# Patient Record
Sex: Female | Born: 1970 | Race: White | Hispanic: No | Marital: Married | Smoking: Former smoker
Health system: Southern US, Community
[De-identification: ages and names within clinical notes are randomized; demographics above are authoritative.]

## PROBLEM LIST (undated history)

## (undated) DIAGNOSIS — G510 Bell's palsy: Secondary | ICD-10-CM

## (undated) DIAGNOSIS — F419 Anxiety disorder, unspecified: Secondary | ICD-10-CM

## (undated) HISTORY — PX: FOOT SURGERY: SHX648

## (undated) HISTORY — PX: TONSILLECTOMY: SUR1361

---

## 2009-04-12 ENCOUNTER — Ambulatory Visit: Payer: Self-pay | Admitting: Internal Medicine

## 2010-05-27 ENCOUNTER — Ambulatory Visit: Payer: Self-pay | Admitting: Internal Medicine

## 2011-06-07 ENCOUNTER — Ambulatory Visit: Payer: Self-pay | Admitting: Internal Medicine

## 2011-06-13 ENCOUNTER — Ambulatory Visit: Payer: Self-pay

## 2012-10-17 DIAGNOSIS — F988 Other specified behavioral and emotional disorders with onset usually occurring in childhood and adolescence: Secondary | ICD-10-CM | POA: Insufficient documentation

## 2013-12-03 DIAGNOSIS — G4709 Other insomnia: Secondary | ICD-10-CM | POA: Insufficient documentation

## 2014-05-01 ENCOUNTER — Ambulatory Visit: Payer: Self-pay | Admitting: Emergency Medicine

## 2015-06-29 ENCOUNTER — Ambulatory Visit
Admission: EM | Admit: 2015-06-29 | Discharge: 2015-06-29 | Disposition: A | Discharge status: Hospice | Payer: BC Managed Care – PPO | Attending: Family Medicine | Admitting: Family Medicine

## 2015-06-29 ENCOUNTER — Encounter: Payer: Self-pay | Admitting: Emergency Medicine

## 2015-06-29 DIAGNOSIS — R05 Cough: Secondary | ICD-10-CM | POA: Diagnosis not present

## 2015-06-29 DIAGNOSIS — R059 Cough, unspecified: Secondary | ICD-10-CM

## 2015-06-29 HISTORY — DX: Bell's palsy: G51.0

## 2015-06-29 MED ORDER — AZITHROMYCIN 250 MG PO TABS: ORAL_TABLET | ORAL | Status: DC

## 2015-06-29 NOTE — ED Provider Notes (Signed)
CSN: [id number]     Arrival date & time 06/29/15  1042 History   First MD Initiated Contact with Patient 06/29/15 1216     Chief Complaint  Patient presents with  . Cough   (Consider location/radiation/quality/duration/timing/severity/associated sxs/prior Treatment) Patient is a 45 y.o. female presenting with cough and URI. The history is provided by the patient.  Cough Cough characteristics:  Productive Associated symptoms: no headaches and no wheezing   URI Presenting symptoms: cough and fatigue   Severity:  Moderate Onset quality:  Sudden Duration:  5 days Timing:  Constant Progression:  Worsening Chronicity:  New Relieved by:  Nothing Ineffective treatments:  OTC medications Associated symptoms: no arthralgias, no headaches, no sinus pain and no wheezing   Risk factors: sick contacts   Risk factors: not elderly, no chronic cardiac disease, no chronic kidney disease, no chronic respiratory disease, no diabetes mellitus, no immunosuppression, no recent illness and no recent travel     Past Medical History  Diagnosis Date  . Bell's palsy    Past Surgical History  Procedure Laterality Date  . Foot surgery Bilateral   . Cesarean section     History reviewed. No pertinent family history. Social History  Substance Use Topics  . Smoking status: Former Games developer  . Smokeless tobacco: None  . Alcohol Use: Yes   OB History    No data available     Review of Systems  Constitutional: Positive for fatigue.  Respiratory: Positive for cough. Negative for wheezing.   Musculoskeletal: Negative for arthralgias.  Neurological: Negative for headaches.    Allergies  Augmentin  Home Medications   Prior to Admission medications   Medication Sig Start Date End Date Taking? Authorizing Provider  amphetamine-dextroamphetamine (ADDERALL XR) 5 MG 24 hr capsule Take 5 mg by mouth daily.   Yes Historical Provider, MD  sertraline (ZOLOFT) 100 MG tablet Take 200 mg by mouth daily.   Yes  Historical Provider, MD  azithromycin (ZITHROMAX Z-PAK) 250 MG tablet 2 tabs po once day 1, then 1 tab po qd for next 4 days 06/29/15   Patricia Mccallum, MD   Meds Ordered and Administered this Visit  Medications - No data to display  BP 109/76 mmHg  Pulse 77  Temp(Src) 97 F (36.1 C) (Tympanic)  Resp 17  Ht  (1.575 m)  Wt 145 lb (65.772 kg)  BMI 26.51 kg/m2  SpO2 99%  LMP 06/26/2015 (Exact Date) No data found.   Physical Exam  Constitutional: She appears well-developed and well-nourished. No distress.  HENT:  Head: Normocephalic and atraumatic.  Right Ear: Tympanic membrane, external ear and ear canal normal.  Left Ear: Tympanic membrane, external ear and ear canal normal.  Nose: No mucosal edema, rhinorrhea, nose lacerations, sinus tenderness, nasal deformity, septal deviation or nasal septal hematoma. No epistaxis.  No foreign bodies.  Mouth/Throat: Uvula is midline, oropharynx is clear and moist and mucous membranes are normal. No oropharyngeal exudate.  Eyes: Conjunctivae and EOM are normal. Pupils are equal, round, and reactive to light. Right eye exhibits no discharge. Left eye exhibits no discharge. No scleral icterus.  Neck: Normal range of motion. Neck supple. No thyromegaly present.  Cardiovascular: Normal rate, regular rhythm and normal heart sounds.   Pulmonary/Chest: Effort normal and breath sounds normal. No respiratory distress. She has no wheezes. She has no rales.  Diffuse rhonchi  Lymphadenopathy:    She has no cervical adenopathy.  Skin: She is not diaphoretic.  Nursing note and vitals reviewed.  ED Course  Procedures (including critical care time)  Labs Review Labs Reviewed - No data to display  Imaging Review No results found.   Visual Acuity Review  Right Eye Distance:   Left Eye Distance:   Bilateral Distance:    Right Eye Near:   Left Eye Near:    Bilateral Near:         MDM   1. Cough    Discharge Medication List as of  06/29/2015 12:35 PM    START taking these medications   Details  azithromycin (ZITHROMAX Z-PAK) 250 MG tablet 2 tabs po once day 1, then 1 tab po qd for next 4 days, Normal       1. Labs/x-ray results and diagnosis reviewed with patient 2. rx as per orders above; reviewed possible side effects, interactions, risks and benefits  3. Recommend supportive treatment with increased fluids, otc analgesics, rest 4. Follow-up prn if symptoms worsen or don't improve    Patricia Mccallumrlando Batya Citron, MD 06/29/15 1303

## 2015-06-29 NOTE — ED Notes (Signed)
Patient c/o cough and chest congestion, SOB that started Thursday.  Patient reports fevers.

## 2016-03-01 ENCOUNTER — Ambulatory Visit
Admission: EM | Admit: 2016-03-01 | Discharge: 2016-03-01 | Disposition: A | Discharge status: Home / Self Care | Payer: BC Managed Care – PPO | Attending: Emergency Medicine | Admitting: Emergency Medicine

## 2016-03-01 ENCOUNTER — Encounter: Payer: Self-pay | Admitting: *Deleted

## 2016-03-01 DIAGNOSIS — J0100 Acute maxillary sinusitis, unspecified: Secondary | ICD-10-CM

## 2016-03-01 DIAGNOSIS — J029 Acute pharyngitis, unspecified: Secondary | ICD-10-CM | POA: Diagnosis not present

## 2016-03-01 LAB — RAPID STREP SCREEN (MED CTR MEBANE ONLY): Streptococcus, Group A Screen (Direct): NEGATIVE

## 2016-03-01 MED ORDER — IPRATROPIUM BROMIDE 0.06 % NA SOLN: 2.0000 | Freq: Four times a day (QID) | NASAL | 0 refills | Status: DC

## 2016-03-01 MED ORDER — IBUPROFEN 800 MG PO TABS: 800.0000 mg | ORAL_TABLET | Freq: Three times a day (TID) | ORAL | 0 refills | Status: DC

## 2016-03-01 MED ORDER — AZITHROMYCIN 500 MG PO TABS: 500.0000 mg | ORAL_TABLET | Freq: Every day | ORAL | 0 refills | Status: AC

## 2016-03-01 NOTE — ED Triage Notes (Signed)
Sore throat, fever, productive cough- yellow/bloody, body aches, chest soreness with coughing, since last week. Lost her voice yesterday.

## 2016-03-01 NOTE — Discharge Instructions (Signed)
your rapid strep was negative today, so we have sent off a throat culture.  1 gram of  Tylenol and ibuprofen together as needed for pain.  Make sure you drink plenty of extra fluids.  Some people find salt water gargles and  Traditional Medicinal's "Throat Coat" tea helpful. Take 5 mL of liquid Benadryl and 5 mL of Maalox. Mix it together, and then hold it in your mouth for as long as you can and then swallow. You may do this 4 times a day.  '  Start some saline nasal irrigation help with the nasal congestion and postnasal drip. Atrovent nasal spray will help with this as well.

## 2016-03-01 NOTE — ED Provider Notes (Signed)
HPI  SUBJECTIVE:  Patricia Patton is a 45 y.o. female who presents with 6 days of sore throat, laryngitis, nasal congestion, thick yellow rhinorrhea, postnasal drip, fevers to MAXIMUM TEMPERATURE 101, mild headaches. She states that she was overall getting better, but states her throat got acutely worse today. She reports sinus pain and pressure but states that this has since resolved. She reports cough productive of yellowish material same as her nasal congestion. She denies wheezing, chest tightness, shortness of breath. She reports chest soreness to the coughing. Bodyaches starting today. No difficulty breathing, sensation of throat swelling shut, drooling, trismus. No exposure to contacts with flu, mono, strep. States that she is sleeping okay at night. She tried Advil sinus Cepacol salt water gargles, with improvement in her symptoms, . There are no aggravating factors. No history of asthma, emphysema, COPD, diabetes, hypertension. She has a history of recurrent strep throat with negative rapid streps. LMP: "Months". She denies the chance of being pregnant. States that she is perimenopausal. PMD: [facility].    Past Medical History:  Diagnosis Date  . Bell's palsy     Past Surgical History:  Procedure Laterality Date  . CESAREAN SECTION    . FOOT SURGERY Bilateral     History reviewed. No pertinent family history.  Social History  Substance Use Topics  . Smoking status: Former Games developermoker  . Smokeless tobacco: Never Used  . Alcohol use Yes    No current facility-administered medications for this encounter.   Current Outpatient Prescriptions:  .  sertraline (ZOLOFT) 100 MG tablet, Take 200 mg by mouth daily., Disp: , Rfl:  .  azithromycin (ZITHROMAX) 500 MG tablet, Take 1 tablet (500 mg total) by mouth daily., Disp: 5 tablet, Rfl: 0 .  ibuprofen (ADVIL,MOTRIN) 800 MG tablet, Take 1 tablet (800 mg total) by mouth 3 (three) times daily., Disp: 30 tablet, Rfl: 0 .   ipratropium (ATROVENT) 0.06 % nasal spray, Place 2 sprays into both nostrils 4 (four) times daily. 3-4 times/ day, Disp: 15 mL, Rfl: 0  Allergies  Allergen Reactions  . Augmentin [Amoxicillin-Pot Clavulanate] Hives     ROS  As noted in HPI.   Physical Exam  BP 117/80 (BP Location: Left Arm)   Pulse 79   Temp 97.9 F (36.6 C) (Oral)   Resp 16   Ht 5\' 2"  (1.575 m)   Wt 145 lb (65.8 kg)   SpO2 98%   BMI 26.52 kg/m   Constitutional: Well developed, well nourished, no acute distress Eyes:  EOMI, conjunctiva normal bilaterally HENT: Normocephalic, atraumatic,mucus membranes moist. TM normal bilaterally. + Mucopurulent nasal d/c, + maxillary ST, red swollen erythematous tonsil R side with no soft palate tenderness or swelling. No exudate. Uvula midline. Positives shotty cervical lymphadenopathy Respiratory: Normal inspiratory effort, lungs clear bilaterally good air movement  Cardiovascular: Normal rate Regular rhythm, no murmurs rubs gallops  GI: nondistended skin: No rash, skin intact Musculoskeletal: no deformities Neurologic: Alert & oriented x 3, no focal neuro deficits Psychiatric: Speech and behavior appropriate   ED Course   Medications - No data to display  Orders Placed This Encounter  Procedures  . Rapid strep screen    Standing Status:   Standing    Number of Occurrences:   1    Order Specific Question:   Patient immune status    Answer:   Normal  . Culture, group A strep    Standing Status:   Standing    Number of Occurrences:  1    Results for orders placed or performed during the hospital encounter of 03/01/16 (from the past 24 hour(s))  Rapid strep screen     Status: None   Collection Time: 03/01/16  8:34 AM  Result Value Ref Range   Streptococcus, Group A Screen (Direct) NEGATIVE NEGATIVE   No results found.  ED Clinical Impression  Pharyngitis, unspecified etiology  Acute maxillary sinusitis, recurrence not specified   ED  Assessment/Plan  Rapid strep negative. Throat culture sent. However, because patient states that she gets recurrent strep throat with negative rapid streps, we'll treat empirically for strep throat azithromycin 500 mg daily for 5 days especially with fever and double sickening. Will also Cover for sinusitis with her sinus tenderness. Does not appear to be any evidence of retropharyngeal abscess or peritonsillar abscess at this time. Also supportive treatment with daily nasal irrigation, ibuprofen 800 mg with 1 g Tylenol 3 times a day, Benadryl/Maalox mixture. Follow-up with PMD as needed. To the ER if gets worse. Discussed labs MDM, plan and followup with patient. Discussed sn/sx that should prompt return to the ED. Patient agrees with plan.   Meds ordered this encounter  Medications  . ibuprofen (ADVIL,MOTRIN) 800 MG tablet    Sig: Take 1 tablet (800 mg total) by mouth 3 (three) times daily.    Dispense:  30 tablet    Refill:  0  . azithromycin (ZITHROMAX) 500 MG tablet    Sig: Take 1 tablet (500 mg total) by mouth daily.    Dispense:  5 tablet    Refill:  0  . ipratropium (ATROVENT) 0.06 % nasal spray    Sig: Place 2 sprays into both nostrils 4 (four) times daily. 3-4 times/ day    Dispense:  15 mL    Refill:  0    *This clinic note was created using Scientist, clinical (histocompatibility and immunogenetics)Dragon dictation software. Therefore, there may be occasional mistakes despite careful proofreading.  ?   Domenick GongAshley Billie Intriago, MD 03/01/16 [phone]

## 2016-03-03 LAB — CULTURE, GROUP A STREP (THRC)

## 2016-03-14 DIAGNOSIS — J020 Streptococcal pharyngitis: Secondary | ICD-10-CM

## 2016-03-14 HISTORY — DX: Streptococcal pharyngitis: J02.0

## 2016-04-15 ENCOUNTER — Ambulatory Visit
Admission: EM | Admit: 2016-04-15 | Discharge: 2016-04-15 | Disposition: A | Discharge status: Home / Self Care | Payer: BC Managed Care – PPO | Attending: Internal Medicine | Admitting: Internal Medicine

## 2016-04-15 ENCOUNTER — Encounter: Payer: Self-pay | Admitting: Emergency Medicine

## 2016-04-15 DIAGNOSIS — R6889 Other general symptoms and signs: Secondary | ICD-10-CM | POA: Diagnosis not present

## 2016-04-15 MED ORDER — OSELTAMIVIR PHOSPHATE 75 MG PO CAPS: 75.0000 mg | ORAL_CAPSULE | Freq: Two times a day (BID) | ORAL | 0 refills | Status: DC

## 2016-04-15 MED ORDER — CETIRIZINE-PSEUDOEPHEDRINE ER 5-120 MG PO TB12: 1.0000 | ORAL_TABLET | Freq: Two times a day (BID) | ORAL | 0 refills | Status: DC

## 2016-04-15 MED ORDER — BENZONATATE 200 MG PO CAPS: 200.0000 mg | ORAL_CAPSULE | Freq: Three times a day (TID) | ORAL | 0 refills | Status: DC | PRN

## 2016-04-15 NOTE — ED Triage Notes (Signed)
Pt reports flu like symptoms including cough, congestion, body aches, and fever that started last night

## 2016-04-15 NOTE — ED Provider Notes (Signed)
CSN: [id number]     Arrival date & time 04/15/16  0919 History   First MD Initiated Contact with Patient 04/15/16 1023     Chief Complaint  Patient presents with  . Influenza   (Consider location/radiation/quality/duration/timing/severity/associated sxs/prior Treatment) HPI  Is a 46 year old female who presents with a sudden onset of coughing gesture and body aches and fever to 101 this started last night. She did receive a flu shot in October. Also complaining of bladder fullness but not having to pass urine. She states it is not a UTI she is very familiar with those symptoms.       Past Medical History:  Diagnosis Date  . Bell's palsy    Past Surgical History:  Procedure Laterality Date  . CESAREAN SECTION    . FOOT SURGERY Bilateral    No family history on file. Social History  Substance Use Topics  . Smoking status: Former Games developermoker  . Smokeless tobacco: Never Used  . Alcohol use Yes   OB History    No data available     Review of Systems  Constitutional: Positive for activity change, chills, fatigue and fever.  HENT: Positive for congestion, postnasal drip, sinus pain and sinus pressure.   Respiratory: Positive for cough. Negative for shortness of breath, wheezing and stridor.   Genitourinary: Positive for decreased urine volume and urgency.  All other systems reviewed and are negative.   Allergies  Augmentin [amoxicillin-pot clavulanate]  Home Medications   Prior to Admission medications   Medication Sig Start Date End Date Taking? Authorizing Provider  benzonatate (TESSALON) 200 MG capsule Take 1 capsule (200 mg total) by mouth 3 (three) times daily as needed for cough. 04/15/16   Lutricia FeilWilliam P Roemer, PA-C  cetirizine-pseudoephedrine (ZYRTEC-D) 5-120 MG tablet Take 1 tablet by mouth 2 (two) times daily. 04/15/16   Lutricia FeilWilliam P Roemer, PA-C  ibuprofen (ADVIL,MOTRIN) 800 MG tablet Take 1 tablet (800 mg total) by mouth 3 (three) times daily. 03/01/16   Domenick GongAshley Mortenson, MD   ipratropium (ATROVENT) 0.06 % nasal spray Place 2 sprays into both nostrils 4 (four) times daily. 3-4 times/ day 03/01/16   Domenick GongAshley Mortenson, MD  oseltamivir (TAMIFLU) 75 MG capsule Take 1 capsule (75 mg total) by mouth every 12 (twelve) hours. 04/15/16   Lutricia FeilWilliam P Roemer, PA-C  sertraline (ZOLOFT) 100 MG tablet Take 200 mg by mouth daily.    Historical Provider, MD   Meds Ordered and Administered this Visit  Medications - No data to display  BP 123/76 (BP Location: Left Arm)   Pulse 84   Temp 99.9 F (37.7 C) (Oral)   Resp 16   Ht 5\' 2"  (1.575 m)   Wt 145 lb (65.8 kg)   SpO2 100%   BMI 26.52 kg/m  No data found.   Physical Exam  Constitutional: She is oriented to person, place, and time. She appears well-developed and well-nourished. No distress.  HENT:  Head: Normocephalic and atraumatic.  Right Ear: External ear normal.  Left Ear: External ear normal.  Nose: Nose normal.  Mouth/Throat: Oropharynx is clear and moist. No oropharyngeal exudate.  Eyes: EOM are normal. Pupils are equal, round, and reactive to light. Right eye exhibits no discharge. Left eye exhibits no discharge.  Neck: Normal range of motion. Neck supple.  Pulmonary/Chest: Effort normal and breath sounds normal. No respiratory distress. She has no wheezes. She has no rales.  Musculoskeletal: Normal range of motion.  Lymphadenopathy:    She has no cervical adenopathy.  Neurological:  She is alert and oriented to person, place, and time.  Skin: Skin is warm and dry. She is not diaphoretic.  Psychiatric: She has a normal mood and affect. Her behavior is normal. Judgment and thought content normal.  Nursing note and vitals reviewed.   Urgent Care Course     Procedures (including critical care time)  Labs Review Labs Reviewed - No data to display  Imaging Review No results found.   Visual Acuity Review  Right Eye Distance:   Left Eye Distance:   Bilateral Distance:    Right Eye Near:   Left Eye  Near:    Bilateral Near:         MDM   1. Flu-like symptoms    Discharge Medication List as of 04/15/2016 10:38 AM    START taking these medications   Details  benzonatate (TESSALON) 200 MG capsule Take 1 capsule (200 mg total) by mouth 3 (three) times daily as needed for cough., Starting Fri 04/15/2016, Normal    cetirizine-pseudoephedrine (ZYRTEC-D) 5-120 MG tablet Take 1 tablet by mouth 2 (two) times daily., Starting Fri 04/15/2016, Normal    oseltamivir (TAMIFLU) 75 MG capsule Take 1 capsule (75 mg total) by mouth every 12 (twelve) hours., Starting Fri 04/15/2016, Normal      Plan: 1. Test/x-ray results and diagnosis reviewed with patient 2. rx as per orders; risks, benefits, potential side effects reviewed with patient 3. Recommend supportive treatment with Rest and fluids. Tylenol or Motrin for aches pains and fever. Recommended the Zyrtec-D for congestion. Do not return to work until fever has subsided. 4. F/u prn if symptoms worsen or don't improve     Lutricia Feil, PA-C 04/15/16 1051

## 2016-08-14 ENCOUNTER — Ambulatory Visit (INDEPENDENT_AMBULATORY_CARE_PROVIDER_SITE_OTHER): Payer: BC Managed Care – PPO

## 2016-08-14 ENCOUNTER — Encounter: Payer: Self-pay | Admitting: *Deleted

## 2016-08-14 ENCOUNTER — Ambulatory Visit
Admission: EM | Admit: 2016-08-14 | Discharge: 2016-08-14 | Disposition: A | Discharge status: Home / Self Care | Payer: BC Managed Care – PPO | Attending: Family Medicine | Admitting: Family Medicine

## 2016-08-14 DIAGNOSIS — S93401A Sprain of unspecified ligament of right ankle, initial encounter: Secondary | ICD-10-CM

## 2016-08-14 MED ORDER — NAPROXEN 500 MG PO TABS: 500.0000 mg | ORAL_TABLET | Freq: Two times a day (BID) | ORAL | 0 refills | Status: DC

## 2016-08-14 NOTE — ED Provider Notes (Signed)
CSN: [id number]     Arrival date & time 08/14/16  1108 History   First MD Initiated Contact with Patient 08/14/16 1149     Chief Complaint  Patient presents with  . Ankle Pain   (Consider location/radiation/quality/duration/timing/severity/associated sxs/prior Treatment) Patient is a 46 year old female who presents with complaint of right ankle pain. Patient states she twisted her ankle about a week ago and that her pain swelling had actually improved. She had been taking aspirin for the pain. Patient reports, however that the pain returned today and that  it is hurting to walk. Patient does report throbbing pain at rest. Patient states she has been using elevation, ice, perhaps, and rest for her ankle pain.       Past Medical History:  Diagnosis Date  . Bell's palsy    Past Surgical History:  Procedure Laterality Date  . CESAREAN SECTION    . FOOT SURGERY Bilateral    History reviewed. No pertinent family history. Social History  Substance Use Topics  . Smoking status: Former Games developermoker  . Smokeless tobacco: Never Used  . Alcohol use Yes   OB History    No data available     Review of Systems  Musculoskeletal: Positive for arthralgias and joint swelling.       As noted above in history of present illness  All other systems reviewed and are negative.   Allergies  Augmentin [amoxicillin-pot clavulanate]  Home Medications   Prior to Admission medications   Medication Sig Start Date End Date Taking? Authorizing Provider  sertraline (ZOLOFT) 100 MG tablet Take 200 mg by mouth daily.   Yes [provider]  benzonatate (TESSALON) 200 MG capsule Take 1 capsule (200 mg total) by mouth 3 (three) times daily as needed for cough. 04/15/16   Lutricia Feiloemer, William P, PA-C  cetirizine-pseudoephedrine (ZYRTEC-D) 5-120 MG tablet Take 1 tablet by mouth 2 (two) times daily. 04/15/16   Lutricia Feiloemer, William P, PA-C  ibuprofen (ADVIL,MOTRIN) 800 MG tablet Take 1 tablet (800 mg total) by mouth 3  (three) times daily. 03/01/16   Domenick GongMortenson, Ashley, MD  ipratropium (ATROVENT) 0.06 % nasal spray Place 2 sprays into both nostrils 4 (four) times daily. 3-4 times/ day 03/01/16   Domenick GongMortenson, Ashley, MD  naproxen (NAPROSYN) 500 MG tablet Take 1 tablet (500 mg total) by mouth 2 (two) times daily with a meal. 08/14/16   Candis SchatzHarris, Kennth Vanbenschoten D, PA-C  oseltamivir (TAMIFLU) 75 MG capsule Take 1 capsule (75 mg total) by mouth every 12 (twelve) hours. 04/15/16   Lutricia Feiloemer, William P, PA-C   Meds Ordered and Administered this Visit  Medications - No data to display  BP 118/82 (BP Location: Left Arm)   Pulse 78   Temp 99.2 F (37.3 C) (Oral)   Resp 18   Ht 5\' 2"  (1.575 m)   Wt 150 lb (68 kg)   LMP 06/14/2016   SpO2 98%   BMI 27.44 kg/m  No data found.   Physical Exam  Constitutional: She appears well-developed and well-nourished.  HENT:  Head: Normocephalic and atraumatic.  Eyes: EOM are normal. Pupils are equal, round, and reactive to light.  Neck: Normal range of motion.  Cardiovascular: Normal rate.   Pulmonary/Chest: Effort normal.  Musculoskeletal:       Right ankle: She exhibits swelling. Tenderness. Lateral malleolus tenderness found. Achilles tendon normal. Achilles tendon exhibits no pain and no defect.       Feet:    Urgent Care Course     Procedures (including  critical care time)  Labs Review Labs Reviewed - No data to display  Imaging Review Dg Ankle Complete Right  Result Date: 08/14/2016 CLINICAL DATA:  Right ankle pain following fall 1 week ago. Initial encounter. EXAM: RIGHT ANKLE - COMPLETE 3+ VIEW COMPARISON:  None. FINDINGS: There is no evidence of fracture, dislocation, or joint effusion. There is no evidence of arthropathy or other focal bone abnormality. Soft tissues are unremarkable. A surgical screw within the first metatarsal is noted. IMPRESSION: No acute abnormality. Electronically Signed   By: Harmon Pier M.D.   On: 08/14/2016 12:26        MDM   1. Sprain  of right ankle, unspecified ligament, initial encounter    New Prescriptions   NAPROXEN (NAPROSYN) 500 MG TABLET    Take 1 tablet (500 mg total) by mouth 2 (two) times daily with a meal.   Patient with ankle sprain based on exam with tenderness to lateral malleolus associated ligaments. Xray negative for fracture. RICE. Stirrup brace placed. Prescription given for naproxen sodium for pain.  Candis Schatz, PA-C      Candis Schatz, PA-C 08/14/16 1254

## 2016-08-14 NOTE — ED Triage Notes (Signed)
Patient stepped into a hole twisting her right ankle 1 week ago. Swelling has resolved, with additional symptom of sharp pain starting today.

## 2016-08-14 NOTE — ED Notes (Signed)
ASO brace applied to right ankle

## 2016-08-14 NOTE — Discharge Instructions (Signed)
-  continue with ice, elevation and rest -naproxen twice daily as needed for pain -see attached for brace

## 2017-07-02 ENCOUNTER — Other Ambulatory Visit: Payer: Self-pay

## 2017-07-02 ENCOUNTER — Ambulatory Visit
Admission: EM | Admit: 2017-07-02 | Discharge: 2017-07-02 | Disposition: A | Discharge status: Home / Self Care | Payer: BC Managed Care – PPO | Attending: Emergency Medicine | Admitting: Emergency Medicine

## 2017-07-02 ENCOUNTER — Encounter: Payer: Self-pay | Admitting: Gynecology

## 2017-07-02 DIAGNOSIS — J029 Acute pharyngitis, unspecified: Secondary | ICD-10-CM

## 2017-07-02 DIAGNOSIS — R110 Nausea: Secondary | ICD-10-CM

## 2017-07-02 DIAGNOSIS — R109 Unspecified abdominal pain: Secondary | ICD-10-CM | POA: Diagnosis not present

## 2017-07-02 DIAGNOSIS — J020 Streptococcal pharyngitis: Secondary | ICD-10-CM | POA: Diagnosis not present

## 2017-07-02 HISTORY — DX: Anxiety disorder, unspecified: F41.9

## 2017-07-02 LAB — RAPID STREP SCREEN (MED CTR MEBANE ONLY): Streptococcus, Group A Screen (Direct): POSITIVE — AB

## 2017-07-02 MED ORDER — ONDANSETRON 8 MG PO TBDP
8.0000 mg | ORAL_TABLET | Freq: Once | ORAL | Status: AC
  Administered 2017-07-02: 8 mg via ORAL

## 2017-07-02 MED ORDER — ACETAMINOPHEN 325 MG PO TABS
650.0000 mg | ORAL_TABLET | Freq: Once | ORAL | Status: AC
  Administered 2017-07-02: 650 mg via ORAL

## 2017-07-02 MED ORDER — AZITHROMYCIN 250 MG PO TABS: 250.0000 mg | ORAL_TABLET | Freq: Every day | ORAL | 0 refills | Status: DC

## 2017-07-02 MED ORDER — ONDANSETRON 8 MG PO TBDP: 8.0000 mg | ORAL_TABLET | Freq: Two times a day (BID) | ORAL | 0 refills | Status: DC

## 2017-07-02 NOTE — ED Triage Notes (Signed)
Patient c/o painful to swallow and stomach pain x yesterday.

## 2017-07-02 NOTE — ED Provider Notes (Signed)
[facility]    CSN: [id number] Arrival date & time: 07/02/17  0848     History   Chief Complaint Chief Complaint  Patient presents with  . Sore Throat    HPI Patricia Patton is a 47 y.o. female.   HPI  47 year old female who presents with sore throat very painful to swallow and abdominal pain she states mostly lower quadrant both left and right.  This started yesterday.  Her 48-year-old daughter had been sick with a fever but no other symptoms 2 days prior.  Also been around a child that had been confirmed influenza B last week.  He has had nausea but no vomiting has not had any diarrhea.  Her last bowel movement was yesterday which was normal.  She is passing gas without problem.  Vital signs temperature 100.2 pulse rate 110 blood pressure 110/75 respirations 16 O2 sats at 96% on room air         Past Medical History:  Diagnosis Date  . Anxiety   . Bell's palsy     There are no active problems to display for this patient.   Past Surgical History:  Procedure Laterality Date  . CESAREAN SECTION    . FOOT SURGERY Bilateral     OB History   None      Home Medications    Prior to Admission medications   Medication Sig Start Date End Date Taking? Authorizing Provider  cetirizine-pseudoephedrine (ZYRTEC-D) 5-120 MG tablet Take 1 tablet by mouth 2 (two) times daily. 04/15/16  Yes Lutricia Feil, PA-C  ibuprofen (ADVIL,MOTRIN) 800 MG tablet Take 1 tablet (800 mg total) by mouth 3 (three) times daily. 03/01/16  Yes Domenick Gong, MD  ipratropium (ATROVENT) 0.06 % nasal spray Place 2 sprays into both nostrils 4 (four) times daily. 3-4 times/ day 03/01/16  Yes Domenick Gong, MD  naproxen (NAPROSYN) 500 MG tablet Take 1 tablet (500 mg total) by mouth 2 (two) times daily with a meal. 08/14/16  Yes Candis Schatz, PA-C  sertraline (ZOLOFT) 100 MG tablet Take 200 mg by mouth daily.   Yes [provider]  azithromycin (ZITHROMAX) 250 MG  tablet Take 1 tablet (250 mg total) by mouth daily. Take first 2 tablets together, then 1 every day until finished. 07/02/17   Lutricia Feil, PA-C  ondansetron (ZOFRAN ODT) 8 MG disintegrating tablet Take 1 tablet (8 mg total) by mouth 2 (two) times daily. 07/02/17   Lutricia Feil, PA-C    Family History Family History  Problem Relation Age of Onset  . Diabetes Mother   . Diabetes Father     Social History Social History   Tobacco Use  . Smoking status: Former Games developer  . Smokeless tobacco: Never Used  Substance Use Topics  . Alcohol use: Yes  . Drug use: No     Allergies   Augmentin [amoxicillin-pot clavulanate]   Review of Systems Review of Systems  Constitutional: Positive for activity change, appetite change, chills, fatigue and fever.  HENT: Positive for sore throat and trouble swallowing.   Gastrointestinal: Positive for abdominal distention, abdominal pain and nausea. Negative for constipation, diarrhea and vomiting.  All other systems reviewed and are negative.    Physical Exam Triage Vital Signs ED Triage Vitals  Enc Vitals Group     BP 07/02/17 0858 110/75     Pulse Rate 07/02/17 0858 (!) 110     Resp 07/02/17 0858 16     Temp 07/02/17 0858 100.2  F (37.9 C)     Temp Source 07/02/17 0858 Oral     SpO2 07/02/17 0858 96 %     Weight 07/02/17 0858 150 lb (68 kg)     Height 07/02/17 0858 5\' 2"  (1.575 m)     Head Circumference --      Peak Flow --      Pain Score 07/02/17 0900 8     Pain Loc --      Pain Edu? --      Excl. in GC? --    No data found.  Updated Vital Signs BP 110/75 (BP Location: Left Arm)   Pulse (!) 110   Temp 100.2 F (37.9 C) (Oral)   Resp 16   Ht 5\' 2"  (1.575 m)   Wt 150 lb (68 kg)   SpO2 96%   BMI 27.44 kg/m   Visual Acuity Right Eye Distance:   Left Eye Distance:   Bilateral Distance:    Right Eye Near:   Left Eye Near:    Bilateral Near:     Physical Exam  Constitutional: She is oriented to person, place,  and time. She appears well-developed and well-nourished.  Non-toxic appearance. She does not appear ill. No distress.  HENT:  Head: Normocephalic.  Right Ear: Hearing and tympanic membrane normal.  Left Ear: Hearing and tympanic membrane normal.  Mouth/Throat: Mucous membranes are normal. No oral lesions. Oropharyngeal exudate and posterior oropharyngeal erythema present. No tonsillar abscesses. Tonsils are 1+ on the right. Tonsils are 1+ on the left. Tonsillar exudate.  Uvula is midline.  There is no pharyngeal swelling no peritonsillar abscess is identified  Eyes: Pupils are equal, round, and reactive to light.  Neck: Normal range of motion. Neck supple.  Pulmonary/Chest: Effort normal and breath sounds normal.  Abdominal: Soft. Bowel sounds are normal. She exhibits distension. There is tenderness. There is no rebound and no guarding.  Neurological: She is alert and oriented to person, place, and time.  Skin: Skin is warm and dry.  Psychiatric: She has a normal mood and affect. Her behavior is normal.  Nursing note and vitals reviewed.    UC Treatments / Results  Labs (all labs ordered are listed, but only abnormal results are displayed) Labs Reviewed  RAPID STREP SCREEN ([facility]) - Abnormal; Notable for the following components:      Result Value   Streptococcus, Group A Screen (Direct) POSITIVE (*)    All other components within normal limits    EKG None Radiology No results found.  Procedures Procedures (including critical care time)  Medications Ordered in UC Medications  ondansetron (ZOFRAN-ODT) disintegrating tablet 8 mg (8 mg Oral Given 07/02/17 0921)  acetaminophen (TYLENOL) tablet 650 mg (650 mg Oral Given 07/02/17 0921)     Initial Impression / Assessment and Plan / UC Course  I have reviewed the triage vital signs and the nursing notes.  Pertinent labs & imaging results that were available during my care of the patient were reviewed by me and considered  in my medical decision making (see chart for details).     Plan: 1. Test/x-ray results and diagnosis reviewed with patient 2. rx as per orders; risks, benefits, potential side effects reviewed with patient 3. Recommend supportive treatment with increased fluid.  She refused prednisone for the throat pain.  Since she has the allergy to amoxicillin  she has stated that she has taken other forms of penicillin I will provide her with azithromycin Z-Pak.  She  has had this in the past without any complications.  Zofran for nausea so that she may hydrate.  She may also use salt water gargles ad lib. or lozenges.  If she is not improving in 2 days have recommended she follow-up with her primary care physician or return to our clinic. 4. F/u prn if symptoms worsen or don't improve   Final Clinical Impressions(s) / UC Diagnoses   Final diagnoses:  Strep throat    ED Discharge Orders        Ordered    ondansetron (ZOFRAN ODT) 8 MG disintegrating tablet  2 times daily     07/02/17 0938    azithromycin (ZITHROMAX) 250 MG tablet  Daily     07/02/17 0938       Controlled Substance Prescriptions [place] Controlled Substance Registry consulted? Not Applicable   Lutricia Feil, PA-C 07/02/17 1610

## 2017-07-02 NOTE — Discharge Instructions (Addendum)
Drink plenty of fluids.  Use Motrin for fever sore throat and body aches.  If you have not improved in 2 days.  Recommend you follow-up with your primary care physician.

## 2018-06-27 DIAGNOSIS — F340 Cyclothymic disorder: Secondary | ICD-10-CM | POA: Insufficient documentation

## 2018-09-26 DIAGNOSIS — G4733 Obstructive sleep apnea (adult) (pediatric): Secondary | ICD-10-CM | POA: Insufficient documentation

## 2018-09-30 ENCOUNTER — Emergency Department
Admission: EM | Admit: 2018-09-30 | Discharge: 2018-09-30 | Disposition: A | Discharge status: Home / Self Care | Payer: BC Managed Care – PPO | Attending: Emergency Medicine | Admitting: Emergency Medicine

## 2018-09-30 ENCOUNTER — Ambulatory Visit: Payer: Self-pay

## 2018-09-30 ENCOUNTER — Encounter: Payer: Self-pay | Admitting: Emergency Medicine

## 2018-09-30 ENCOUNTER — Other Ambulatory Visit: Payer: Self-pay

## 2018-09-30 ENCOUNTER — Emergency Department: Payer: BC Managed Care – PPO

## 2018-09-30 DIAGNOSIS — R05 Cough: Secondary | ICD-10-CM | POA: Diagnosis not present

## 2018-09-30 DIAGNOSIS — Z79899 Other long term (current) drug therapy: Secondary | ICD-10-CM | POA: Diagnosis not present

## 2018-09-30 DIAGNOSIS — Z20828 Contact with and (suspected) exposure to other viral communicable diseases: Secondary | ICD-10-CM | POA: Insufficient documentation

## 2018-09-30 DIAGNOSIS — Z87891 Personal history of nicotine dependence: Secondary | ICD-10-CM | POA: Insufficient documentation

## 2018-09-30 DIAGNOSIS — R0602 Shortness of breath: Secondary | ICD-10-CM | POA: Insufficient documentation

## 2018-09-30 DIAGNOSIS — R0789 Other chest pain: Secondary | ICD-10-CM | POA: Diagnosis present

## 2018-09-30 DIAGNOSIS — R059 Cough, unspecified: Secondary | ICD-10-CM

## 2018-09-30 LAB — BASIC METABOLIC PANEL
Anion gap: 7 (ref 5–15)
BUN: 16 mg/dL (ref 6–20)
CO2: 29 mmol/L (ref 22–32)
Calcium: 9.1 mg/dL (ref 8.9–10.3)
Chloride: 108 mmol/L (ref 98–111)
Creatinine, Ser: 0.76 mg/dL (ref 0.44–1.00)
GFR calc Af Amer: >60 mL/min (ref >60)
GFR calc non Af Amer: >60 mL/min (ref >60)
Glucose, Bld: 113 mg/dL — ABNORMAL HIGH (ref 70–99)
Potassium: 4.2 mmol/L (ref 3.5–5.1)
Sodium: 144 mmol/L (ref 135–145)

## 2018-09-30 LAB — CBC
HCT: 39.7 % (ref 36.0–46.0)
Hemoglobin: 13.4 g/dL (ref 12.0–15.0)
MCH: 29.6 pg (ref 26.0–34.0)
MCHC: 33.8 g/dL (ref 30.0–36.0)
MCV: 87.8 fL (ref 80.0–100.0)
Platelets: 253 10*3/uL (ref 150–400)
RBC: 4.52 MIL/uL (ref 3.87–5.11)
RDW: 12.2 % (ref 11.5–15.5)
WBC: 5 10*3/uL (ref 4.0–10.5)
nRBC: 0 % (ref 0.0–0.2)

## 2018-09-30 LAB — URINALYSIS, ROUTINE W REFLEX MICROSCOPIC
Bilirubin Urine: NEGATIVE
Glucose, UA: NEGATIVE mg/dL
Ketones, ur: 5 mg/dL — AB
Leukocytes,Ua: NEGATIVE
Nitrite: NEGATIVE
Protein, ur: NEGATIVE mg/dL
Specific Gravity, Urine: 1.023 (ref 1.005–1.030)
pH: 5 (ref 5.0–8.0)

## 2018-09-30 LAB — TROPONIN I (HIGH SENSITIVITY): Troponin I (High Sensitivity): <2 ng/L (ref <18)

## 2018-09-30 LAB — PREGNANCY, URINE: Preg Test, Ur: NEGATIVE

## 2018-09-30 MED ORDER — KETOROLAC TROMETHAMINE 30 MG/ML IJ SOLN
30.0000 mg | Freq: Once | INTRAMUSCULAR | Status: AC
  Administered 2018-09-30: 16:00:00 30 mg via INTRAMUSCULAR
  Filled 2018-09-30: qty 1

## 2018-09-30 NOTE — ED Triage Notes (Signed)
Pt arrived via POV with reports of severe chest pain that started 2 weeks ago, and then congestion, diarrhea, mild headache and nausea that started yesterday.  Pt has had no known COVID contacts. Pt wearing mask on arrival.

## 2018-09-30 NOTE — Telephone Encounter (Signed)
Pt c/o of few weeks of covid symptoms, constant chest pain located at the center of her chest, fatigue, Diarrhea, nasal congestion, sore throat. Denies fever. Dry cough. Pt c/o SOB at rest. Pt denies any radiation of chest pain to jaw, left ar or neck. Pt given care advice and pt verbalized understanding.  Called angela who is Camera operator at [facility] and asked for pt instruction. Was advised to have pt park and to come in and make sure she is wearing a mask. Pt advised to go to ED alone. Pt verbalized understanding.      Reason for Disposition . SEVERE or constant chest pain or pressure (Exception: mild central chest pain, present only when coughing)  Answer Assessment - Initial Assessment Questions 1. COVID-19 DIAGNOSIS: "Who made your Coronavirus (COVID-19) diagnosis?" "Was it confirmed by a positive lab test?" If not diagnosed by a HCP, ask "Are there lots of cases (community spread) where you live?" (See public health department website, if unsure)     N/a-no-yes 2. ONSET: "When did the COVID-19 symptoms start?"      A few weeks ago 3. WORST SYMPTOM: "What is your worst symptom?" (e.g., cough, fever, shortness of breath, muscle aches)     Chest pain-mid chest, hurts with deep breath,constant 8/10 4. COUGH: "Do you have a cough?" If so, ask: "How bad is the cough?"       Yes-dry- occasional 5. FEVER: "Do you have a fever?" If so, ask: "What is your temperature, how was it measured, and when did it start?"     no 6. RESPIRATORY STATUS: "Describe your breathing?" (e.g., shortness of breath, wheezing, unable to speak)      SOB at rest, wheezing 7. BETTER-SAME-WORSE: "Are you getting better, staying the same or getting worse compared to yesterday?"  If getting worse, ask, "In what way?"     Worse- chest pain 8. HIGH RISK DISEASE: "Do you have any chronic medical problems?" (e.g., asthma, heart or lung disease, weak immune system, etc.)     obesity 9. PREGNANCY: "Is there any chance you are  pregnant?" "When was your last menstrual period?"     N/a- 10. OTHER SYMPTOMS: "Do you have any other symptoms?"  (e.g., chills, fatigue, headache, loss of smell or taste, muscle pain, sore throat)       Diarrhea, fatigue, nasal congestion, sore throat  Protocols used: CORONAVIRUS (COVID-19) DIAGNOSED OR SUSPECTED-A-AH

## 2018-09-30 NOTE — ED Provider Notes (Signed)
[facility] Emergency Department Provider Note  ____________________________________________   First MD Initiated Contact with Patient 09/30/18 1459     (approximate)  I have reviewed the triage vital signs and the nursing notes.   HISTORY  Chief Complaint Chest Pain, Diarrhea, and COVID Symptoms    HPI Patricia Patton is a 48 y.o. female with anxiety who presents with multiple symptoms.  Pt says she has had some substernal chest pain, that is moderate, constant, moves around her chest, nothing makes it better or worse.  Denies risk factors for ACS.  She then developed a few days ago multiple symptoms including diarrhea, headache, weakness, congestion, nausea, mild sob.  She is concerned she may have coronavirus.     Past Medical History:  Diagnosis Date  . Anxiety   . Bell's palsy     There are no active problems to display for this patient.   Past Surgical History:  Procedure Laterality Date  . CESAREAN SECTION    . FOOT SURGERY Bilateral     Prior to Admission medications   Medication Sig Start Date End Date Taking? Authorizing Provider  azithromycin (ZITHROMAX) 250 MG tablet Take 1 tablet (250 mg total) by mouth daily. Take first 2 tablets together, then 1 every day until finished. 07/02/17   Lutricia Feiloemer, William P, PA-C  cetirizine-pseudoephedrine (ZYRTEC-D) 5-120 MG tablet Take 1 tablet by mouth 2 (two) times daily. 04/15/16   Lutricia Feiloemer, William P, PA-C  ibuprofen (ADVIL,MOTRIN) 800 MG tablet Take 1 tablet (800 mg total) by mouth 3 (three) times daily. 03/01/16   Domenick GongMortenson, Ashley, MD  ipratropium (ATROVENT) 0.06 % nasal spray Place 2 sprays into both nostrils 4 (four) times daily. 3-4 times/ day 03/01/16   Domenick GongMortenson, Ashley, MD  naproxen (NAPROSYN) 500 MG tablet Take 1 tablet (500 mg total) by mouth 2 (two) times daily with a meal. 08/14/16   Candis SchatzHarris, Michael D, PA-C  ondansetron (ZOFRAN ODT) 8 MG disintegrating tablet Take 1 tablet (8 mg total)  by mouth 2 (two) times daily. 07/02/17   Lutricia Feiloemer, William P, PA-C  sertraline (ZOLOFT) 100 MG tablet Take 200 mg by mouth daily.    [provider]    Allergies Amoxicillin-pot clavulanate  Family History  Problem Relation Age of Onset  . Diabetes Mother   . Diabetes Father     Social History Social History   Tobacco Use  . Smoking status: Former Games developermoker  . Smokeless tobacco: Never Used  Substance Use Topics  . Alcohol use: Yes  . Drug use: No      Review of Systems Constitutional: No fever/chills Eyes: No visual changes. ENT: No sore throat. Cardiovascular: Positive chest pain Respiratory: Positive shortness of breath Gastrointestinal: No abdominal pain.  Positive nausea no vomiting.  Positive diarrhea.  No constipation. Genitourinary: Negative for dysuria.  Positive for increased urinary frequency Musculoskeletal: Negative for back pain. Skin: Negative for rash. Neurological: Positive for mild headache, no focal weakness or numbness. All other ROS negative ____________________________________________   PHYSICAL EXAM:  VITAL SIGNS: ED Triage Vitals  Enc Vitals Group     BP 09/30/18 1307 119/74     Pulse Rate 09/30/18 1307 72     Resp 09/30/18 1307 18     Temp 09/30/18 1307 98.5 F (36.9 C)     Temp Source 09/30/18 1307 Oral     SpO2 09/30/18 1307 98 %     Weight 09/30/18 1258 150 lb (68 kg)     Height 09/30/18 1258  5\' 2"  (1.575 m)     Head Circumference --      Peak Flow --      Pain Score 09/30/18 1257 8     Pain Loc --      Pain Edu? --      Excl. in GC? --     Constitutional: Alert and oriented. Well appearing and in no acute distress. Eyes: Conjunctivae are normal. EOMI. Head: Atraumatic. Nose: No congestion/rhinnorhea. Mouth/Throat: Mucous membranes are moist.   Neck: No stridor. Trachea Midline. FROM Cardiovascular: Normal rate, regular rhythm. Grossly normal heart sounds.  Good peripheral circulation. Respiratory: Normal respiratory  effort.  No retractions. Lungs CTAB. Gastrointestinal: Soft and nontender. No distention. No abdominal bruits.  Musculoskeletal: No lower extremity tenderness nor edema.  No joint effusions. Neurologic:  Normal speech and language. No gross focal neurologic deficits are appreciated.  Skin:  Skin is warm, dry and intact. No rash noted. Psychiatric: Mood and affect are normal. Speech and behavior are normal. GU: Deferred   ____________________________________________   LABS (all labs ordered are listed, but only abnormal results are displayed)  Labs Reviewed  BASIC METABOLIC PANEL - Abnormal; Notable for the following components:      Result Value   Glucose, Bld 113 (*)    All other components within normal limits  URINALYSIS, ROUTINE W REFLEX MICROSCOPIC - Abnormal; Notable for the following components:   Color, Urine YELLOW (*)    APPearance HAZY (*)    Hgb urine dipstick SMALL (*)    Ketones, ur 5 (*)    Bacteria, UA RARE (*)    All other components within normal limits  NOVEL CORONAVIRUS, NAA (HOSPITAL ORDER, SEND-OUT TO REF LAB)  CBC  PREGNANCY, URINE  TROPONIN I (HIGH SENSITIVITY)   ____________________________________________   ED ECG REPORT I, Concha SeMary E Teniqua Marron, the attending physician, personally viewed and interpreted this ECG.  EKG is normal sinus rate of 66, no ST elevation, nonspecific T wave inversion in lead III, normal intervals ____________________________________________  RADIOLOGY Vela ProseI, Carly Sabo E Tyniya Kuyper, personally viewed and evaluated these images (plain radiographs) as part of my medical decision making, as well as reviewing the written report by the radiologist.  ED MD interpretation: Chest x-ray reviewed without evidence of pneumonia.  Official radiology report(s): Dg Chest 1 View  Result Date: 09/30/2018 CLINICAL DATA:  Cough and intermittent chest pain for over a week. Nausea with some diarrhea. EXAM: CHEST  1 VIEW COMPARISON:  06/13/2011. FINDINGS: Normal  sized heart. Clear lungs. Cervical spine degenerative changes. IMPRESSION: No acute abnormality. Electronically Signed   By: Beckie SaltsSteven  Reid M.D.   On: 09/30/2018 14:50    ____________________________________________   PROCEDURES  Procedure(s) performed (including Critical Care):  Procedures   ____________________________________________   INITIAL IMPRESSION / ASSESSMENT AND PLAN / ED COURSE  Patricia Patton was evaluated in Emergency Department on 09/30/2018 for the symptoms described in the history of present illness. She was evaluated in the context of the global COVID-19 pandemic, which necessitated consideration that the patient might be at risk for infection with the SARS-CoV-2 virus that causes COVID-19. Institutional protocols and algorithms that pertain to the evaluation of patients at risk for COVID-19 are in a state of rapid change based on information released by regulatory bodies including the CDC and federal and state organizations. These policies and algorithms were followed during the patient's care in the ED.    Patient is a very well-appearing 48 year old with normal vital signs.  Patient chest pain is been  going on for multiple days.  Patient has no risk factors for ACS however given her age will get cardiac markers and EKG.  Patient endorses a multitude of other viral-like symptoms.  Patient does have some minimal shortness of breath however lung sounds clear bilaterally and she satting 100% on room air.  Patient is PERC negative therefore low suspicion for PE.  Patient has no neck stiffness therefore low suspicion for meningitis.  Again very well-appearing low suspicious for bacteremia.  Will send outpatient coronavirus testing.  Will get labs to evaluate for electrolyte abnormalities.  Will get urine to evaluate for UTI.    Patient's labs are reassuring except for mildly elevated glucose at 113.  Cardiac marker ruled her out and given her chest pain is been going on  for so long she will needs one set.  Chest x-ray without any evidence of pneumonia.  Urine without any evidence of UTI.  Covid test pending.   ____________________________________________   FINAL CLINICAL IMPRESSION(S) / ED DIAGNOSES   Final diagnoses:  Cough      MEDICATIONS GIVEN DURING THIS VISIT:  Medications - No data to display   ED Discharge Orders    None       Note:  This document was prepared using Dragon voice recognition software and may include unintentional dictation errors.   Vanessa [place], MD 09/30/18 1640

## 2018-09-30 NOTE — Discharge Instructions (Signed)
You should follow-up with your primary care doctor.  Coronavirus testing is pending they will call you with results.  You can take ibuprofen and Tylenol to help with some of the discomfort.     Person Under Monitoring Name: [facility]  Location: [address] [place] [id number]   Infection Prevention Recommendations for Individuals Confirmed to have, or Being Evaluated for, 2019 Novel Coronavirus (COVID-19) Infection Who Receive Care at Home  Individuals who are confirmed to have, or are being evaluated for, COVID-19 should follow the prevention steps below until a healthcare provider or local or state health department says they can return to normal activities.  Stay home except to get medical care You should restrict activities outside your home, except for getting medical care. Do not go to work, school, or public areas, and do not use public transportation or taxis.  Call ahead before visiting your doctor Before your medical appointment, call the healthcare provider and tell them that you have, or are being evaluated for, COVID-19 infection. This will help the healthcare providers office take steps to keep other people from getting infected. Ask your healthcare provider to call the local or state health department.  Monitor your symptoms Seek prompt medical attention if your illness is worsening (e.g., difficulty breathing). Before going to your medical appointment, call the healthcare provider and tell them that you have, or are being evaluated for, COVID-19 infection. Ask your healthcare provider to call the local or state health department.  Wear a facemask You should wear a facemask that covers your nose and mouth when you are in the same room with other people and when you visit a healthcare provider. People who live with or visit you should also wear a facemask while they are in the same room with you.  Separate yourself from other people in your home As  much as possible, you should stay in a different room from other people in your home. Also, you should use a separate bathroom, if available.  Avoid sharing household items You should not share dishes, drinking glasses, cups, eating utensils, towels, bedding, or other items with other people in your home. After using these items, you should wash them thoroughly with soap and water.  Cover your coughs and sneezes Cover your mouth and nose with a tissue when you cough or sneeze, or you can cough or sneeze into your sleeve. Throw used tissues in a lined trash can, and immediately wash your hands with soap and water for at least 20 seconds or use an alcohol-based hand rub.  Wash your Union Pacific Corporationhands Wash your hands often and thoroughly with soap and water for at least 20 seconds. You can use an alcohol-based hand sanitizer if soap and water are not available and if your hands are not visibly dirty. Avoid touching your eyes, nose, and mouth with unwashed hands.   Prevention Steps for Caregivers and Household Members of Individuals Confirmed to have, or Being Evaluated for, COVID-19 Infection Being Cared for in the Home  If you live with, or provide care at home for, a person confirmed to have, or being evaluated for, COVID-19 infection please follow these guidelines to prevent infection:  Follow healthcare providers instructions Make sure that you understand and can help the patient follow any healthcare provider instructions for all care.  Provide for the patients basic needs You should help the patient with basic needs in the home and provide support for getting groceries, prescriptions, and other personal needs.  Monitor  the patients symptoms If they are getting sicker, call his or her medical provider and tell them that the patient has, or is being evaluated for, COVID-19 infection. This will help the healthcare providers office take steps to keep other people from getting infected. Ask  the healthcare provider to call the local or state health department.  Limit the number of people who have contact with the patient If possible, have only one caregiver for the patient. Other household members should stay in another home or place of residence. If this is not possible, they should stay in another room, or be separated from the patient as much as possible. Use a separate bathroom, if available. Restrict visitors who do not have an essential need to be in the home.  Keep older adults, very young children, and other sick people away from the patient Keep older adults, very young children, and those who have compromised immune systems or chronic health conditions away from the patient. This includes people with chronic heart, lung, or kidney conditions, diabetes, and cancer.  Ensure good ventilation Make sure that shared spaces in the home have good air flow, such as from an air conditioner or an opened window, weather permitting.  Wash your hands often Wash your hands often and thoroughly with soap and water for at least 20 seconds. You can use an alcohol based hand sanitizer if soap and water are not available and if your hands are not visibly dirty. Avoid touching your eyes, nose, and mouth with unwashed hands. Use disposable paper towels to dry your hands. If not available, use dedicated cloth towels and replace them when they become wet.  Wear a facemask and gloves Wear a disposable facemask at all times in the room and gloves when you touch or have contact with the patients blood, body fluids, and/or secretions or excretions, such as sweat, saliva, sputum, nasal mucus, vomit, urine, or feces.  Ensure the mask fits over your nose and mouth tightly, and do not touch it during use. Throw out disposable facemasks and gloves after using them. Do not reuse. Wash your hands immediately after removing your facemask and gloves. If your personal clothing becomes contaminated,  carefully remove clothing and launder. Wash your hands after handling contaminated clothing. Place all used disposable facemasks, gloves, and other waste in a lined container before disposing them with other household waste. Remove gloves and wash your hands immediately after handling these items.  Do not share dishes, glasses, or other household items with the patient Avoid sharing household items. You should not share dishes, drinking glasses, cups, eating utensils, towels, bedding, or other items with a patient who is confirmed to have, or being evaluated for, COVID-19 infection. After the person uses these items, you should wash them thoroughly with soap and water.  Wash laundry thoroughly Immediately remove and wash clothes or bedding that have blood, body fluids, and/or secretions or excretions, such as sweat, saliva, sputum, nasal mucus, vomit, urine, or feces, on them. Wear gloves when handling laundry from the patient. Read and follow directions on labels of laundry or clothing items and detergent. In general, wash and dry with the warmest temperatures recommended on the label.  Clean all areas the individual has used often Clean all touchable surfaces, such as counters, tabletops, doorknobs, bathroom fixtures, toilets, phones, keyboards, tablets, and bedside tables, every day. Also, clean any surfaces that may have blood, body fluids, and/or secretions or excretions on them. Wear gloves when cleaning surfaces the patient has come  in contact with. Use a diluted bleach solution (e.g., dilute bleach with 1 part bleach and 10 parts water) or a household disinfectant with a label that says EPA-registered for coronaviruses. To make a bleach solution at home, add 1 tablespoon of bleach to 1 quart (4 cups) of water. For a larger supply, add  cup of bleach to 1 gallon (16 cups) of water. Read labels of cleaning products and follow recommendations provided on product labels. Labels contain  instructions for safe and effective use of the cleaning product including precautions you should take when applying the product, such as wearing gloves or eye protection and making sure you have good ventilation during use of the product. Remove gloves and wash hands immediately after cleaning.  Monitor yourself for signs and symptoms of illness Caregivers and household members are considered close contacts, should monitor their health, and will be asked to limit movement outside of the home to the extent possible. Follow the monitoring steps for close contacts listed on the symptom monitoring form.   ? If you have additional questions, contact your local health department or call the epidemiologist on call at [phone] (available 24/7). ? This guidance is subject to change. For the most up-to-date guidance from [facility], please refer to their website: TripMetro.huhttps://www.cdc.gov/coronavirus/2019-ncov/hcp/guidance-prevent-spread.html

## 2018-10-02 LAB — NOVEL CORONAVIRUS, NAA (HOSP ORDER, SEND-OUT TO REF LAB; TAT 18-24 HRS): SARS-CoV-2, NAA: NOT DETECTED

## 2019-06-03 ENCOUNTER — Other Ambulatory Visit: Payer: Self-pay

## 2019-06-03 ENCOUNTER — Ambulatory Visit
Admission: EM | Admit: 2019-06-03 | Discharge: 2019-06-03 | Disposition: A | Discharge status: Home / Self Care | Payer: BC Managed Care – PPO | Attending: Family Medicine | Admitting: Family Medicine

## 2019-06-03 ENCOUNTER — Telehealth: Payer: Self-pay

## 2019-06-03 DIAGNOSIS — R0981 Nasal congestion: Secondary | ICD-10-CM | POA: Insufficient documentation

## 2019-06-03 DIAGNOSIS — U071 COVID-19: Secondary | ICD-10-CM | POA: Insufficient documentation

## 2019-06-03 DIAGNOSIS — Z87891 Personal history of nicotine dependence: Secondary | ICD-10-CM | POA: Insufficient documentation

## 2019-06-03 DIAGNOSIS — R5383 Other fatigue: Secondary | ICD-10-CM | POA: Diagnosis not present

## 2019-06-03 DIAGNOSIS — M791 Myalgia, unspecified site: Secondary | ICD-10-CM | POA: Diagnosis not present

## 2019-06-03 DIAGNOSIS — Z79899 Other long term (current) drug therapy: Secondary | ICD-10-CM | POA: Diagnosis not present

## 2019-06-03 DIAGNOSIS — J029 Acute pharyngitis, unspecified: Secondary | ICD-10-CM

## 2019-06-03 DIAGNOSIS — F419 Anxiety disorder, unspecified: Secondary | ICD-10-CM | POA: Diagnosis not present

## 2019-06-03 DIAGNOSIS — B349 Viral infection, unspecified: Secondary | ICD-10-CM | POA: Diagnosis not present

## 2019-06-03 LAB — GROUP A STREP BY PCR: Group A Strep by PCR: NOT DETECTED

## 2019-06-03 LAB — RAPID INFLUENZA A&B ANTIGENS
Influenza A (ARMC): NEGATIVE
Influenza B (ARMC): NEGATIVE

## 2019-06-03 LAB — INFLUENZA PANEL BY PCR (TYPE A & B)
Influenza A By PCR: NEGATIVE
Influenza B By PCR: NEGATIVE

## 2019-06-03 LAB — RAPID STREP SCREEN (MED CTR MEBANE ONLY): Streptococcus, Group A Screen (Direct): NOT DETECTED — AB

## 2019-06-03 NOTE — Discharge Instructions (Signed)
Rest, fluids, over the counter cold/cough/flu medications

## 2019-06-03 NOTE — ED Provider Notes (Signed)
[facility]    CSN: [id number] Arrival date & time: 06/03/19  0827      History   Chief Complaint Chief Complaint  Patient presents with  . Nasal Congestion    HPI Patricia Patton is a 49 y.o. female.   49 yo female with a c/o sore throat, body aches, chills, nasal congestion, and fatigue since yesterday. States took allergy medication and inhaler yesterday without relief. Denies any chest pains, shortness of breath, vomiting, diarrhea.      Past Medical History:  Diagnosis Date  . Anxiety   . Bell's palsy   . Strep throat 2018    There are no problems to display for this patient.   Past Surgical History:  Procedure Laterality Date  . CESAREAN SECTION    . FOOT SURGERY Bilateral   . TONSILLECTOMY      OB History   No obstetric history on file.      Home Medications    Prior to Admission medications   Medication Sig Start Date End Date Taking? Authorizing Provider  albuterol (VENTOLIN HFA) 108 (90 Base) MCG/ACT inhaler Inhale into the lungs every 6 (six) hours as needed for wheezing or shortness of breath.   Yes [provider]  FLUoxetine (PROZAC) 20 MG capsule Take 20 mg by mouth daily.   Yes [provider]  ipratropium-albuterol (DUONEB) 0.5-2.5 (3) MG/3ML SOLN Take 3 mLs by nebulization.   Yes [provider]  lamoTRIgine (LAMICTAL) 100 MG tablet Take 100 mg by mouth 2 (two) times daily. 09/26/18  Yes [provider]  traZODone (DESYREL) 50 MG tablet Take 50-100 mg by mouth at bedtime as needed for sleep. 08/13/18  Yes [provider]  clonazePAM (KLONOPIN) 0.5 MG tablet Take 0.25-5 mg by mouth daily as needed (severe anxiety).  08/13/18   [provider]  propranolol (INDERAL) 20 MG tablet Take 20 mg by mouth 3 (three) times daily. 06/27/18   [provider]  risperiDONE (RISPERDAL) 0.5 MG tablet Take 0.5 mg by mouth at bedtime. 09/26/18   [provider]    Family  History Family History  Problem Relation Age of Onset  . Diabetes Mother   . Diabetes Father     Social History Social History   Tobacco Use  . Smoking status: Former Research scientist (life sciences)  . Smokeless tobacco: Never Used  Substance Use Topics  . Alcohol use: Yes    Comment: occasional   . Drug use: No     Allergies   Amoxicillin-pot clavulanate   Review of Systems Review of Systems   Physical Exam Triage Vital Signs ED Triage Vitals  Enc Vitals Group     BP 06/03/19 0859 118/83     Pulse Rate 06/03/19 0859 91     Resp 06/03/19 0859 18     Temp 06/03/19 0859 (!) 101.4 F (38.6 C)     Temp Source 06/03/19 0859 Oral     SpO2 06/03/19 0859 97 %     Weight --      Height --      Head Circumference --      Peak Flow --      Pain Score 06/03/19 0853 6     Pain Loc --      Pain Edu? --      Excl. in [place]? --    No data found.  Updated Vital Signs BP 118/83 (BP Location: Left Arm)   Pulse 91   Temp (!) 101.4 F (  38.6 C) (Oral)   Resp 18   SpO2 97%   Visual Acuity Right Eye Distance:   Left Eye Distance:   Bilateral Distance:    Right Eye Near:   Left Eye Near:    Bilateral Near:     Physical Exam Vitals and nursing note reviewed.  Constitutional:      General: She is not in acute distress.    Appearance: She is not toxic-appearing or diaphoretic.  Cardiovascular:     Rate and Rhythm: Normal rate.     Heart sounds: Normal heart sounds.  Pulmonary:     Effort: Pulmonary effort is normal. No respiratory distress.     Breath sounds: Normal breath sounds. No stridor. No wheezing, rhonchi or rales.  Skin:    Findings: No rash.  Neurological:     Mental Status: She is alert.      UC Treatments / Results  Labs (all labs ordered are listed, but only abnormal results are displayed) Labs Reviewed  RAPID STREP SCREEN ([facility] ONLY) - Abnormal; Notable for the following components:      Result Value   Streptococcus, Group A Screen (Direct) NOT DETECTED (*)     All other components within normal limits  RAPID INFLUENZA A&B ANTIGENS  GROUP A STREP BY PCR  SARS CORONAVIRUS 2 (TAT 6-24 HRS)  INFLUENZA PANEL BY PCR (TYPE A & B)    EKG   Radiology No results found.  Procedures Procedures (including critical care time)  Medications Ordered in UC Medications - No data to display  Initial Impression / Assessment and Plan / UC Course  I have reviewed the triage vital signs and the nursing notes.  Pertinent labs & imaging results that were available during my care of the patient were reviewed by me and considered in my medical decision making (see chart for details).      Final Clinical Impressions(s) / UC Diagnoses   Final diagnoses:  Viral syndrome     Discharge Instructions     Rest, fluids, over the counter cold/cough/flu medications    ED Prescriptions    None      1. Lab results and diagnosis reviewed with patient 2. Await covid test 3. Recommend supportive treatment with rest, fluids and over the counter medications as needed 4. Follow-up prn if symptoms worsen or don't improve   PDMP not reviewed this encounter.   Payton Mccallum, MD 06/03/19 1945

## 2019-06-03 NOTE — ED Triage Notes (Signed)
Pt presents with sore throat, body aches, chills, nasal congestion, feeling of "pushing" in chest that causes pain with inspiration, fatigue, low fever x 1 day. Has significant hx of strep prior tonsillectomy 2 years ago but has not had it since then.  Used PRN inhaler yesterday with no relief and allergy pill little relief.

## 2019-06-03 NOTE — Telephone Encounter (Signed)
TC to pt.  Advised of negative flu and strep tests.  COVID still pending.  Pt asked how to tx fever - can take Tylenol/Ibuprofen as directed on bottle, take cool shower.  If fever still high with medication, can go to ED.

## 2019-06-04 LAB — SARS CORONAVIRUS 2 (TAT 6-24 HRS): SARS Coronavirus 2: POSITIVE — AB

## 2019-12-05 IMAGING — DX CHEST  1 VIEW
1 series · 1 of 1 positions shown · non-contrast
Comparison: 06/13/2011.

CLINICAL DATA: Cough and intermittent chest pain for over a week.
Nausea with some diarrhea.

EXAM:
CHEST  1 VIEW

[chest ap]
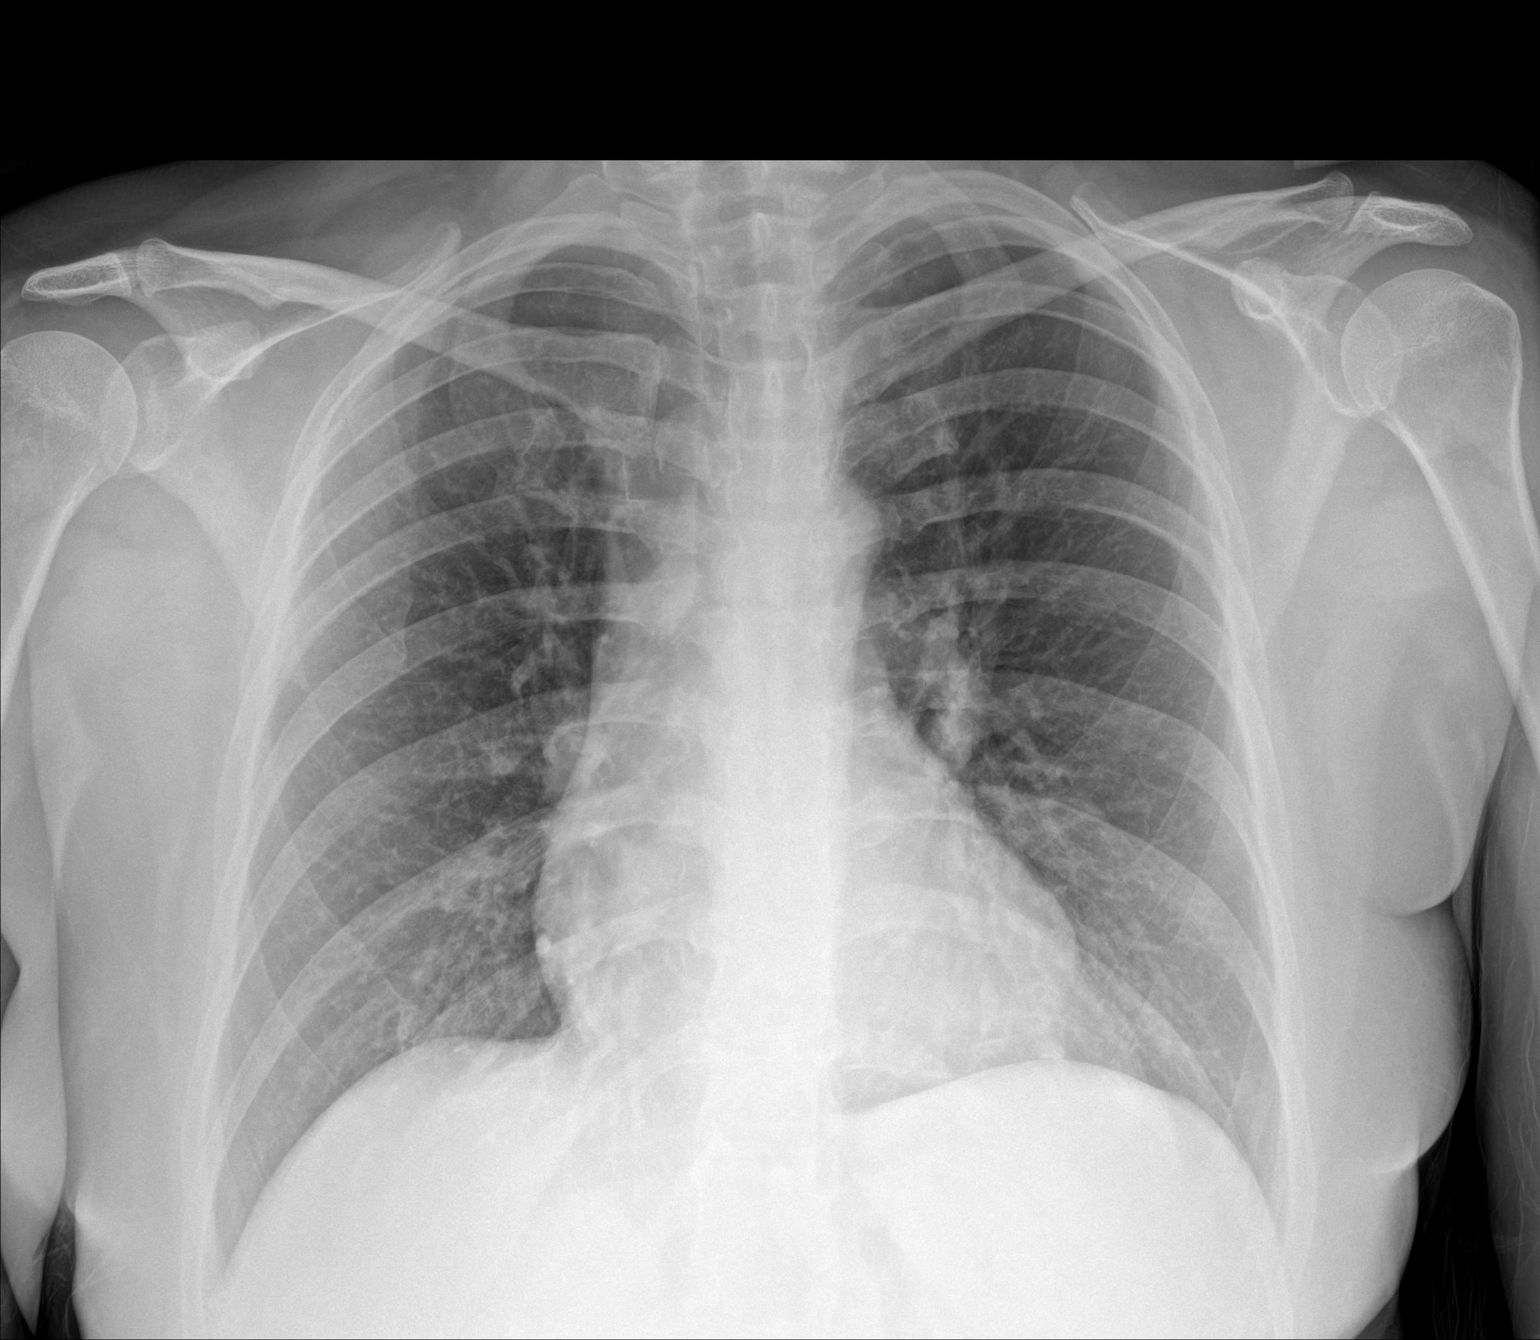

[1 of 1 positions shown; findings below may reference images not displayed]

FINDINGS: Normal sized heart. Clear lungs. Cervical spine degenerative
changes.
IMPRESSION: No acute abnormality.

## 2020-02-10 DIAGNOSIS — F0630 Mood disorder due to known physiological condition, unspecified: Secondary | ICD-10-CM | POA: Insufficient documentation

## 2021-11-19 LAB — COLOGUARD: COLOGUARD: NEGATIVE

## 2022-12-30 DIAGNOSIS — G629 Polyneuropathy, unspecified: Secondary | ICD-10-CM | POA: Insufficient documentation

## 2022-12-30 DIAGNOSIS — E7800 Pure hypercholesterolemia, unspecified: Secondary | ICD-10-CM | POA: Insufficient documentation

## 2023-07-31 DIAGNOSIS — M654 Radial styloid tenosynovitis [de Quervain]: Secondary | ICD-10-CM | POA: Insufficient documentation

## 2023-10-13 NOTE — ED Provider Notes (Signed)
 [facility] Emergency Department Provider Note    ED Clinical Impression    Final diagnoses:  Pain of scalp (Primary)  Electric shock-type pain        Impression, Medical Decision Making, Progress Notes and Critical Care    Impression, Differential Diagnosis and Plan of Care  This is an afebrile, nontoxic-appearing 53 year old female presenting from urgent care due to shocklike pain to her right parietal scalp region as well as around her right ear with shocklike presentations that come and go and seen at urgent care and sent to ED to rule out intracranial abnormality.  Exam patient has no notable lesions.  There is some mild erythema to her scalp however undetermined whether this is new or chronic in nature.  She does have significant tenderness to very light palpation over her parietal scalp.  She has no notable neurological deficits.  She has no abnormalities of her cranial nerves.  EOM intact, PERRLA.  Low suspicion for any intracranial abnormality with pain elicited with light touch to scalp.  Concerns for possibly developing shingles.  Will increase her gabapentin  prescription that she is currently on, prescribed Toradol , and valacyclovir 1000 mg 3 times daily for the next 7 days.  Advised on close return precautions and discharged the patient when she received her medications.  ED Course as of 10/13/23 1304  Fri Oct 13, 2023  1304 Patient received her medications and was discharged.      Portions of this record have been created using Scientist, clinical (histocompatibility and immunogenetics). Dictation errors have been sought, but may not have been identified and corrected.  See chart and resident provider documentation for details.  ____________________________________________      History     Reason for Visit Head Pain    HPI  Patricia Patton is a 53 y.o. female presenting from urgent care due to shocklike pain to right parietal region that shoots down her neck and around  her right ear with significant tenderness to very light touch of her head.  She has no concerning neurological deficits to report.   External Records Reviewed (Inpatient/Outpatient notes, Prior labs/imaging studies, Care Everywhere, PDMP, External ED notes, etc)  Reviewed previous notes, imaging, care everywhere, PDMP for pertinent information related to visit.  Past Medical History[1]  Problem List[2]  Past Surgical History[3]   Current Facility-Administered Medications:  .  gabapentin  (NEURONTIN ) capsule 300 mg, 300 mg, Oral, Once, Baker, Donnice [place], PA .  ketorolac  (TORADOL ) injection 30 mg, 30 mg, Intramuscular, Once, Baker, Donnice [place], PA .  valACYclovir (VALTREX) tablet 1,000 mg, 1,000 mg, Oral, Once, Lennie Donnice [place], PA  Current Outpatient Medications:  .  ZEPBOUND 2.5 mg/0.5 mL injection pen, inject 0.5 mls (2.5 mg total) subcutaneously every 7 (seven) days, Disp: , Rfl:  .  ALPRAZolam (XANAX) 0.5 MG tablet, Take 1 tablet (0.5 mg total) by mouth Three (3) times a day as needed for anxiety. As needed, Disp: 90 tablet, Rfl: 0 .  DULoxetine  (CYMBALTA ) 60 MG capsule, Take 1 capsule (60 mg total) by mouth daily., Disp: 90 capsule, Rfl: 3 .  gabapentin  (NEURONTIN ) 300 MG capsule, Take 1 capsule (300 mg total) by mouth in the morning., Disp: 90 capsule, Rfl: 0 .  gabapentin  (NEURONTIN ) 300 MG capsule, Take 1 capsule (300 mg total) by mouth Three (3) times a day for 7 days., Disp: 21 capsule, Rfl: 0 .  ketorolac  (TORADOL ) 10 mg tablet, Take 1 tablet (10 mg total) by mouth every six (6) hours as  needed for up to 5 days., Disp: 20 tablet, Rfl: 0 .  traZODone  (DESYREL ) 150 MG tablet, Take 1 tablet (150 mg total) by mouth nightly., Disp: 90 tablet, Rfl: 3 .  valACYclovir (VALTREX) 1000 MG tablet, Take 1 tablet (1,000 mg total) by mouth Three (3) times a day for 7 days., Disp: 21 tablet, Rfl: 0  Allergies Amoxicillin-pot clavulanate  Family History[4]  Social  History Short Social History[5]    Physical Exam   ED Triage Vitals [10/13/23 1115]  Enc Vitals Group     BP 108/75     Pulse 93     SpO2 Pulse      Resp 18     Temp 36.7 C (98.1 F)     Temp Source Oral     SpO2 98 %     Weight 59.2 kg (130 lb 10 oz)     Height      Head Circumference      Peak Flow      Pain Score      Pain Loc      Pain Education      Exclude from Growth Chart     Constitutional: Alert and oriented. Well appearing and in no distress. Eyes: Conjunctivae are normal. ENT      Head: Normocephalic and atraumatic.  Tenderness palpation of right parietal occipital region with no notable lesions and mild erythema noted within the hair however undetermined if this is new or baseline.      Nose: No congestion.      Mouth/Throat: Mucous membranes are moist.      Neck: No stridor. Hematological/Lymphatic/Immunilogical: No cervical lymphadenopathy. Cardiovascular: Normal rate, regular rhythm.  Respiratory: Normal respiratory effort.  Musculoskeletal: Normal range of motion in all extremities. Neurologic: Normal speech and language. No gross focal neurologic deficits are appreciated. Skin: Skin is warm, dry and intact. No rash noted. Psychiatric: Mood and affect are normal. Speech and behavior are normal.          [1] Past Medical History: Diagnosis Date  . ADD (attention deficit disorder)    adderall  . Anxiety   . Depression    sertraline  . Depressive disorder   . Gestational diabetes (HHS-HCC)   . IBS (irritable bowel syndrome)   [2] Patient Active Problem List Diagnosis  . ADD (attention deficit disorder)  . Cyclothymic disorder  . Chronic fatigue and malaise  . Irritability and anger  . 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 De Quervain's tenosynovitis  . Chronic pain of left wrist  . Fibromyalgia  . Hypercholesterolemia  . Small fiber neuropathy  [3] Past Surgical History: Procedure Laterality Date  .  birth mark removal    . CESAREAN SECTION  2002  . FOOT SURGERY Left   . PR REMOVAL OF TONSILS,12+ Y/O Bilateral 02/23/2018   Procedure: TONSILLECTOMY, PRIMARY OR SECONDARY; AGE 40 OR OVER;  Surgeon: Wanda Almarie Altes, MD;  [facility];  Service: ENT  . REPEAT CESAREAN SECTION  2010   with BTL  [4] Family History Problem Relation Age of Onset  . Diabetes Mother   . Diabetes Father   . Alcohol abuse Other        FAMILY H/O  . Cancer Other        FAMILY H/O   . Stroke Other        FAMILY H/O  . Diabetes Other        FAMILY H/O   . Ovarian  cancer Paternal Grandfather   [5] Social History Tobacco Use  . Smoking status: Former    Current packs/day: 0.00    Average packs/day: 0.3 packs/day for 1 year (0.3 ttl pk-yrs)    Types: Cigarettes    Start date: 11/14/1992    Quit date: 11/14/1993    Years since quitting: 29.9    Passive exposure: Past  . Smokeless tobacco: Never  Vaping Use  . Vaping status: Never Used  Substance Use Topics  . Alcohol use: Yes    Alcohol/week: 3.0 standard drinks of alcohol    Types: 3 Standard drinks or equivalent per week    Comment: occasionally  . Drug use: No   Lennie Donnice [place], [place] 10/13/23 1304

## 2023-11-27 NOTE — ED Provider Notes (Signed)
 [facility] Emergency Department Provider Note    ED Clinical Impression    Final diagnoses:  Neuropathic pain (Primary)  Suspect trigeminal neuralgia      Impression, Medical Decision Making, Progress Notes and Critical Care    Impression, Differential Diagnosis and Plan of Care  Patient is a 53 y.o. female with PMH of IBS, Bell's palsy, and neuropathic pain presenting with 1.5 months of progressively worsening scalp pain uncontrolled by home oxycodone  and gabapentin . Upon presentation, she endorses 9/10 pain in her bilateral forehead / temples (R > L).  Patient is well-appearing and in no acute distress. Vital signs are hemodynamically stable and within normal limits. On exam, CN 2-12 intact. Exquisite tenderness to even light palpation of the right temple and right cheek. Unable to determine if more tender over temporal artery.  Medical Decision Making A 53 year old woman with a history of childhood varicella presented with severe, diffuse head pain since early August, initially localized to the right parietal scalp and now involving the entire head, with marked tenderness over the frontal and cheek areas. She reported associated sore throat, difficulty opening her mouth, and burning pain, but denied fever, recent head injury, or rash. Examination revealed significant tenderness in the trigeminal nerve distribution, with no focal neurological deficits. She has been on high-dose gabapentin  with minimal relief and has also used oxycodone  and Tylenol  for pain control. Per patient, she was told that her headache was due to herpes zoster without rash.  Differential diagnosis includes, but is not limited to: - Trigeminal Neuralgia: The clinical picture is most consistent with trigeminal neuralgia given the distribution of sharp facial pain being most notable at nerve distribution V1 and V2, with exquisite tenderness to even light palpation. - Vascular Etiology (e.g.,  Temporal Arteritis, Aneurysm): A vascular cause such as temporal arteritis or aneurysm is considered due to diffuse, severe head pain and temple tenderness, prompting evaluation with imaging and inflammatory markers. - Superficial Skin or Bacterial Infection: A superficial skin or bacterial infection was considered but is less likely given the absence of rash or signs of infection on exam.  -Considered space-occupying mass lesion.  Patient reports new onset headache after age 46, CT imaging also performed to assess for space-occupying mass lesion. - At this time I have a low suspicion of acute meningitis or encephalitis in this patient, and as such no lumbar puncture was performed.  Plan: - Order CT scan of the head and inflammatory markers to evaluate for vascular involvement or aneurysm. - Administer IV pain medication for pain relief. -will initiate treatment with carbamazepine  given symptoms are consistent with trigeminal neuralgia   Labs and imaging reviewed: No evidence of significant leukocytosis, anemia, electrolyte derangement.  ESR and CRP results are within normal limits.  CTA head and neck are negative for space-occupying mass lesion or aneurysm.  Given lack of elevation of ESR and CRP, low suspicion of acute temporal arteritis in this patient.  On reexamination, the patient felt significantly improved, of headache, noted that the headache that she primarily felt on the right side of her face had improved but now has had generalized headache.  Discussed results of the lab and imaging test with the patient as well as her spouse.  Discussed high clinical suspicion that she is experiencing pain of trigeminal neuralgia.  Advised that herpes zoster without rash may still be contributing to symptoms but treatment overall is similar.  Discussed the importance of close outpatient follow-up with neurology.  Recommended continuation of previously  prescribed antiviral medication as this would not be  significantly detrimental to the patient.    I have discussed the case with my attending physician.  See below.  At this time, I did recommend continuation of carbamazepine  which is drug of choice for trigeminal neuralgia.  Discussed potential for sedation with this medication and discussed method of tapering gabapentin  given that the patient is on quite a high dose, recommended decreasing gabapentin  by 300 mg daily, every 3 days if she finds that the carbamazepine  is helping alleviate her pain.  Discussed other methods of analgesia such as ibuprofen  and/or Tylenol .  Discussed return precautions for any development of focal weakness, vision loss, intractable vomiting or any other concerning symptoms.  I had an at length discussion with the patient and her husband with regard to results, treatment plan, return precautions, presumptive diagnosis, and importance of close outpatient follow-up with neurology.  The patient and her husband verbalized understanding and agreed.  They were comfortable with discharge.  The patient was discharged in stable condition.      Independent Interpretation of Studies  I have independently interpreted the following studies: + CT/MRI(s): Negative CTA head and neck  Discussion of Management with other Providers or Support Staff  I discussed the management of this patient with the: Attending physician: I have discussed the case with attending physician Dr. Lonni, regards to lab and imaging test my high clinical suspicion of trigeminal neuralgia.  Discussed initiation of outpatient prescription for carbamazepine .  He states that this would be reasonable but did recommend slowly tapering gabapentin  if carbamazepine  begins to help patient's headache, by decreasing dose by 300 mg/day.  Portions of this record have been created using Scientist, clinical (histocompatibility and immunogenetics). Dictation errors have been sought, but may not have been identified and corrected.  See chart and resident  provider documentation for details.  ____________________________________________     HISTORY     Reason for Visit Medical Problem   HPI  Patricia Patton is a 53 y.o. female with a past medical history of IBS, Bell's palsy, and neuropathic pain who presents with scalp pain. Patient reports worsening scalp pain starting 1.5 months ago with associated sore throat. She states her pain started in her right parietal scalp, but has progressively spread across her right forehead and cheek.  after 2 weeks her pain spread to the entire right side of her head, and has been diffuse across her entire head for the past few days. Upon presentation, she endorses 9/10 pain in her bilateral forehead / temples (R > L). She further endorses difficulty eating and drinking 2/2 sore throat. She has been taking oxycodone , gabapentin , and Tylenol  at home with mild relief of her pain. No recent traumas or injuries. Denies fevers.  Per chart review, patient was seen by her PCP 1 month ago (10/31/2023) for right scalp pain, at which time they were concerned for shingles. She is currently prescribed oxycodone , gabapentin  900 mg TID, and trazodone  for home pain relief.  Outside Historian(s) (EMS, Significant Other, Family, Parent, Caregiver, Friend, Law Enforcement, etc.)  None  External Records Reviewed (Inpatient/Outpatient notes, Prior labs/imaging studies, Care Everywhere, PDMP, External ED notes, etc)  10/31/2023 Fam Med Note.  Past Medical History[1]  Problem List[2]  Past Surgical History[3]  No current facility-administered medications for this encounter.  Current Outpatient Medications:  .  ALPRAZolam (XANAX) 0.5 MG tablet, Take 1 tablet (0.5 mg total) by mouth Three (3) times a day as needed for anxiety. As needed, Disp: 90 tablet,  Rfl: 0 .  carBAMazepine  (TEGRETOL ) 100 mg chewable tablet, Chew 1 tablet (100 mg total) Three (3) times a day., Disp: 90 tablet, Rfl: 0 .  DULoxetine  (CYMBALTA ) 60 MG  capsule, Take 1 capsule (60 mg total) by mouth daily., Disp: 90 capsule, Rfl: 3 .  gabapentin  (NEURONTIN ) 300 MG capsule, Take 1 capsule (300 mg total) by mouth in the morning., Disp: 90 capsule, Rfl: 0 .  gabapentin  (NEURONTIN ) 300 MG capsule, Take 1 capsule (300 mg total) by mouth Three (3) times a day for 7 days., Disp: 21 capsule, Rfl: 0 .  traZODone  (DESYREL ) 150 MG tablet, Take 1 tablet (150 mg total) by mouth nightly., Disp: 90 tablet, Rfl: 3 .  ZEPBOUND 2.5 mg/0.5 mL injection pen, inject 0.5 mls (2.5 mg total) subcutaneously every 7 (seven) days, Disp: , Rfl:   Allergies Amoxicillin-pot clavulanate  Family History[4]  Social History Short Social History[5]   PHYSICAL EXAM    ED Triage Vitals [11/27/23 1845]  Enc Vitals Group     BP 114/75     Pulse 110     SpO2 Pulse      Resp 20     Temp 36.3 C (97.4 F)     Temp Source Temporal     SpO2 100 %     Weight 60.3 kg (133 lb)   General: Alert, no acute distress. Skin: Warm, dry. Head: Normocephalic, atraumatic. Exquisite tenderness to even light palpation of the right temple and right cheek, most notable at distributions of V1 and V2. Unable to determine if more tender over temporal artery. Neck: Trachea midline. No nuchal rigidity or meningismus. Eye: normal conjunctiva. Ears, nose, mouth and throat: Oral mucosa moist, no pharyngeal erythema or exudate. Cardiovascular: Normal peripheral perfusion, No edema. Respiratory: respirations are non-labored, breath sounds are equal. Chest wall: No deformity. Back: Normal range of motion. Musculoskeletal: Normal ROM, normal strength. Gastrointestinal: Soft, Non distended. Neurological: Alert and oriented to person, place, time, and situation, No focal neurological deficit observed.  Cranial nerve II through XII are intact negative Romberg negative pronator drift Psychiatric: Cooperative, appropriate mood & affect.     RESULTS    Labs   Results for orders placed or  performed during the hospital encounter of 11/27/23  Comprehensive Metabolic Panel  Result Value Ref Range   Sodium 147 (H) 135 - 145 mmol/L   Potassium 3.3 (L) 3.4 - 4.8 mmol/L   Chloride 105 98 - 107 mmol/L   CO2 30.1 20.0 - 31.0 mmol/L   Anion Gap 12 5 - 14 mmol/L   BUN 12 9 - 23 mg/dL   Creatinine 9.24 9.44 - 1.02 mg/dL   BUN/Creatinine Ratio 16    eGFR CKD-EPI (2021) Female >90 >=60 mL/min/1.19m2   Glucose 126 70 - 179 mg/dL   Calcium  9.1 8.7 - 10.4 mg/dL   Albumin  3.5 3.4 - 5.0 g/dL   Total Protein 6.3 5.7 - 8.2 g/dL   Total Bilirubin 0.2 (L) 0.3 - 1.2 mg/dL   AST 11 <=65 U/L   ALT 9 (L) 10 - 49 U/L   Alkaline Phosphatase 69 46 - 116 U/L  Sedimentation rate, manual  Result Value Ref Range   Sed Rate 4 0 - 30 mm/h  C-reactive protein  Result Value Ref Range   CRP <5.0 <=10.0 mg/L  CBC w/ Differential  Result Value Ref Range   WBC 6.6 3.6 - 11.2 10*9/L   RBC 4.37 3.95 - 5.13 10*12/L   HGB 13.4 11.3 - 14.9  g/dL   HCT 61.7 65.9 - 55.9 %   MCV 87.4 77.6 - 95.7 fL   MCH 30.6 25.9 - 32.4 pg   MCHC 35.0 32.0 - 36.0 g/dL   RDW 87.1 87.7 - 84.7 %   MPV 7.6 6.8 - 10.7 fL   Platelet 237 150 - 450 10*9/L   nRBC 0 <=4 /100 WBCs   Neutrophils % 49.1 %   Lymphocytes % 38.6 %   Monocytes % 9.0 %   Eosinophils % 2.6 %   Basophils % 0.7 %   Absolute Neutrophils 3.3 1.8 - 7.8 10*9/L   Absolute Lymphocytes 2.6 1.1 - 3.6 10*9/L   Absolute Monocytes 0.6 0.3 - 0.8 10*9/L   Absolute Eosinophils 0.2 0.0 - 0.5 10*9/L   Absolute Basophils 0.0 0.0 - 0.1 10*9/L     Radiology   CTA head with contrast Impression  No acute intracranial abnormality. The major arterial structures are normal in appearance.   Narrative  EXAM: Computed tomographic angiography, head, with contrast material and image postprocessing.  DATE: 11/27/2023 10:53 PM  ACCESSION: [id number]  DICTATED: 11/27/2023 11:09 PM  INTERPRETATION LOCATION: [facility]    CLINICAL INDICATION: 53 years old Female  with headache, severe on right temple/trigeminal nerve distribution but also diffuse      COMPARISON: CT head 08/27/2021    TECHNIQUE: CT angiogram of the head during contrast bolus infusion were acquired.  Reformatted sagittal, coronal MIP images are provided. 3D volume rendered images using a stand alone workstation are also provided.    FINDINGS:  No intracranial hemorrhage, acute infarct or mass. The osseous structures are unremarkable.    The major arterial structures are normal in appearance. No definite stenosis. No aneurysm visualized.     CTA neck with contrast  Impression  No abscess.  No evidence of high-grade stenosis, aneurysm, or dissection involving the major cervical arteries within the neck.     Narrative  EXAM: Computed tomographic angiography, neck, with contrast material, including noncontrast images, if performed, and image postprocessing on a stand alone workstation.  DATE: 11/27/2023 10:53 PM  ACCESSION: [id number]  DICTATED: 11/27/2023 10:53 PM  INTERPRETATION LOCATION: [facility]    CLINICAL INDICATION: 53 years old Female with right side facial pain      COMPARISON: None    TECHNIQUE: Contiguous axial CT angiogram of the neck during contrast bolus infusion were acquired.  Multiplanar reformatted and MIP images were provided. For selected cases, 3D volume rendered images are also provided.    FINDINGS:  No evidence of dissection or aneurysm in the carotid or vertebral arteries. No hemodynamically significant arterial stenosis.      The soft tissues are unremarkable without lymphadenopathy, mass or abscess. There is no evidence of an unintended radiopaque foreign body. The visualized osseous structures are unremarkable. The airway is patent and midline. The lung apices are unremarkable. The esophagus is somewhat patulous..    In this report, if stenosis of the internal carotid artery is reported, it is calculated based on the NASCET method: (1 -  (minimal residual diameter/normal diameter of distal ICA)).   Medications administered this visit   @MEDADMIN @ Procedures   Procedures  Documentation assistance was provided by Max Caza, Scribe on November 27, 2023 at 8:14 PM for [place], [place].  Documentation assistance was provided by the scribe in my presence.  The documentation recorded by the scribe has been reviewed by me and accurately reflects the services I personally performed.        [  1] Past Medical History: Diagnosis Date  . ADD (attention deficit disorder)    adderall  . Anxiety   . Depression    sertraline  . Depressive disorder   . Gestational diabetes (HHS-HCC)   . IBS (irritable bowel syndrome)   [2] Patient Active Problem List Diagnosis  . ADD (attention deficit disorder)  . Cyclothymic disorder  . Chronic fatigue and malaise  . Irritability and anger  . 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 De Quervain's tenosynovitis  . Chronic pain of left wrist  . Fibromyalgia  . Hypercholesterolemia  . Small fiber neuropathy  [3] Past Surgical History: Procedure Laterality Date  . birth mark removal    . CESAREAN SECTION  2002  . FOOT SURGERY Left   . PR REMOVAL OF TONSILS,12+ Y/O Bilateral 02/23/2018   Procedure: TONSILLECTOMY, PRIMARY OR SECONDARY; AGE 54 OR OVER;  Surgeon: Wanda Almarie Altes, MD;  Location: [facility];  Service: ENT  . REPEAT CESAREAN SECTION  2010   with BTL  [4] Family History Problem Relation Age of Onset  . Diabetes Mother   . Diabetes Father   . Alcohol abuse Other        FAMILY H/O  . Cancer Other        FAMILY H/O   . Stroke Other        FAMILY H/O  . Diabetes Other        FAMILY H/O   . Ovarian cancer Paternal Grandfather   [5] Social History Tobacco Use  . Smoking status: Former    Current packs/day: 0.00    Average packs/day: 0.3 packs/day for 1 year (0.3 ttl pk-yrs)    Types: Cigarettes    Start date: 11/14/1992     Quit date: 11/14/1993    Years since quitting: 30.0    Passive exposure: Past  . Smokeless tobacco: Never  Vaping Use  . Vaping status: Never Used  Substance Use Topics  . Alcohol use: Yes    Alcohol/week: 3.0 standard drinks of alcohol    Types: 3 Standard drinks or equivalent per week    Comment: occasionally  . Drug use: No   Georjean Falling [place], [place] 11/29/23 1300

## 2023-12-05 ENCOUNTER — Inpatient Hospital Stay
Admission: EM | Admit: 2023-12-05 | Discharge: 2023-12-06 | DRG: 074 | Disposition: A | Discharge status: Other Acute Inpatient Hospital | Source: Other Acute Inpatient Hospital | Attending: Hospitalist | Admitting: Hospitalist

## 2023-12-05 ENCOUNTER — Emergency Department

## 2023-12-05 ENCOUNTER — Other Ambulatory Visit: Payer: Self-pay

## 2023-12-05 DIAGNOSIS — F419 Anxiety disorder, unspecified: Secondary | ICD-10-CM | POA: Diagnosis present

## 2023-12-05 DIAGNOSIS — G379 Demyelinating disease of central nervous system, unspecified: Secondary | ICD-10-CM

## 2023-12-05 DIAGNOSIS — Z79899 Other long term (current) drug therapy: Secondary | ICD-10-CM

## 2023-12-05 DIAGNOSIS — Z880 Allergy status to penicillin: Secondary | ICD-10-CM | POA: Diagnosis not present

## 2023-12-05 DIAGNOSIS — Z791 Long term (current) use of non-steroidal anti-inflammatories (NSAID): Secondary | ICD-10-CM | POA: Diagnosis not present

## 2023-12-05 DIAGNOSIS — Z87891 Personal history of nicotine dependence: Secondary | ICD-10-CM

## 2023-12-05 DIAGNOSIS — R194 Change in bowel habit: Secondary | ICD-10-CM | POA: Diagnosis not present

## 2023-12-05 DIAGNOSIS — Z833 Family history of diabetes mellitus: Secondary | ICD-10-CM | POA: Diagnosis not present

## 2023-12-05 DIAGNOSIS — G360 Neuromyelitis optica [Devic]: Secondary | ICD-10-CM | POA: Diagnosis present

## 2023-12-05 DIAGNOSIS — Z881 Allergy status to other antibiotic agents status: Secondary | ICD-10-CM

## 2023-12-05 DIAGNOSIS — G373 Acute transverse myelitis in demyelinating disease of central nervous system: Secondary | ICD-10-CM | POA: Diagnosis not present

## 2023-12-05 DIAGNOSIS — K641 Second degree hemorrhoids: Secondary | ICD-10-CM | POA: Diagnosis not present

## 2023-12-05 DIAGNOSIS — G500 Trigeminal neuralgia: Principal | ICD-10-CM | POA: Diagnosis present

## 2023-12-05 DIAGNOSIS — F418 Other specified anxiety disorders: Secondary | ICD-10-CM | POA: Diagnosis not present

## 2023-12-05 DIAGNOSIS — R933 Abnormal findings on diagnostic imaging of other parts of digestive tract: Secondary | ICD-10-CM | POA: Diagnosis not present

## 2023-12-05 DIAGNOSIS — R519 Headache, unspecified: Principal | ICD-10-CM | POA: Insufficient documentation

## 2023-12-05 LAB — BASIC METABOLIC PANEL WITH GFR
Anion gap: 15 (ref 5–15)
BUN: 18 mg/dL (ref 6–20)
CO2: 26 mmol/L (ref 22–32)
Calcium: 9 mg/dL (ref 8.9–10.3)
Chloride: 101 mmol/L (ref 98–111)
Creatinine, Ser: 0.91 mg/dL (ref 0.44–1.00)
GFR, Estimated: >60 mL/min (ref >60)
Glucose, Bld: 111 mg/dL — ABNORMAL HIGH (ref 70–99)
Potassium: 3.8 mmol/L (ref 3.5–5.1)
Sodium: 142 mmol/L (ref 135–145)

## 2023-12-05 LAB — CBC WITH DIFFERENTIAL/PLATELET
Abs Immature Granulocytes: 0.03 K/uL (ref 0.00–0.07)
Basophils Absolute: 0.1 K/uL (ref 0.0–0.1)
Basophils Relative: 1 %
Eosinophils Absolute: 0.2 K/uL (ref 0.0–0.5)
Eosinophils Relative: 2 %
HCT: 39.2 % (ref 36.0–46.0)
Hemoglobin: 13.3 g/dL (ref 12.0–15.0)
Immature Granulocytes: 0 %
Lymphocytes Relative: 29 %
Lymphs Abs: 2.7 K/uL (ref 0.7–4.0)
MCH: 30.4 pg (ref 26.0–34.0)
MCHC: 33.9 g/dL (ref 30.0–36.0)
MCV: 89.7 fL (ref 80.0–100.0)
Monocytes Absolute: 0.9 K/uL (ref 0.1–1.0)
Monocytes Relative: 10 %
Neutro Abs: 5.5 K/uL (ref 1.7–7.7)
Neutrophils Relative %: 58 %
Platelets: 271 K/uL (ref 150–400)
RBC: 4.37 MIL/uL (ref 3.87–5.11)
RDW: 11.5 % (ref 11.5–15.5)
WBC: 9.4 K/uL (ref 4.0–10.5)
nRBC: 0 % (ref 0.0–0.2)

## 2023-12-05 LAB — SEDIMENTATION RATE: Sed Rate: 12 mm/h (ref 0–30)

## 2023-12-05 LAB — C-REACTIVE PROTEIN: CRP: 0.5 mg/dL (ref <1.0)

## 2023-12-05 LAB — HIV ANTIBODY (ROUTINE TESTING W REFLEX): HIV Screen 4th Generation wRfx: NONREACTIVE

## 2023-12-05 MED ORDER — PANTOPRAZOLE SODIUM 40 MG IV SOLR
40.0000 mg | INTRAVENOUS | Status: DC
  Administered 2023-12-05: 40 mg via INTRAVENOUS
  Filled 2023-12-05 (×2): qty 10

## 2023-12-05 MED ORDER — OXYCODONE HCL 5 MG PO TABS
5.0000 mg | ORAL_TABLET | Freq: Once | ORAL | Status: AC
  Administered 2023-12-05: 5 mg via ORAL
  Filled 2023-12-05: qty 1

## 2023-12-05 MED ORDER — LORAZEPAM 2 MG/ML IJ SOLN: 0.5000 mg | Freq: Four times a day (QID) | INTRAMUSCULAR | Status: DC | PRN

## 2023-12-05 MED ORDER — LAMOTRIGINE 100 MG PO TABS
100.0000 mg | ORAL_TABLET | Freq: Two times a day (BID) | ORAL | Status: DC
Start: 2023-12-05 — End: 2023-12-05

## 2023-12-05 MED ORDER — FENTANYL CITRATE PF 50 MCG/ML IJ SOSY
50.0000 ug | PREFILLED_SYRINGE | INTRAMUSCULAR | Status: AC
  Administered 2023-12-05: 50 ug via INTRAVENOUS
  Filled 2023-12-05: qty 1

## 2023-12-05 MED ORDER — ONDANSETRON HCL 4 MG/2ML IJ SOLN: 4.0000 mg | Freq: Four times a day (QID) | INTRAMUSCULAR | Status: DC | PRN

## 2023-12-05 MED ORDER — PROPRANOLOL HCL 20 MG PO TABS
20.0000 mg | ORAL_TABLET | Freq: Three times a day (TID) | ORAL | Status: DC
Start: 2023-12-05 — End: 2023-12-05

## 2023-12-05 MED ORDER — ACETAMINOPHEN 325 MG PO TABS: 650.0000 mg | ORAL_TABLET | Freq: Four times a day (QID) | ORAL | Status: DC | PRN

## 2023-12-05 MED ORDER — GADOBUTROL 1 MMOL/ML IV SOLN
7.0000 mL | Freq: Once | INTRAVENOUS | Status: AC | PRN
  Administered 2023-12-05: 7 mL via INTRAVENOUS

## 2023-12-05 MED ORDER — SODIUM CHLORIDE 0.9 % IV SOLN
1000.0000 mg | INTRAVENOUS | Status: DC
  Administered 2023-12-05 – 2023-12-06 (×2): 1000 mg via INTRAVENOUS
  Filled 2023-12-05 (×2): qty 16

## 2023-12-05 MED ORDER — TRAZODONE HCL 50 MG PO TABS: 50.0000 mg | ORAL_TABLET | Freq: Every evening | ORAL | Status: DC | PRN

## 2023-12-05 MED ORDER — CLONAZEPAM 0.5 MG PO TABS
0.5000 mg | ORAL_TABLET | Freq: Three times a day (TID) | ORAL | Status: DC | PRN
Start: 2023-12-05 — End: 2023-12-05

## 2023-12-05 MED ORDER — GABAPENTIN 100 MG PO CAPS: 200.0000 mg | ORAL_CAPSULE | Freq: Three times a day (TID) | ORAL | Status: DC

## 2023-12-05 MED ORDER — PROCHLORPERAZINE EDISYLATE 10 MG/2ML IJ SOLN
10.0000 mg | Freq: Once | INTRAMUSCULAR | Status: AC
  Administered 2023-12-05: 10 mg via INTRAVENOUS
  Filled 2023-12-05: qty 2

## 2023-12-05 MED ORDER — LORAZEPAM 1 MG PO TABS
1.0000 mg | ORAL_TABLET | Freq: Four times a day (QID) | ORAL | Status: DC | PRN
  Administered 2023-12-06: 1 mg via ORAL
  Filled 2023-12-05: qty 1

## 2023-12-05 MED ORDER — ACETAMINOPHEN 650 MG RE SUPP: 650.0000 mg | Freq: Four times a day (QID) | RECTAL | Status: DC | PRN

## 2023-12-05 MED ORDER — SODIUM CHLORIDE 0.9 % IV BOLUS
1000.0000 mL | Freq: Once | INTRAVENOUS | Status: AC
  Administered 2023-12-05: 1000 mL via INTRAVENOUS

## 2023-12-05 MED ORDER — GABAPENTIN 300 MG PO CAPS
800.0000 mg | ORAL_CAPSULE | Freq: Three times a day (TID) | ORAL | Status: DC
  Administered 2023-12-05 – 2023-12-06 (×4): 800 mg via ORAL
  Filled 2023-12-05 (×4): qty 2

## 2023-12-05 MED ORDER — CARBAMAZEPINE 200 MG PO TABS
200.0000 mg | ORAL_TABLET | Freq: Three times a day (TID) | ORAL | Status: DC
  Administered 2023-12-05 – 2023-12-06 (×5): 200 mg via ORAL
  Filled 2023-12-05 (×5): qty 1

## 2023-12-05 MED ORDER — ONDANSETRON HCL 4 MG PO TABS: 4.0000 mg | ORAL_TABLET | Freq: Four times a day (QID) | ORAL | Status: DC | PRN

## 2023-12-05 MED ORDER — RISPERIDONE 1 MG PO TABS
0.5000 mg | ORAL_TABLET | Freq: Every day | ORAL | Status: DC
Start: 2023-12-05 — End: 2023-12-05

## 2023-12-05 MED ORDER — OXYCODONE HCL 5 MG PO TABS
5.0000 mg | ORAL_TABLET | Freq: Four times a day (QID) | ORAL | Status: DC | PRN
  Administered 2023-12-05 – 2023-12-06 (×2): 5 mg via ORAL
  Filled 2023-12-05 (×2): qty 1

## 2023-12-05 MED ORDER — FLUOXETINE HCL 20 MG PO CAPS
20.0000 mg | ORAL_CAPSULE | Freq: Every day | ORAL | Status: DC
Start: 2023-12-05 — End: 2023-12-05

## 2023-12-05 MED ORDER — IPRATROPIUM-ALBUTEROL 0.5-2.5 (3) MG/3ML IN SOLN: 3.0000 mL | Freq: Four times a day (QID) | RESPIRATORY_TRACT | Status: DC | PRN

## 2023-12-05 MED ORDER — HYDROMORPHONE HCL 1 MG/ML IJ SOLN
0.5000 mg | INTRAMUSCULAR | Status: DC | PRN
  Administered 2023-12-05 (×3): 0.5 mg via INTRAVENOUS
  Filled 2023-12-05 (×3): qty 0.5

## 2023-12-05 NOTE — Discharge Instructions (Signed)
 I think that your headache does fit the description for trigeminal neuralgia.  Continue taking medications as prescribed by your previous doctor.  I added on the oxycodone  which can help with pain.  Please contact Dr. Arthea Farrow neurologist for an appointment, and asked to be put on the waiting list to see if he can be moved up.  Thank you for choosing us  for your health care today!  Please see your primary doctor this week for a follow up appointment.   If you have any new, worsening, or unexpected symptoms call your doctor right away or come back to the emergency department for reevaluation.  It was my pleasure to care for you today.   Ginnie EDISON Cyrena, MD

## 2023-12-05 NOTE — Progress Notes (Signed)
NIF -40cmH2O

## 2023-12-05 NOTE — ED Notes (Signed)
 Pt ambulated independently with steady gait to restroom & back to ED stretcher with steady gait. Pt up to side of bed eating dinner tray.

## 2023-12-05 NOTE — ED Triage Notes (Signed)
 Pt arrived via [facility] with CC of headache and dizziness. Per EMS, the headache has been ongoing for several months, but tonight the pain is 10/10. EMS reports that pain is described as stabbing and burning pain and is on the right side of the head. Pt states that it hurts to open right eye.

## 2023-12-05 NOTE — ED Provider Notes (Signed)
 [facility] Provider Note    Event Date/Time   First MD Initiated Contact with Patient 12/05/23 [phone]     (approximate)   History   No chief complaint on file.   HPI  [place] Dalporto is a 53 y.o. female   Past medical history of no significant past medical history has been suffering from a right sided severe headache for the past 1 whole month, diagnosed with probable trigeminal neuralgia last week at [facility] and started on medications which have not been helping.  She has a follow-up with neurology but not until next month.  Denies trauma or obvious inciting event.  Ongoing headache with occasional paroxysms of severe pain that starts at her right side forehead and travels all the way down to her jaw.  No vision changes.  No neurologic changes.  No abnormal sweating or tearing.  No skin rashes or mouth lesions.   External Medical Documents Reviewed: Hospital notes from [facility], CT angiogram of the head and neck negative      Physical Exam   Triage Vital Signs: ED Triage Vitals  Encounter Vitals Group     BP 12/05/23 0235 (!) 153/95     Girls Systolic BP Percentile --      Girls Diastolic BP Percentile --      Boys Systolic BP Percentile --      Boys Diastolic BP Percentile --      Pulse Rate 12/05/23 0235 78     Resp 12/05/23 0235 18     Temp 12/05/23 0235 98.2 F (36.8 C)     Temp Source 12/05/23 0235 Oral     SpO2 12/05/23 0232 98 %     Weight 12/05/23 0236 144 lb (65.3 kg)     Height 12/05/23 0236 5' 2 (1.575 m)     Head Circumference --      Peak Flow --      Pain Score 12/05/23 0236 10     Pain Loc --      Pain Education --      Exclude from Growth Chart --     Most recent vital signs: Vitals:   12/05/23 0400 12/05/23 0500  BP: (!) 131/90 126/89  Pulse: 79 76  Resp: 17 15  Temp:    SpO2: 100% 96%    General: Awake, no distress.  CV:  Good peripheral perfusion.  Resp:  Normal effort.  Abd:  No distention.   Other:  Screaming out in pain.  Uncomfortable appearing.  Hyperesthesia pain with very light touch to the right side of the face.  Extraocular movements intact pupils equal round reactive no skin lesions to the face ear nose or mouth.  Neurologically intact without facial asymmetry, dysarthria motor or sensory deficits of finger-nose coordination.   ED Results / Procedures / Treatments   Labs (all labs ordered are listed, but only abnormal results are displayed) Labs Reviewed  BASIC METABOLIC PANEL WITH GFR  CBC WITH DIFFERENTIAL/PLATELET      RADIOLOGY I independently reviewed and interpreted MRI of the brain and see no mass lesions or midline shift I also reviewed radiologist's formal read.   PROCEDURES:  Critical Care performed: No  Procedures   MEDICATIONS ORDERED IN ED: Medications  sodium chloride  0.9 % bolus 1,000 mL (0 mLs Intravenous Stopped 12/05/23 0411)  prochlorperazine  (COMPAZINE ) injection 10 mg (10 mg Intravenous Given 12/05/23 0250)  fentaNYL  (SUBLIMAZE ) injection 50 mcg (50 mcg Intravenous Given 12/05/23 0253)  oxyCODONE  (Oxy IR/ROXICODONE )  immediate release tablet 5 mg (5 mg Oral Given 12/05/23 0411)  gadobutrol  (GADAVIST ) 1 MMOL/ML injection 7 mL (7 mLs Intravenous Contrast Given 12/05/23 0557)     IMPRESSION / MDM / ASSESSMENT AND PLAN / ED COURSE  I reviewed the triage vital signs and the nursing notes.                                Patient's presentation is most consistent with acute presentation with potential threat to life or bodily function.  Differential diagnosis includes, but is not limited to, trigeminal neuralgia, cluster headache, migraine headache, considered but less likely dissection, stroke, ICH, temporal arteritis   The patient is on the cardiac monitor to evaluate for evidence of arrhythmia and/or significant heart rate changes.  MDM:    I do think she has trigeminal neuralgia.  Her symptoms match.  She had a workup from [facility]  including CT angiogram of the head and neck, as well as a note stating that her inflammatory markers were negative, especially with this recent imaging and workup and no temporal tenderness (reassessed after initial fentanyl  dosing helped significantly with pain) nor vision changes or neurologic deficits I think vasculitis, ICH, stroke, dissection unlikely.  Fentanyl  helped with pain, resting more comfortably.  She has upcoming appointment with neurologist, I will give her oxycodone  prescription to tide her over until then.  She will continue taking her previously prescribed medications.  I ordered for an MRI with and without contrast to look for other potential causes that might change treatment course including mass lesions, inflammatory changes like MS, etc.  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  Recommended completion MRI of the T and L-spine with and without contrast but will require a washout.  Given the recent contrast administration for the MRI of the brain.  The remainder of the brain MRI was without acute abnormalities.   I was able to get in touch with Dr. Elida Ross who will be on-call for neurology this morning.  She will evaluate the patient when she gets in, and does recommend hospital admission and so I have paged for hospital medicine now.     FINAL CLINICAL IMPRESSION(S) / ED DIAGNOSES   Final diagnoses:  Bad headache  Trigeminal neuralgia  Neuromyelitis optica spectrum disorder (HCC)     Rx / DC Orders   ED Discharge Orders     None        Note:  This document was prepared using Dragon voice recognition software and may include unintentional dictation errors.    Cyrena Mylar, MD 12/05/23 [phone]

## 2023-12-05 NOTE — ED Notes (Signed)
 Pt sitting up on bedside eating lunch tray. No complaints at this time.

## 2023-12-05 NOTE — ED Notes (Signed)
 Patient transported to MRI

## 2023-12-05 NOTE — Consult Note (Incomplete)
***  note in progress  Recommendations: - Solumedrol 1g q 24 hrs x5 days end date 9/27 - Increase carbamazepine  to 200mg  tid - Increase gabapentin  to 800mg  tid - D/c lamotrigine , patient is no longer taking - OK to use prn narcotics until pain is better-controlled - MRI orbits, c/t/l spine all wwo contrast. This will have to be done tomorrow bc she already got gadolinium today. - [facility] TEST ID #[id number] CNS Demyelinating Disease Evaluation Profile, Serum (incl anti-MOG and aquaporin 4) - ordered

## 2023-12-05 NOTE — H&P (Signed)
 History and Physical    Patricia Patton FMW:[id number] DOB: [birth year] DOA: 12/05/2023  PCP: Waylan Maxwell, MD (Confirm with patient/family/NH records and if not entered, this has to be entered at [facility] point of entry) Patient coming from: Home  I have personally briefly reviewed patient's old medical records in [facility]  Chief Complaint: Worsening of headaches, facial pain and shoulder pain  HPI: Patricia Patton is a 53 y.o. female with medical history significant of recently diagnosed trigeminal neuralgia, anxiety/depression, presented with worsening of headache, left-sided face pain and bilateral shoulder pain.  Symptoms started 2 months ago, when patient first had left face and forehead tingling sensation, he uses some warm compressing and Tylenol  with some help.  SHe was diagnosed with neuropathic pain associated with shingles  and was started on Valtrex and increasing dose of baseline gabapentin , her symptoms however were not improving, the headache spreading to both sides of the forehead and recently 2 to 3 weeks ago he started develop bilateral shoulder pain as well.  Patient also told me that she has bilateral fingertips tingling sensations was diagnosed with small fiber neuropathy for which she been taking low-dose of gabapentin  chronically.  Denied any vision problem no fever chills.  Denied any lower extremity weakness numbness or tingling sensations.  ED Course: Afebrile, nontachycardic hypotension not hypoxic.  Brain MRI showed no significant intracranial but abnormal T2 signal throughout the visualized cervical cord and abnormal enhancement along the right lateral and left posterior lateral surface of the upper cervical spinal cord concerning for demyelinating disease.  Neurology was consulted.  Review of Systems: As per HPI otherwise 14 point review of systems negative.    Past Medical History:  Diagnosis Date   Anxiety    Bell's palsy    Strep throat  2018    Past Surgical History:  Procedure Laterality Date   CESAREAN SECTION     FOOT SURGERY Bilateral    TONSILLECTOMY       reports that she has quit smoking. She has never used smokeless tobacco. She reports current alcohol use. She reports current drug use. Drug: Marijuana.  Allergies  Allergen Reactions   Amoxicillin-Pot Clavulanate Hives and Swelling    Specifically Augmentin     Family History  Problem Relation Age of Onset   Diabetes Mother    Diabetes Father      Prior to Admission medications   Medication Sig Start Date End Date Taking? Authorizing Provider  carbamazepine  (TEGRETOL ) 100 MG chewable tablet Chew 100 mg by mouth 3 (three) times daily. 11/28/23 12/28/23 Yes [provider]  DULoxetine  (CYMBALTA ) 60 MG capsule Take 60 mg by mouth daily. 11/08/23 11/07/24 Yes [provider]  meloxicam (MOBIC) 15 MG tablet Take 15 mg by mouth daily. 10/31/23  Yes [provider]  oxyCODONE  (OXY IR/ROXICODONE ) 5 MG immediate release tablet Take 5 mg by mouth every 6 (six) hours as needed. 10/13/23  Yes [provider]  oxyCODONE -acetaminophen  (PERCOCET/ROXICET) 5-325 MG tablet Take 1 tablet by mouth every 8 (eight) hours as needed. 11/25/23  Yes [provider]  traZODone  (DESYREL ) 150 MG tablet Take 150 mg by mouth at bedtime. 11/08/23 11/07/24 Yes [provider]  valACYclovir (VALTREX) 1000 MG tablet Take 1,000 mg by mouth 3 (three) times daily. 10/13/23  Yes [provider]  ZEPBOUND 2.5 MG/0.5ML Pen Inject 2.5 mg into the skin once a week. 08/04/23  Yes [provider]  albuterol  (VENTOLIN  HFA) 108 (90 Base) MCG/ACT inhaler Inhale into  the lungs every 6 (six) hours as needed for wheezing or shortness of breath.    [provider]  clonazePAM  (KLONOPIN ) 0.5 MG tablet Take 0.25-5 mg by mouth daily as needed (severe anxiety).  08/13/18   [provider]  FLUoxetine  (PROZAC ) 20 MG capsule Take 20 mg  by mouth daily.    [provider]  ipratropium-albuterol  (DUONEB) 0.5-2.5 (3) MG/3ML SOLN Take 3 mLs by nebulization.    [provider]  lamoTRIgine  (LAMICTAL ) 100 MG tablet Take 100 mg by mouth 2 (two) times daily. 09/26/18   [provider]  propranolol  (INDERAL ) 20 MG tablet Take 20 mg by mouth 3 (three) times daily. 06/27/18   [provider]  risperiDONE  (RISPERDAL ) 0.5 MG tablet Take 0.5 mg by mouth at bedtime. 09/26/18   [provider]  traZODone  (DESYREL ) 50 MG tablet Take 50-100 mg by mouth at bedtime as needed for sleep. 08/13/18   [provider]    Physical Exam: Vitals:   12/05/23 0500 12/05/23 0713 12/05/23 0809 12/05/23 1001  BP: 126/89  124/87 133/87  Pulse: 76  79 70  Resp: 15  16 15   Temp:  98.1 F (36.7 C) 98.1 F (36.7 C)   TempSrc:  Oral Oral   SpO2: 96%  100% 100%  Weight:      Height:        Constitutional: NAD, calm, comfortable Vitals:   12/05/23 0500 12/05/23 0713 12/05/23 0809 12/05/23 1001  BP: 126/89  124/87 133/87  Pulse: 76  79 70  Resp: 15  16 15   Temp:  98.1 F (36.7 C) 98.1 F (36.7 C)   TempSrc:  Oral Oral   SpO2: 96%  100% 100%  Weight:      Height:       Eyes: PERRL, lids and conjunctivae normal ENMT: Mucous membranes are moist. Posterior pharynx clear of any exudate or lesions.Normal dentition.  Neck: normal, supple, no masses, no thyromegaly Respiratory: clear to auscultation bilaterally, no wheezing, no crackles. Normal respiratory effort. No accessory muscle use.  Cardiovascular: Regular rate and rhythm, no murmurs / rubs / gallops. No extremity edema. 2+ pedal pulses. No carotid bruits.  Abdomen: no tenderness, no masses palpated. No hepatosplenomegaly. Bowel sounds positive.  Musculoskeletal: no clubbing / cyanosis. No joint deformity upper and lower extremities. Good ROM, no contractures. Normal muscle tone.  Skin: no rashes, lesions, ulcers. No induration Neurologic: CN 2-12  grossly intact. Sensation intact, DTR normal. Strength 5/5 in all 4.  Psychiatric: Normal judgment and insight. Alert and oriented x 3. Normal mood.    Labs on Admission: I have personally reviewed following labs and imaging studies  CBC: Recent Labs  Lab 12/05/23 0240  WBC 9.4  NEUTROABS 5.5  HGB 13.3  HCT 39.2  MCV 89.7  PLT 271   Basic Metabolic Panel: Recent Labs  Lab 12/05/23 0240  NA 142  K 3.8  CL 101  CO2 26  GLUCOSE 111*  BUN 18  CREATININE 0.91  CALCIUM  9.0   GFR: Estimated Creatinine Clearance: 64.2 mL/min (by C-G formula based on SCr of 0.91 mg/dL). Liver Function Tests: No results for input(s): AST, ALT, ALKPHOS, BILITOT, PROT, ALBUMIN  in the last 168 hours. No results for input(s): LIPASE, AMYLASE in the last 168 hours. No results for input(s): AMMONIA in the last 168 hours. Coagulation Profile: No results for input(s): INR, PROTIME in the last 168 hours. Cardiac Enzymes: No results for input(s): CKTOTAL, CKMB, CKMBINDEX, TROPONINI in the last 168  hours. BNP (last 3 results) No results for input(s): PROBNP in the last 8760 hours. HbA1C: No results for input(s): HGBA1C in the last 72 hours. CBG: No results for input(s): GLUCAP in the last 168 hours. Lipid Profile: No results for input(s): CHOL, HDL, LDLCALC, TRIG, CHOLHDL, LDLDIRECT in the last 72 hours. Thyroid Function Tests: No results for input(s): TSH, T4TOTAL, FREET4, T3FREE, THYROIDAB in the last 72 hours. Anemia Panel: No results for input(s): VITAMINB12, FOLATE, FERRITIN, TIBC, IRON, RETICCTPCT in the last 72 hours. Urine analysis:    Component Value Date/Time   COLORURINE YELLOW (A) 09/30/2018 1525   APPEARANCEUR HAZY (A) 09/30/2018 1525   LABSPEC 1.023 09/30/2018 1525   PHURINE 5.0 09/30/2018 1525   GLUCOSEU NEGATIVE 09/30/2018 1525   HGBUR SMALL (A) 09/30/2018 1525   BILIRUBINUR NEGATIVE 09/30/2018 1525    KETONESUR 5 (A) 09/30/2018 1525   PROTEINUR NEGATIVE 09/30/2018 1525   NITRITE NEGATIVE 09/30/2018 1525   LEUKOCYTESUR NEGATIVE 09/30/2018 1525    Radiological Exams on Admission: MR Brain W and Wo Contrast Result Date: 12/05/2023 EXAM: MRI BRAIN WITH AND WITHOUT CONTRAST 12/05/2023 05:56:58 AM TECHNIQUE: Multiplanar multisequence MRI of the head/brain was performed with and without the administration of intravenous contrast. COMPARISON: None available. CLINICAL HISTORY: Trigeminal neuralgia suspected, check for mass lesions evidence of multiple sclerosis. 7ml gadavist . Arrived via [facility] with CC of headache and dizziness. Per EMS, the headache has been ongoing for several months, but tonight the pain is 10/10. EMS reports that pain is described as stabbing and burning pain and is on the right side of the head. Pt states that it hurts to open right eye. FINDINGS: BRAIN AND VENTRICLES: No acute infarct. No acute intracranial hemorrhage. No mass effect or midline shift. No hydrocephalus. The sella is unremarkable. Normal flow voids. There is mild subcortical white matter disease present bilaterally. ORBITS: The optic nerves appear normal in morphology and signal intensity and demonstrate no abnormal enhancement. SINUSES: No acute abnormality. BONES AND SOFT TISSUES: Normal bone marrow signal and enhancement. No acute soft tissue abnormality. There is increased T2 signal present throughout the visualized spinal cord at the cervical medullary junction. There is also abnormal enhancement along the right lateral and left posterolateral surfaces of the upper cervical spinal cord. Follow up MRI of the cervical, thoracic and lumbar spine is recommended without and with gadolinium contrast. IMPRESSION: 1. Abnormal T2 signal throughout the visualized cervical cord and abnormal enhancement along the right lateral and left posterolateral surfaces of the upper cervical spinal cord. The findings are concerning for  demyelinating disease, but neoplasm cannot be excluded. Recommend correlation with dedicated MRI of the cervical, thoracic, and lumbar spine without and with gadolinium contrast 2. The above findings were discussed with Dr. Cyrena at 6:13 am 12/05/2023 Electronically signed by: Evalene Coho MD 12/05/2023 06:23 AM EDT RP Workstation: [id number]    EKG: None  Assessment/Plan Principal Problem:   Demyelinating disease (HCC) Active Problems:   Intractable headache  (please populate well all problems here in Problem List. (For example, if patient is on BP meds at home and you resume or decide to hold them, it is a problem that needs to be her. Same for CAD, COPD, HLD and so on)  Intractable headache Intractable bilateral shoulder pain - Image study of brain MRI incidental findings of conditions compatible with demyelinating diseases.  Patient denied such history of demyelinating conditions in the family. - Neurology consulted, 1 dose of Solu-Medrol  given. - Patient had MRI with contrast today, cervical  spine, thoracic spine and lumbar spine MRI postponed to tomorrow. - Symptomatic management, continue gabapentin , Tegretol  and narcotics for pain -NIF  Anxiety/depression - Continue at bedtime risperidone  trazodone   DVT prophylaxis: SCD Code Status: Full code Family Communication: None at bedside Disposition Plan: Patient is sick with new onset of demyelinating disease of spinal cord, requiring inpatient neurology consultation and inpatient workup and treatment, expect more than 2 midnight hospital stay Consults called: Neurology Admission status: MedSurg admission   Cort ONEIDA Mana MD [facility] Pager [phone]  12/05/2023, 10:51 AM

## 2023-12-06 ENCOUNTER — Inpatient Hospital Stay (HOSPITAL_COMMUNITY)
Admission: EM | Admit: 2023-12-06 | Discharge: 2023-12-21 | DRG: 099 | Disposition: A | Source: Other Acute Inpatient Hospital | Attending: Family Medicine | Admitting: Family Medicine

## 2023-12-06 ENCOUNTER — Encounter (HOSPITAL_COMMUNITY): Payer: Self-pay

## 2023-12-06 ENCOUNTER — Encounter (HOSPITAL_COMMUNITY): Payer: Self-pay | Admitting: Internal Medicine

## 2023-12-06 ENCOUNTER — Inpatient Hospital Stay

## 2023-12-06 ENCOUNTER — Other Ambulatory Visit: Payer: Self-pay

## 2023-12-06 DIAGNOSIS — T421X5A Adverse effect of iminostilbenes, initial encounter: Secondary | ICD-10-CM | POA: Diagnosis present

## 2023-12-06 DIAGNOSIS — E876 Hypokalemia: Secondary | ICD-10-CM | POA: Diagnosis present

## 2023-12-06 DIAGNOSIS — Z881 Allergy status to other antibiotic agents status: Secondary | ICD-10-CM | POA: Diagnosis not present

## 2023-12-06 DIAGNOSIS — F419 Anxiety disorder, unspecified: Secondary | ICD-10-CM | POA: Diagnosis present

## 2023-12-06 DIAGNOSIS — F909 Attention-deficit hyperactivity disorder, unspecified type: Secondary | ICD-10-CM | POA: Diagnosis present

## 2023-12-06 DIAGNOSIS — F429 Obsessive-compulsive disorder, unspecified: Secondary | ICD-10-CM | POA: Diagnosis present

## 2023-12-06 DIAGNOSIS — F32A Depression, unspecified: Secondary | ICD-10-CM | POA: Diagnosis present

## 2023-12-06 DIAGNOSIS — G500 Trigeminal neuralgia: Secondary | ICD-10-CM | POA: Diagnosis present

## 2023-12-06 DIAGNOSIS — G373 Acute transverse myelitis in demyelinating disease of central nervous system: Secondary | ICD-10-CM | POA: Diagnosis present

## 2023-12-06 DIAGNOSIS — Z880 Allergy status to penicillin: Secondary | ICD-10-CM

## 2023-12-06 DIAGNOSIS — R29810 Facial weakness: Secondary | ICD-10-CM | POA: Diagnosis present

## 2023-12-06 DIAGNOSIS — Z833 Family history of diabetes mellitus: Secondary | ICD-10-CM | POA: Diagnosis not present

## 2023-12-06 DIAGNOSIS — K5900 Constipation, unspecified: Secondary | ICD-10-CM | POA: Diagnosis present

## 2023-12-06 DIAGNOSIS — G629 Polyneuropathy, unspecified: Secondary | ICD-10-CM | POA: Diagnosis present

## 2023-12-06 DIAGNOSIS — G360 Neuromyelitis optica [Devic]: Principal | ICD-10-CM | POA: Diagnosis present

## 2023-12-06 DIAGNOSIS — M797 Fibromyalgia: Secondary | ICD-10-CM | POA: Diagnosis present

## 2023-12-06 DIAGNOSIS — Z79899 Other long term (current) drug therapy: Secondary | ICD-10-CM | POA: Diagnosis not present

## 2023-12-06 DIAGNOSIS — R933 Abnormal findings on diagnostic imaging of other parts of digestive tract: Secondary | ICD-10-CM | POA: Diagnosis not present

## 2023-12-06 DIAGNOSIS — K641 Second degree hemorrhoids: Secondary | ICD-10-CM | POA: Diagnosis present

## 2023-12-06 DIAGNOSIS — D6959 Other secondary thrombocytopenia: Secondary | ICD-10-CM | POA: Diagnosis present

## 2023-12-06 DIAGNOSIS — Z791 Long term (current) use of non-steroidal anti-inflammatories (NSAID): Secondary | ICD-10-CM | POA: Diagnosis not present

## 2023-12-06 DIAGNOSIS — Z87891 Personal history of nicotine dependence: Secondary | ICD-10-CM | POA: Diagnosis not present

## 2023-12-06 DIAGNOSIS — G379 Demyelinating disease of central nervous system, unspecified: Secondary | ICD-10-CM | POA: Diagnosis not present

## 2023-12-06 DIAGNOSIS — F418 Other specified anxiety disorders: Secondary | ICD-10-CM | POA: Diagnosis not present

## 2023-12-06 DIAGNOSIS — G4733 Obstructive sleep apnea (adult) (pediatric): Secondary | ICD-10-CM | POA: Diagnosis present

## 2023-12-06 DIAGNOSIS — R194 Change in bowel habit: Secondary | ICD-10-CM | POA: Diagnosis not present

## 2023-12-06 DIAGNOSIS — R519 Headache, unspecified: Secondary | ICD-10-CM

## 2023-12-06 LAB — MISC LABCORP TEST (SEND OUT): Labcorp test code: 505485

## 2023-12-06 MED ORDER — SODIUM CHLORIDE 0.9 % IV SOLN: 1000.0000 mg | INTRAVENOUS | Status: DC

## 2023-12-06 MED ORDER — GABAPENTIN 300 MG PO CAPS
900.0000 mg | ORAL_CAPSULE | Freq: Three times a day (TID) | ORAL | Status: DC
  Administered 2023-12-06: 900 mg via ORAL
  Filled 2023-12-06: qty 3

## 2023-12-06 MED ORDER — ENOXAPARIN SODIUM 40 MG/0.4ML IJ SOSY: 40.0000 mg | PREFILLED_SYRINGE | INTRAMUSCULAR | Status: DC

## 2023-12-06 MED ORDER — PANTOPRAZOLE SODIUM 40 MG IV SOLR: 40.0000 mg | INTRAVENOUS | Status: DC

## 2023-12-06 MED ORDER — GADOBUTROL 1 MMOL/ML IV SOLN
7.0000 mL | Freq: Once | INTRAVENOUS | Status: AC | PRN
  Administered 2023-12-06: 7 mL via INTRAVENOUS

## 2023-12-06 NOTE — Plan of Care (Signed)
  Problem: Clinical Measurements: Goal: Ability to maintain clinical measurements within normal limits will improve Outcome: Progressing   Problem: Activity: Goal: Risk for activity intolerance will decrease Outcome: Progressing   Problem: Pain Managment: Goal: General experience of comfort will improve and/or be controlled Outcome: Progressing   Problem: Skin Integrity: Goal: Risk for impaired skin integrity will decrease Outcome: Progressing

## 2023-12-06 NOTE — Consult Note (Addendum)
 NEUROLOGY CONSULT NOTE   Date of service: 12/05/23 Patient Name: Patricia Patton MRN:  [id number] DOB:  12-16-1970 Chief Complaint:  Requesting Provider: Awanda City, MD  History of Present Illness   Patricia Patton is a 53 y.o. female with hx of recently diagnosed trigeminal neuralgia as well as anxiety and depression who presented to the ED with worsening right sided facial pain and bilateral shoulder pain.  She was in her usual state of health until 2 months ago.  She had no pre-existing neurologic conditions other than numbness in her fingertips and her toes which had been previously diagnosed by it out side neurologist as small fiber neuropathy.  She began to have severe paroxysmal facial pain in her right face initially around the V1 region and then spreading to V2 and V3.  The paroxysms of pain were crippling and 10 out of 10 in severity.  They would be triggered reliably by touching her face or brushing her hair.  She initially sought care at [facility] and they suspected that she may be developing shingles and that her rash had just not shown yet.  She was treated with Valtrex and increasing doses of gabapentin .  She never did develop a rash.  The trigeminal neuralgia then spread to the left V1 region.  The right sided pain is still paroxysmal but is becoming near constant and she is unable to function.  Prior to admission she was taking gabapentin  900 mg 3 times daily and carbamazepine  100 mg 3 times daily without significant benefit.  She denies any other neurologic symptoms although she reports pain in her neck and shoulders.  On exam she has bilateral deltoid weakness and is lethargic secondary to narcotics administered in the ED.  MRI brain was performed which showed no significant intracranial abnormalities but did show abnormal T2 signal throughout the visualized cervical cord and abnormal enhancement along the right lateral and left posterolateral surfaces of the upper cervical spinal  cord most concerning for demyelination.   ROS   Comprehensive ROS performed and pertinent positives documented in HPI   Past History   Past Medical History:  Diagnosis Date   Anxiety    Bell's palsy    Strep throat 2018    Past Surgical History:  Procedure Laterality Date   CESAREAN SECTION     FOOT SURGERY Bilateral    TONSILLECTOMY      Family History: Family History  Problem Relation Age of Onset   Diabetes Mother    Diabetes Father     Social History  reports that she has quit smoking. She has never used smokeless tobacco. She reports current alcohol use. She reports current drug use. Drug: Marijuana.  Allergies  Allergen Reactions   Amoxicillin-Pot Clavulanate Hives and Swelling    Specifically Augmentin     Medications   Current Facility-Administered Medications:    acetaminophen  (TYLENOL ) tablet 650 mg, 650 mg, Oral, Q6H PRN **OR** acetaminophen  (TYLENOL ) suppository 650 mg, 650 mg, Rectal, Q6H PRN, Patricia Manor T, MD   carbamazepine  (TEGRETOL ) tablet 200 mg, 200 mg, Oral, TID, Patricia Idler M, MD, 200 mg at 12/06/23 9051   gabapentin  (NEURONTIN ) capsule 800 mg, 800 mg, Oral, TID, Patricia Patricia HERO, MD, 800 mg at 12/06/23 1038   ipratropium-albuterol  (DUONEB) 0.5-2.5 (3) MG/3ML nebulizer solution 3 mL, 3 mL, Nebulization, Q6H PRN, Patricia Patton, Patricia T, MD   LORazepam  (ATIVAN ) injection 0.5 mg, 0.5 mg, Intravenous, Q6H PRN, Patricia Patton, Patricia T, MD   LORazepam  (ATIVAN ) tablet 1 mg, 1  mg, Oral, Q6H PRN, Patricia Manor T, MD, 1 mg at 12/06/23 0948   methylPREDNISolone  sodium succinate (SOLU-MEDROL ) 1,000 mg in sodium chloride  0.9 % 50 mL IVPB, 1,000 mg, Intravenous, Q24H, Last Rate: 66 mL/hr at 12/06/23 1006, 1,000 mg at 12/06/23 1006 **AND** pantoprazole  (PROTONIX ) injection 40 mg, 40 mg, Intravenous, Q24H, Patricia Patricia HERO, MD, 40 mg at December 31, 2023 1015   ondansetron  (ZOFRAN ) tablet 4 mg, 4 mg, Oral, Q6H PRN **OR** ondansetron  (ZOFRAN ) injection 4 mg, 4 mg, Intravenous, Q6H PRN,  Patricia Manor DASEN, MD   oxyCODONE  (Oxy IR/ROXICODONE ) immediate release tablet 5 mg, 5 mg, Oral, Q6H PRN, Patricia Manor T, MD, 5 mg at 12/06/23 0336  Vitals   Vitals:   2023/12/31 1821 12-31-23 2020 12/06/23 0413 12/06/23 0753  BP: 130/82 (!) 140/98 (!) 154/96 (!) 141/94  Pulse: 90 94 (!) 101 98  Resp: 18 16 16 16   Temp: 97.6 F (36.4 C) 98.1 F (36.7 C) 97.8 F (36.6 C) 98.2 F (36.8 C)  TempSrc: Oral   Oral  SpO2: 96% 96% 96% 99%  Weight:      Height:        Body mass index is 26.34 kg/Patton.   Physical Exam   Gen: patient lying in bed, lethargic 2/2 narcotics administered in ED for facial pain CV: extremities appear well-perfused Resp: normal WOB  Neurologic exam MS: lethargic, oriented x4, follows commands Speech: no dysarthria, no aphasia CN: PERRL, VFF, EOMI, sensation intact, face symmetric, hearing intact to voice Motor: 5/5 strength throughout except 4+/5 bilateral deltoids, possibly pain-limited Sensory: stocking glove sensory deficit to PP and vibration in all extremities Reflexes: 2+ symm with toes down bilat Coordination: FNF intact bilat Gait: deferred   Labs/Imaging/Neurodiagnostic studies   CBC:  Recent Labs  Lab 12/31/2023 0240  WBC 9.4  NEUTROABS 5.5  HGB 13.3  HCT 39.2  MCV 89.7  PLT 271   Basic Metabolic Panel:  Lab Results  Component Value Date   NA 142 12-31-2023   K 3.8 2023/12/31   CO2 26 2023/12/31   GLUCOSE 111 (H) 12-31-2023   BUN 18 2023-12-31   CREATININE 0.91 31-Dec-2023   CALCIUM  9.0 12/31/2023   GFRNONAA >60 Dec 31, 2023   GFRAA >60 09/30/2018   Lipid Panel: No results found for: LDLCALC HgbA1c: No results found for: HGBA1C Urine Drug Screen: No results found for: LABOPIA, COCAINSCRNUR, LABBENZ, AMPHETMU, THCU, LABBARB  Alcohol Level No results found for: ETH INR No results found for: INR APTT No results found for: APTT AED levels: No results found for: PHENYTOIN, ZONISAMIDE, LAMOTRIGINE ,  LEVETIRACETA   MRI Brain(Personally reviewed): Abnormal T2 signal throughout the visualized cervical cord and abnormal enhancement along the right lateral and left posterolateral surfaces of the upper cervical spinal cord. The findings are concerning for demyelinating disease, but neoplasm cannot be excluded. Recommend correlation with dedicated MRI of the cervical, thoracic, and lumbar spine without and with gadolinium contrast  ASSESSMENT   Atina Feeley is a 53 y.o. female with onset of R sided trigeminal neuralgia 2 months ago increasing in frequency and severity and now including V1 on L side, neck pain radiating to her bilateral shoulders and symmetric deltoid weakness who presented to ED for intractable facial pain. MRI brain was performed which showed no significant intracranial abnormalities but did show abnormal increased T2 flair signal within the spinal cord extending from the cervical medullary junction to the mid body of C6 with abnormal enhancement posterolaterally on the right at the cervical medullary junction and  posterior laterally on the left at the C1 and C2 vertebral body levels consistent with active demyelination. 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  RECOMMENDATIONS   Recommendations: - Solumedrol 1g q 24 hrs x5 days end date 9/27 - Increase carbamazepine  to 200mg  tid - Increase gabapentin  to 800mg  tid - D/c lamotrigine , patient is no longer taking - OK to use prn narcotics until pain is better-controlled - MRI orbits, C + Patton spine all wwo contrast. This will have to be done tomorrow bc she already got gadolinium today. - [facility] TEST ID #[id number] CNS Demyelinating Disease Evaluation Profile,  Serum (incl anti-MOG and aquaporin 4) - ordered          Will continue to follow closely.  ______________________________________________________________________    Bonney Patricia CHRISTELLA Matthews, MD [facility]  Note: any text copied and pasted into this document was written by me in a separate document not otherwise billed for.

## 2023-12-06 NOTE — Discharge Summary (Signed)
 Physician Discharge Summary   Delbra Zellars [facility]  female DOB: 1971/01/08  FMW:[id number]  PCP: Waylan Maxwell, MD  Admit date: 12/05/2023 Discharge date: 12/06/2023  Admitted From: home Disposition:  [facility] CODE STATUS: Full code   Hospital Course:  For full details, please see H&P, progress notes, consult notes and ancillary notes.  Briefly,  Patricia Patton is a 53 y.o. female with onset of R sided trigeminal neuralgia 2 months ago increasing in frequency and severity and now including V1 on L side, neck pain radiating to her bilateral shoulders who presented to ED for intractable facial pain.   MRI brain was performed which showed no significant intracranial abnormalities but did show abnormal increased T2 flair signal within the spinal cord extending from the cervical medullary junction to the mid body of C6 with abnormal enhancement posterolaterally on the right at the cervical medullary junction and posterior laterally on the left at the C1 and C2 vertebral body levels consistent with active demyelination.    Neuro consulted with Dr. Matthews, who has suspicion for neuromyelitis optica spectrum disorder. Confirmatory autoantibody testing was sent 12/05/23.  Given how extensive the lesion is, neuro is concerned about the possibility of her developing weakness or other neurologic deficits if pt is not maximally aggressive treated.  Pt is therefore continued on high-dose steroids and also transferred to [facility] for concurrent PLEX.   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 Plan for 2-3 consecutive days f/b every other day for total 5 sessions - Solumedrol 1g q 24 hrs x5 days end date 9/27 - Continue carbamazepine  to 200mg  tid - Continue gabapentin  900mg  tid - OK to use prn narcotics until pain is better-controlled - [facility] TEST ID #[id number] CNS Demyelinating Disease Evaluation Profile, Serum (incl anti-MOG and aquaporin 4)  - ordered by neuro --Please notify [facility] neurohospitalist Dr. Sal Khaliqdina upon patient arrival so we can coordinate PLEX immediately.  Anxiety/depression --cont Cymbalta  and trazodone    Unless noted above, medications under STOP list are ones pt was not taking PTA.  Discharge Diagnoses:  Principal Problem:   Demyelinating disease (HCC) Active Problems:   Intractable headache   30 Day Unplanned Readmission Risk Score    Flowsheet Row ED to Hosp-Admission (Current) from 12/05/2023 in [facility] 1C MEDICAL TELEMETRY  30 Day Unplanned Readmission Risk Score (%) 7.27 Filed at 12/06/2023 2000    This score is the patient's risk of an unplanned readmission within 30 days of being discharged (0 -100%). The score is based on dignosis, age, lab data, medications, orders, and past utilization.   Low:  0-14.9   Medium: 15-21.9   High: 22-29.9   Extreme: 30 and above         Discharge Instructions:  Allergies as of 12/06/2023       Reactions   Amoxicillin-pot Clavulanate Hives, Swelling   Specifically Augmentin         Medication List     STOP taking these medications    albuterol  108 (90 Base) MCG/ACT inhaler Commonly known as: VENTOLIN  HFA   clonazePAM  0.5 MG tablet Commonly known as: KLONOPIN    FLUoxetine  20 MG capsule Commonly known as: PROZAC    ipratropium-albuterol  0.5-2.5 (3) MG/3ML Soln Commonly known as: DUONEB   lamoTRIgine  100 MG tablet Commonly known as: LAMICTAL    meloxicam 15 MG tablet Commonly known as: MOBIC   oxyCODONE -acetaminophen  5-325 MG tablet Commonly known as: PERCOCET/ROXICET   propranolol  20 MG tablet Commonly  known as: INDERAL    risperiDONE  0.5 MG tablet Commonly known as: RISPERDAL    valACYclovir 1000 MG tablet Commonly known as: VALTREX       TAKE these medications    carbamazepine  100 MG chewable tablet Commonly known as: TEGRETOL  Chew 100 mg by mouth 3 (three) times daily.   DULoxetine  60 MG  capsule Commonly known as: CYMBALTA  Take 60 mg by mouth daily.   gabapentin  300 MG capsule Commonly known as: NEURONTIN  Take 300 mg by mouth 3 (three) times daily.   methylPREDNISolone  sodium succinate 1,000 mg in sodium chloride  0.9 % 50 mL Inject 1,000 mg into the vein daily. Start taking on: December 07, 2023   oxyCODONE  5 MG immediate release tablet Commonly known as: Oxy IR/ROXICODONE  Take 5 mg by mouth every 6 (six) hours as needed.   pantoprazole  40 MG injection Commonly known as: PROTONIX  Inject 40 mg into the vein daily. Start taking on: December 07, 2023   traZODone  150 MG tablet Commonly known as: DESYREL  Take 150 mg by mouth at bedtime.         Follow-up Information     Waylan Maxwell, MD .   Specialty: Family Medicine Contact information: [address] [facility] [place] [address] [phone]         Lane Arthea BRAVO, MD .   Specialty: Neurology Contact information: [phone] [facility] [address] [facility] [place] [address] [phone]                 Allergies  Allergen Reactions   Amoxicillin-Pot Clavulanate Hives and Swelling    Specifically Augmentin      The results of significant diagnostics from this hospitalization (including imaging, microbiology, ancillary and laboratory) are listed below for reference.   Consultations:   Procedures/Studies: MR ORBITS W WO CONTRAST Result Date: 12/06/2023 EXAM: MRI ORBITS WITHOUT AND WITH CONTRAST 12/06/2023 06:55:38 AM TECHNIQUE: Multiplanar, multisequence MRI of the orbits was performed without and with intravenous contrast. 7 mL gadobutrol  (GADAVIST ) 1 MMOL/ML injection 7 mL GADOBUTROL  1 MMOL/ML IV SOLN. COMPARISON: MRI of the head dated 12/05/2023. CLINICAL HISTORY: Acute onset trigeminal neuralgia bilaterally with findings c/f NMO in c spine. Eval for e/o acute or prior optic neuritis. 7ml gadavist . Worsening of headaches, facial  pain and shoulder pain. Abnormal Brain MRI 12/05/2023. FINDINGS: ORBITS: Normal globes. Lenses are normally located. The optic nerves are normal in morphology and signal intensity. The optic chiasm is unremarkable. Symmetric extraocular muscles. No orbital mass. No abnormal enhancement. No appreciable abnormal cranial nerve enhancement. SINUSES AND MASTOIDS: Clear. BRAIN: There are a few scattered foci of increased T2 signal present within the subcortical white matter bilaterally. There is T2 hyperintensity again demonstrated within the visualized cervical spinal cord and cervical medullary junction. There is persistent abnormal enhancement of the spinal cord on the right at the cervical medullary junction and on the left at the C1 and C2 vertebral body levels. BONES AND SOFT TISSUES: Normal bone marrow signal. No acute soft tissue abnormality. IMPRESSION: 1. No evidence of acute or prior optic neuritis. 2. Persistent abnormal enhancement of the spinal cord on the right at the cervical medullary junction and on the left at the C1 and C2 vertebral body levels, consistent with demyelinating disease. Electronically signed by: Evalene Coho MD 12/06/2023 07:36 AM EDT RP Workstation: [id number]   MR THORACIC SPINE W WO CONTRAST Result Date: 12/06/2023 EXAM: MRI THORACIC SPINE WITHOUT AND WITH INTRAVENOUS CONTRAST 12/06/2023 06:55:38 AM TECHNIQUE: Multiplanar multisequence MRI of  the thoracic spine was performed without and with the administration of intravenous contrast. 7 mL gadobutrol  (GADAVIST ) 1 MMOL/ML injection 7 mL GADOBUTROL  1 MMOL/ML IV SOLN was administered. COMPARISON: None available. CLINICAL HISTORY: Multiple sclerosis (MS). 7ml gadavist . Worsening of headaches, facial pain and shoulder pain. Abnormal Brain MRI 12/05/2023. FINDINGS: BONES AND ALIGNMENT: Normal alignment. Normal vertebral body heights. Bone marrow signal is unremarkable. No abnormal enhancement. SPINAL CORD: The spinal cord is normal in  morphology and signal intensity. There is no abnormal enhancement. There is no evidence of demyelinating disease involving the thoracic spinal cord. SOFT TISSUES: Unremarkable. DEGENERATIVE CHANGES: No significant disc herniation. No spinal canal stenosis or neural foraminal narrowing. IMPRESSION: 1. No evidence of demyelinating disease involving the thoracic spinal cord. Electronically signed by: Evalene Coho MD 12/06/2023 07:26 AM EDT RP Workstation: [id number]   MR Lumbar Spine W Tommye Contrast Result Date: 12/06/2023 EXAM: MRI LUMBAR SPINE 12/06/2023 06:55:38 AM TECHNIQUE: Multiplanar multisequence MRI of the lumbar spine was performed with and without the administration of 7mL gadobutrol  (GADAVIST ) 1 MMOL/ML. COMPARISON: None available. CLINICAL HISTORY: Multiple sclerosis (MS). 7ml gadavist . Worsening of headaches, facial pain and shoulder pain. Abnormal Brain MRI 12/05/2023. FINDINGS: BONES AND ALIGNMENT: Normal alignment. Normal vertebral body heights. Bone marrow signal is unremarkable. SPINAL CORD: The conus terminates normally. SOFT TISSUES: No paraspinal mass. L1-L2: No significant disc herniation. No spinal canal stenosis or neural foraminal narrowing. L2-L3: No significant disc herniation. No spinal canal stenosis or neural foraminal narrowing. L3-L4: No significant disc herniation. No spinal canal stenosis or neural foraminal narrowing. L4-L5: No significant disc herniation. No spinal canal stenosis or neural foraminal narrowing. L5-S1: No significant disc herniation. No spinal canal stenosis or neural foraminal narrowing. IMPRESSION: 1. No spinal canal stenosis or neural foraminal narrowing in the lumbar spine. Electronically signed by: Evalene Coho MD 12/06/2023 07:24 AM EDT RP Workstation: [id number]   MR CERVICAL SPINE W WO CONTRAST Result Date: 12/06/2023 EXAM: MRI CERVICAL SPINE WITH AND WITHOUT CONTRAST 12/06/2023 06:55:38 AM TECHNIQUE: Multiplanar multisequence MRI of the cervical  spine was performed without and with the administration of intravenous contrast. COMPARISON: None available. CLINICAL HISTORY: Multiple sclerosis (MS). 7ml gadavist . Worsening of headaches, facial pain and shoulder pain. Abnormal Brain MRI 12/05/2023. FINDINGS: BONES AND ALIGNMENT: Normal alignment. Normal vertebral body heights. Marrow signal is unremarkable. No abnormal enhancement. SPINAL CORD: There is abnormal increased T2 signal present within the spinal cord extending from the cervical medullary junction to the mid body of C6. There is abnormal enhancement present posterolaterally on the right within the cord at the cervical medullary junction and posterolaterally on the left at the C1 and C2 vertebral body levels. The cord does not appear expanded. SOFT TISSUES: No paraspinal mass. C2-C3: No significant disc herniation. No spinal canal stenosis or neural foraminal narrowing. C3-C4: No significant disc herniation. No spinal canal stenosis or neural foraminal narrowing. C4-C5: No significant disc herniation. No spinal canal stenosis or neural foraminal narrowing. C5-C6: No significant disc herniation. No spinal canal stenosis or neural foraminal narrowing. C6-C7: No significant disc herniation. No spinal canal stenosis or neural foraminal narrowing. C7-T1: No significant disc herniation. No spinal canal stenosis or neural foraminal narrowing. IMPRESSION: 1. Abnormal increased T2 signal within the spinal cord extending from the cervical medullary junction to the mid body of C6, with abnormal enhancement posterolaterally on the right at the cervical medullary junction and posterolaterally on the left at the C1 and C2 vertebral body levels, consistent with demyelinating disease. Infectious, inflammatory, and ischemic myelitis  are considered less likely. Electronically signed by: Evalene Coho MD 12/06/2023 07:23 AM EDT RP Workstation: [id number]   MR Brain W and Wo Contrast Result Date: 12/05/2023 EXAM: MRI  BRAIN WITH AND WITHOUT CONTRAST 12/05/2023 05:56:58 AM TECHNIQUE: Multiplanar multisequence MRI of the head/brain was performed with and without the administration of intravenous contrast. COMPARISON: None available. CLINICAL HISTORY: Trigeminal neuralgia suspected, check for mass lesions evidence of multiple sclerosis. 7ml gadavist . Arrived via [facility] with CC of headache and dizziness. Per EMS, the headache has been ongoing for several months, but tonight the pain is 10/10. EMS reports that pain is described as stabbing and burning pain and is on the right side of the head. Pt states that it hurts to open right eye. FINDINGS: BRAIN AND VENTRICLES: No acute infarct. No acute intracranial hemorrhage. No mass effect or midline shift. No hydrocephalus. The sella is unremarkable. Normal flow voids. There is mild subcortical white matter disease present bilaterally. ORBITS: The optic nerves appear normal in morphology and signal intensity and demonstrate no abnormal enhancement. SINUSES: No acute abnormality. BONES AND SOFT TISSUES: Normal bone marrow signal and enhancement. No acute soft tissue abnormality. There is increased T2 signal present throughout the visualized spinal cord at the cervical medullary junction. There is also abnormal enhancement along the right lateral and left posterolateral surfaces of the upper cervical spinal cord. Follow up MRI of the cervical, thoracic and lumbar spine is recommended without and with gadolinium contrast. IMPRESSION: 1. Abnormal T2 signal throughout the visualized cervical cord and abnormal enhancement along the right lateral and left posterolateral surfaces of the upper cervical spinal cord. The findings are concerning for demyelinating disease, but neoplasm cannot be excluded. Recommend correlation with dedicated MRI of the cervical, thoracic, and lumbar spine without and with gadolinium contrast 2. The above findings were discussed with Dr. Cyrena at 6:13 am 12/05/2023  Electronically signed by: Evalene Coho MD 12/05/2023 06:23 AM EDT RP Workstation: [id number]      Labs: BNP (last 3 results) No results for input(s): BNP in the last 8760 hours. Basic Metabolic Panel: Recent Labs  Lab 12/05/23 0240  NA 142  K 3.8  CL 101  CO2 26  GLUCOSE 111*  BUN 18  CREATININE 0.91  CALCIUM  9.0   Liver Function Tests: No results for input(s): AST, ALT, ALKPHOS, BILITOT, PROT, ALBUMIN  in the last 168 hours. No results for input(s): LIPASE, AMYLASE in the last 168 hours. No results for input(s): AMMONIA in the last 168 hours. CBC: Recent Labs  Lab 12/05/23 0240  WBC 9.4  NEUTROABS 5.5  HGB 13.3  HCT 39.2  MCV 89.7  PLT 271   Cardiac Enzymes: No results for input(s): CKTOTAL, CKMB, CKMBINDEX, TROPONINI in the last 168 hours. BNP: Invalid input(s): POCBNP CBG: No results for input(s): GLUCAP in the last 168 hours. D-Dimer No results for input(s): DDIMER in the last 72 hours. Hgb A1c No results for input(s): HGBA1C in the last 72 hours. Lipid Profile No results for input(s): CHOL, HDL, LDLCALC, TRIG, CHOLHDL, LDLDIRECT in the last 72 hours. Thyroid function studies No results for input(s): TSH, T4TOTAL, T3FREE, THYROIDAB in the last 72 hours.  Invalid input(s): FREET3 Anemia work up No results for input(s): VITAMINB12, FOLATE, FERRITIN, TIBC, IRON, RETICCTPCT in the last 72 hours. Urinalysis    Component Value Date/Time   COLORURINE YELLOW (A) 09/30/2018 1525   APPEARANCEUR HAZY (A) 09/30/2018 1525   LABSPEC 1.023 09/30/2018 1525   PHURINE 5.0 09/30/2018 1525   GLUCOSEU NEGATIVE 09/30/2018  1525   HGBUR SMALL (A) 09/30/2018 1525   BILIRUBINUR NEGATIVE 09/30/2018 1525   KETONESUR 5 (A) 09/30/2018 1525   PROTEINUR NEGATIVE 09/30/2018 1525   NITRITE NEGATIVE 09/30/2018 1525   LEUKOCYTESUR NEGATIVE 09/30/2018 1525   Sepsis Labs Recent Labs  Lab 12/05/23 0240  WBC  9.4   Microbiology No results found for this or any previous visit (from the past 240 hours).   Total time spend on discharging this patient, including the last patient exam, discussing the hospital stay, instructions for ongoing care as it relates to all pertinent caregivers, as well as preparing the medical discharge records, prescriptions, and/or referrals as applicable, is 60 minutes.    Ellouise Haber, MD  [facility] 12/06/2023, 8:56 PM

## 2023-12-06 NOTE — Progress Notes (Signed)
 NEUROLOGY CONSULT FOLLOW UP NOTE   Date of service: December 06, 2023 Patient Name: Patricia Patton MRN:  [id number] DOB:  February 24, 1971  Interval Hx/subjective   Trigeminal neuralgia attacks have lessened in frequency since starting steroids. Bilateral shoulder pain has decreased as well and without pain there she is full strength in BUE throughout.  Vitals   Vitals:   12/05/23 1821 12/05/23 2020 12/06/23 0413 12/06/23 0753  BP: 130/82 (!) 140/98 (!) 154/96 (!) 141/94  Pulse: 90 94 (!) 101 98  Resp: 18 16 16 16   Temp: 97.6 F (36.4 C) 98.1 F (36.7 C) 97.8 F (36.6 C) 98.2 F (36.8 C)  TempSrc: Oral   Oral  SpO2: 96% 96% 96% 99%  Weight:      Height:         Body mass index is 26.34 kg/m.  Physical Exam   Gen: patient lying in bed, alert today CV: extremities appear well-perfused Resp: normal WOB   Neurologic exam MS: alert today, oriented x4, follows commands Speech: no dysarthria, no aphasia CN: PERRL, VFF, EOMI, sensation intact, face symmetric, hearing intact to voice Motor: 5/5 strength throughout  Sensory: stocking glove sensory deficit to PP and vibration in all extremities Reflexes: 3+ throughout with sustained clonus at the bilateral achilles Coordination: FNF intact bilat Gait: deferred  Medications  Current Facility-Administered Medications:    acetaminophen  (TYLENOL ) tablet 650 mg, 650 mg, Oral, Q6H PRN **OR** acetaminophen  (TYLENOL ) suppository 650 mg, 650 mg, Rectal, Q6H PRN, Laurita Cort DASEN, MD   carbamazepine  (TEGRETOL ) tablet 200 mg, 200 mg, Oral, TID, Matthews Elida HERO, MD, 200 mg at 12/06/23 9051   gabapentin  (NEURONTIN ) capsule 900 mg, 900 mg, Oral, TID, Matthews Elida HERO, MD   ipratropium-albuterol  (DUONEB) 0.5-2.5 (3) MG/3ML nebulizer solution 3 mL, 3 mL, Nebulization, Q6H PRN, Laurita Cort T, MD   LORazepam  (ATIVAN ) injection 0.5 mg, 0.5 mg, Intravenous, Q6H PRN, Laurita Cort T, MD   LORazepam  (ATIVAN ) tablet 1 mg, 1 mg, Oral, Q6H PRN,  Laurita Cort T, MD, 1 mg at 12/06/23 0948   methylPREDNISolone  sodium succinate (SOLU-MEDROL ) 1,000 mg in sodium chloride  0.9 % 50 mL IVPB, 1,000 mg, Intravenous, Q24H, Last Rate: 66 mL/hr at 12/06/23 1006, 1,000 mg at 12/06/23 1006 **AND** pantoprazole  (PROTONIX ) injection 40 mg, 40 mg, Intravenous, Q24H, Matthews Elida HERO, MD, 40 mg at 12/05/23 1015   ondansetron  (ZOFRAN ) tablet 4 mg, 4 mg, Oral, Q6H PRN **OR** ondansetron  (ZOFRAN ) injection 4 mg, 4 mg, Intravenous, Q6H PRN, Laurita Cort T, MD   oxyCODONE  (Oxy IR/ROXICODONE ) immediate release tablet 5 mg, 5 mg, Oral, Q6H PRN, Laurita Cort T, MD, 5 mg at 12/06/23 0336  Labs and Diagnostic Imaging   CBC:  Recent Labs  Lab 12/05/23 0240  WBC 9.4  NEUTROABS 5.5  HGB 13.3  HCT 39.2  MCV 89.7  PLT 271    Basic Metabolic Panel:  Lab Results  Component Value Date   NA 142 12/05/2023   K 3.8 12/05/2023   CO2 26 12/05/2023   GLUCOSE 111 (H) 12/05/2023   BUN 18 12/05/2023   CREATININE 0.91 12/05/2023   CALCIUM  9.0 12/05/2023   GFRNONAA >60 12/05/2023   GFRAA >60 09/30/2018   Lipid Panel: No results found for: LDLCALC HgbA1c: No results found for: HGBA1C Urine Drug Screen: No results found for: LABOPIA, COCAINSCRNUR, LABBENZ, AMPHETMU, THCU, LABBARB  Alcohol Level No results found for: ETH INR No results found for: INR APTT No results found for: APTT AED levels: No results found  for: PHENYTOIN, ZONISAMIDE, LAMOTRIGINE , LEVETIRACETA   MRI Brain wwo (Personally reviewed): Abnormal T2 signal throughout the visualized cervical cord and abnormal enhancement along the right lateral and left posterolateral surfaces of the upper cervical spinal cord. The findings are concerning for demyelinating disease, but neoplasm cannot be excluded. Recommend correlation with dedicated MRI of the cervical, thoracic, and lumbar spine without and with gadolinium Contrast  MRI c spine wwo (personally reviewed): 1. Abnormal  increased T2 signal within the spinal cord extending from the cervical medullary junction to the mid body of C6, with abnormal enhancement posterolaterally on the right at the cervical medullary junction and posterolaterally on the left at the C1 and C2 vertebral body levels, consistent with demyelinating disease. Infectious, inflammatory, and ischemic myelitis are considered less likely.   MRI T spine wwo (personally reviewed): Normal  MRI L spine wwo (personally reviewed): Normal  MRI orbits wwo (personally reviewed): No evidence of acute or prior optic neuritis.   Assessment   Patricia Patton is a 53 y.o. female with onset of R sided trigeminal neuralgia 2 months ago increasing in frequency and severity and now including V1 on L side, neck pain radiating to her bilateral shoulders who presented to ED for intractable facial pain. MRI brain was performed which showed no significant intracranial abnormalities but did show abnormal increased T2 flair signal within the spinal cord extending from the cervical medullary junction to the mid body of C6 with abnormal enhancement posterolaterally on the right at the cervical medullary junction and posterior laterally on the left at the C1 and C2 vertebral body levels consistent with active demyelination.   Today she underwent further MRI wwo imaging of the rest of the neuroaxis incl orbits, C + T spine. The cervical spine lesion incompletely characterized on brain MRI is longitudinally-extensive from the cervicomedullary junction to C6 with enhancement from the CMJ to C2. Given how extensive the lesion is, it is surprising that she has no neurologic deficits other than trigeminal neuralgia (occurring 2/2 extension of the trigeminal nucleus at the CMJ) I am concerned about the possibility of her developing weakness or other neurologic deficits if we are not maximally aggressive in treating this acutely. At this point I would recommend  continuation of high-dose steroids and also transfer to [facility] for concurrent PLEX.   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 Confirmatory autoantibody testing was sent 12/05/23  Recommendations   - Transfer to [facility] for concurrent PLEX. Plan for 2-3 consecutive days f/b every other day for total 5 sessions - Solumedrol 1g q 24 hrs x5 days end date 9/27 - Continue carbamazepine  to 200mg  tid - Continue gabapentin  900mg  tid - D/c lamotrigine , patient is no longer taking - OK to use prn narcotics until pain is better-controlled - [facility] TEST ID #[id number] CNS Demyelinating Disease Evaluation Profile, Serum (incl anti-MOG and aquaporin 4) - ordered  D/w patient and her husband Dene who are amenable to plan. D/w Dr. Awanda who will put in the transfer request. I will also call Dr. Sal Khaliqdina the overnight neurohospitalist at [facility]. Please notify [facility] neurohospitalist upon patient arrival so we can coordinate PLEX immediately.  ______________________________________________________________________   Signed, Elida CHRISTELLA Ross, MD [facility]

## 2023-12-06 NOTE — Plan of Care (Signed)
 Pt just picked up by [facility] to tsfer to [facility] for additional care. She is going to 3W-26. Report given by Selinda, RN.

## 2023-12-06 NOTE — Progress Notes (Signed)
NIF -40 cmH20

## 2023-12-06 NOTE — Plan of Care (Signed)

## 2023-12-06 NOTE — Progress Notes (Signed)
Nif -40  

## 2023-12-07 ENCOUNTER — Inpatient Hospital Stay (HOSPITAL_COMMUNITY)

## 2023-12-07 DIAGNOSIS — G360 Neuromyelitis optica [Devic]: Principal | ICD-10-CM

## 2023-12-07 DIAGNOSIS — F32A Depression, unspecified: Secondary | ICD-10-CM

## 2023-12-07 HISTORY — PX: IR TUNNELED CENTRAL VENOUS CATH PLC W IMG: IMG1939

## 2023-12-07 LAB — COMPREHENSIVE METABOLIC PANEL WITH GFR
ALT: 27 U/L (ref 0–44)
AST: 18 U/L (ref 15–41)
Albumin: 3.6 g/dL (ref 3.5–5.0)
Alkaline Phosphatase: 50 U/L (ref 38–126)
Anion gap: 9 (ref 5–15)
BUN: 16 mg/dL (ref 6–20)
CO2: 26 mmol/L (ref 22–32)
Calcium: 9.1 mg/dL (ref 8.9–10.3)
Chloride: 106 mmol/L (ref 98–111)
Creatinine, Ser: 0.74 mg/dL (ref 0.44–1.00)
GFR, Estimated: >60 mL/min (ref >60)
Glucose, Bld: 123 mg/dL — ABNORMAL HIGH (ref 70–99)
Potassium: 3.9 mmol/L (ref 3.5–5.1)
Sodium: 141 mmol/L (ref 135–145)
Total Bilirubin: 0.5 mg/dL (ref 0.0–1.2)
Total Protein: 6.4 g/dL — ABNORMAL LOW (ref 6.5–8.1)

## 2023-12-07 LAB — CBC
HCT: 36.1 % (ref 36.0–46.0)
Hemoglobin: 12.5 g/dL (ref 12.0–15.0)
MCH: 30.5 pg (ref 26.0–34.0)
MCHC: 34.6 g/dL (ref 30.0–36.0)
MCV: 88 fL (ref 80.0–100.0)
Platelets: 272 K/uL (ref 150–400)
RBC: 4.1 MIL/uL (ref 3.87–5.11)
RDW: 11.7 % (ref 11.5–15.5)
WBC: 12.5 K/uL — ABNORMAL HIGH (ref 4.0–10.5)
nRBC: 0 % (ref 0.0–0.2)

## 2023-12-07 LAB — PROTIME-INR
INR: 1 (ref 0.8–1.2)
Prothrombin Time: 13.8 s (ref 11.4–15.2)

## 2023-12-07 LAB — APTT: aPTT: 26 s (ref 24–36)

## 2023-12-07 MED ORDER — DIPHENHYDRAMINE HCL 25 MG PO CAPS: 50.0000 mg | ORAL_CAPSULE | Freq: Once | ORAL | Status: DC

## 2023-12-07 MED ORDER — PANTOPRAZOLE SODIUM 40 MG IV SOLR
40.0000 mg | INTRAVENOUS | Status: AC
  Administered 2023-12-07 – 2023-12-09 (×3): 40 mg via INTRAVENOUS
  Filled 2023-12-07 (×3): qty 10

## 2023-12-07 MED ORDER — SODIUM CHLORIDE 0.9 % IV SOLN
INTRAVENOUS | Status: AC
  Filled 2023-12-07 (×3): qty 200

## 2023-12-07 MED ORDER — HEPARIN SODIUM (PORCINE) 1000 UNIT/ML IJ SOLN
INTRAMUSCULAR | Status: AC
  Filled 2023-12-07: qty 10

## 2023-12-07 MED ORDER — DIPHENHYDRAMINE HCL 50 MG/ML IJ SOLN
50.0000 mg | INTRAMUSCULAR | Status: AC
  Administered 2023-12-07: 50 mg via INTRAVENOUS
  Filled 2023-12-07: qty 1

## 2023-12-07 MED ORDER — ACETAMINOPHEN 325 MG PO TABS
650.0000 mg | ORAL_TABLET | ORAL | Status: DC | PRN
  Administered 2023-12-07 (×2): 650 mg via ORAL
  Filled 2023-12-07: qty 2

## 2023-12-07 MED ORDER — NALOXONE HCL 0.4 MG/ML IJ SOLN: 0.4000 mg | INTRAMUSCULAR | Status: DC | PRN

## 2023-12-07 MED ORDER — LIDOCAINE-EPINEPHRINE 1 %-1:100000 IJ SOLN
20.0000 mL | Freq: Once | INTRAMUSCULAR | Status: AC
  Administered 2023-12-07: 10 mL via INTRADERMAL

## 2023-12-07 MED ORDER — HEPARIN SODIUM (PORCINE) 1000 UNIT/ML IJ SOLN
10000.0000 [IU] | Freq: Once | INTRAMUSCULAR | Status: AC
  Administered 2023-12-07: 2400 [IU]

## 2023-12-07 MED ORDER — CARBAMAZEPINE 100 MG PO CHEW
200.0000 mg | CHEWABLE_TABLET | Freq: Three times a day (TID) | ORAL | Status: DC
Start: 2023-12-07 — End: 2023-12-10
  Administered 2023-12-07 – 2023-12-10 (×10): 200 mg via ORAL
  Filled 2023-12-07 (×12): qty 2

## 2023-12-07 MED ORDER — HEPARIN SODIUM (PORCINE) 1000 UNIT/ML IJ SOLN
INTRAMUSCULAR | Status: AC
  Filled 2023-12-07: qty 3

## 2023-12-07 MED ORDER — CALCIUM CARBONATE ANTACID 500 MG PO CHEW
CHEWABLE_TABLET | ORAL | Status: AC
  Filled 2023-12-07: qty 2

## 2023-12-07 MED ORDER — GABAPENTIN 300 MG PO CAPS
900.0000 mg | ORAL_CAPSULE | Freq: Once | ORAL | Status: AC
  Administered 2023-12-07: 900 mg via ORAL
  Filled 2023-12-07: qty 3

## 2023-12-07 MED ORDER — CALCIUM CARBONATE ANTACID 500 MG PO CHEW
CHEWABLE_TABLET | ORAL | Status: AC
Start: 2023-12-07 — End: 2023-12-07
  Filled 2023-12-07: qty 2

## 2023-12-07 MED ORDER — ACETAMINOPHEN 325 MG PO TABS: 650.0000 mg | ORAL_TABLET | Freq: Four times a day (QID) | ORAL | Status: DC | PRN

## 2023-12-07 MED ORDER — ACD FORMULA A 0.73-2.45-2.2 GM/100ML VI SOLN
1000.0000 mL | Status: DC
  Administered 2023-12-07: 1000 mL
  Filled 2023-12-07 (×2): qty 1000

## 2023-12-07 MED ORDER — SODIUM CHLORIDE 0.9% FLUSH
10.0000 mL | Freq: Two times a day (BID) | INTRAVENOUS | Status: DC
  Administered 2023-12-07 – 2023-12-11 (×9): 10 mL

## 2023-12-07 MED ORDER — CALCIUM CARBONATE ANTACID 500 MG PO CHEW
2.0000 | CHEWABLE_TABLET | ORAL | Status: AC
  Administered 2023-12-07 (×2): 400 mg via ORAL
  Filled 2023-12-07: qty 2

## 2023-12-07 MED ORDER — CALCIUM GLUCONATE-NACL 2-0.675 GM/100ML-% IV SOLN
2.0000 g | Freq: Once | INTRAVENOUS | Status: AC
  Administered 2023-12-07: 2000 mg via INTRAVENOUS
  Filled 2023-12-07: qty 100

## 2023-12-07 MED ORDER — HEPARIN SODIUM (PORCINE) 1000 UNIT/ML IJ SOLN
1000.0000 [IU] | Freq: Once | INTRAMUSCULAR | Status: AC
  Administered 2023-12-07: 1000 [IU]
  Filled 2023-12-07: qty 1

## 2023-12-07 MED ORDER — SODIUM CHLORIDE 0.9% FLUSH: 10.0000 mL | INTRAVENOUS | Status: DC | PRN

## 2023-12-07 MED ORDER — ACETAMINOPHEN 325 MG PO TABS
ORAL_TABLET | ORAL | Status: AC
  Filled 2023-12-07: qty 2

## 2023-12-07 MED ORDER — OXYCODONE HCL 5 MG PO TABS
5.0000 mg | ORAL_TABLET | Freq: Four times a day (QID) | ORAL | Status: DC | PRN
Start: 2023-12-07 — End: 2023-12-11
  Administered 2023-12-07 – 2023-12-11 (×8): 5 mg via ORAL
  Filled 2023-12-07 (×8): qty 1

## 2023-12-07 MED ORDER — CHLORHEXIDINE GLUCONATE CLOTH 2 % EX PADS
6.0000 | MEDICATED_PAD | Freq: Every day | CUTANEOUS | Status: AC
Start: 2023-12-07 — End: ?
  Administered 2023-12-07 – 2023-12-11 (×5): 6 via TOPICAL

## 2023-12-07 MED ORDER — LIDOCAINE-EPINEPHRINE 1 %-1:100000 IJ SOLN
INTRAMUSCULAR | Status: AC
  Filled 2023-12-07: qty 1

## 2023-12-07 MED ORDER — ACETAMINOPHEN 650 MG RE SUPP: 650.0000 mg | Freq: Four times a day (QID) | RECTAL | Status: DC | PRN

## 2023-12-07 MED ORDER — CALCIUM CARBONATE ANTACID 500 MG PO CHEW: 2.0000 | CHEWABLE_TABLET | ORAL | Status: DC

## 2023-12-07 MED ORDER — GABAPENTIN 300 MG PO CAPS
900.0000 mg | ORAL_CAPSULE | Freq: Three times a day (TID) | ORAL | Status: DC
Start: 2023-12-07 — End: 2023-12-12
  Administered 2023-12-07 – 2023-12-12 (×16): 900 mg via ORAL
  Filled 2023-12-07 (×16): qty 3

## 2023-12-07 MED ORDER — DIPHENHYDRAMINE HCL 50 MG/ML IJ SOLN
INTRAMUSCULAR | Status: AC
  Filled 2023-12-07: qty 1

## 2023-12-07 MED ORDER — DULOXETINE HCL 60 MG PO CPEP
60.0000 mg | ORAL_CAPSULE | Freq: Every day | ORAL | Status: DC
  Administered 2023-12-07 – 2023-12-12 (×6): 60 mg via ORAL
  Filled 2023-12-07 (×6): qty 1

## 2023-12-07 MED ORDER — SODIUM CHLORIDE 0.9 % IV SOLN
1000.0000 mg | INTRAVENOUS | Status: AC
  Administered 2023-12-07 – 2023-12-09 (×3): 1000 mg via INTRAVENOUS
  Filled 2023-12-07 (×3): qty 16

## 2023-12-07 MED ORDER — TRAZODONE HCL 50 MG PO TABS
150.0000 mg | ORAL_TABLET | Freq: Every day | ORAL | Status: DC
  Administered 2023-12-07 – 2023-12-10 (×4): 150 mg via ORAL
  Filled 2023-12-07 (×4): qty 1

## 2023-12-07 MED ORDER — DIPHENHYDRAMINE HCL 25 MG PO CAPS
ORAL_CAPSULE | ORAL | Status: AC
  Filled 2023-12-07: qty 1

## 2023-12-07 MED ORDER — OXYCODONE HCL 5 MG PO TABS
5.0000 mg | ORAL_TABLET | Freq: Once | ORAL | Status: AC
  Administered 2023-12-07: 5 mg via ORAL
  Filled 2023-12-07: qty 1

## 2023-12-07 MED ORDER — CARBAMAZEPINE 200 MG PO TABS
200.0000 mg | ORAL_TABLET | Freq: Once | ORAL | Status: AC
  Administered 2023-12-07: 200 mg via ORAL
  Filled 2023-12-07: qty 1

## 2023-12-07 MED ORDER — ACD FORMULA A 0.73-2.45-2.2 GM/100ML VI SOLN: 1000.0000 mL | Status: DC

## 2023-12-07 MED ORDER — ALBUMIN HUMAN 25 % IV SOLN
Freq: Once | INTRAVENOUS | Status: DC
  Filled 2023-12-07: qty 200

## 2023-12-07 MED ORDER — DIPHENHYDRAMINE HCL 25 MG PO CAPS
25.0000 mg | ORAL_CAPSULE | Freq: Four times a day (QID) | ORAL | Status: DC | PRN
  Administered 2023-12-07 (×2): 25 mg via ORAL
  Filled 2023-12-07: qty 1

## 2023-12-07 MED ORDER — DIPHENHYDRAMINE HCL 25 MG PO CAPS: 25.0000 mg | ORAL_CAPSULE | Freq: Four times a day (QID) | ORAL | Status: DC | PRN

## 2023-12-07 NOTE — H&P (Signed)
 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 female with medical history significant of anxiety/depression, onset of right-sided trigeminal neuralgia 2 months ago increasing in frequency and severity and now including V1 on left side, neck pain radiating to her bilateral shoulders presented to [facility] 2 days ago for intractable headache/left-sided facial pain and bilateral shoulder pain.  MRI was performed which showed no significant intracranial abnormalities but did show abnormal T2 signal throughout the visualized cervical cord and abnormal enhancement along the right lateral and left posterior lateral surfaces of the upper cervical spinal cord most concerning for active demyelination.  MRI of C-spine showing abnormal increased T2 signal within the spinal cord extending from the cervical medullary junction to the mid body of C6 with abnormal enhancement posterolaterally on the right at the cervical medullary junction and posterolaterally on the left at the C1 and C2 vertebral body levels consistent with demyelinating disease.  MRI of orbits, thoracic spine, and lumbar spine negative for acute findings.  Neurology consulted and are suspecting neuromyelitis optica spectrum disorder.  Confirmatory autoantibody testing was sent on 9/23.  Patient was started on high-dose steroids and transferred to [facility] tonight for concurrent PLEX for 2-3 consecutive days followed by every other day for a total of 5 sessions.  Neurology recommended continuing Solu-Medrol  1 g every 24 hours x 5 days (end date 9/27), carbamazepine  200 mg 3 times daily, gabapentin  900 mg 3 times daily, and PRN narcotics until pain is better controlled.  Patient reports having right-sided trigeminal neuralgia pain for the  past 2 months which has gotten progressively worse and recently started having pain on the left side of her face in V1 distribution.  She describes it as sharp burning pain which radiates to her neck and bilateral shoulders.  No other complaints.  Review of Systems:  Review of Systems  All other systems reviewed and are negative.   Past Medical History:  Diagnosis Date   Anxiety    Bell's palsy    Strep throat 2018    Past Surgical History:  Procedure Laterality Date   CESAREAN SECTION     FOOT SURGERY Bilateral    TONSILLECTOMY       reports that she has quit smoking. She has never used smokeless tobacco. She reports current alcohol use. She reports current drug use. Drug: Marijuana.  Allergies  Allergen Reactions   Amoxicillin-Pot Clavulanate Hives and Swelling    Specifically Augmentin     Family History  Problem Relation Age of Onset   Diabetes Mother    Diabetes Father     Prior to Admission medications   Medication Sig Start Date End Date Taking? Authorizing Provider  carbamazepine  (TEGRETOL ) 100 MG chewable tablet Chew 100 mg by mouth 3 (three) times daily. 11/28/23 12/28/23  [provider]  DULoxetine  (CYMBALTA ) 60 MG capsule Take 60 mg by mouth daily. 11/08/23 11/07/24  [provider]  gabapentin  (NEURONTIN ) 300 MG capsule Take 300 mg by mouth 3 (three) times daily. 05/25/23   [provider]  methylPREDNISolone  sodium succinate 1,000 mg in sodium chloride  0.9 % 50 mL Inject 1,000 mg into the vein daily. 12/07/23   Awanda City, MD  oxyCODONE  (OXY IR/ROXICODONE ) 5 MG immediate release tablet Take 5 mg by mouth every 6 (six) hours as needed. Patient  not taking: Reported on 12/05/2023 10/13/23   [provider]  pantoprazole  (PROTONIX ) 40 MG injection Inject 40 mg into the vein daily. 12/07/23   Awanda City, MD  traZODone  (DESYREL ) 150 MG tablet Take 150 mg by mouth at bedtime. 11/08/23 11/07/24  [provider]    Physical  Exam: Vitals:   12/07/23 0000  BP: (!) 146/101  Pulse: 93  Resp: 16  Temp: 98.2 F (36.8 C)  TempSrc: Oral  SpO2: 97%    Physical Exam Vitals reviewed.  Constitutional:      General: She is not in acute distress. HENT:     Head: Normocephalic and atraumatic.  Eyes:     Extraocular Movements: Extraocular movements intact.  Cardiovascular:     Rate and Rhythm: Normal rate and regular rhythm.     Heart sounds: Normal heart sounds.  Pulmonary:     Effort: Pulmonary effort is normal. No respiratory distress.     Breath sounds: Normal breath sounds.  Abdominal:     General: Bowel sounds are normal. There is no distension.     Palpations: Abdomen is soft.     Tenderness: There is no abdominal tenderness. There is no guarding.  Musculoskeletal:     Cervical back: Normal range of motion.     Right lower leg: No edema.     Left lower leg: No edema.  Skin:    General: Skin is warm and dry.  Neurological:     General: No focal deficit present.     Mental Status: She is alert and oriented to person, place, and time.     Labs on Admission: I have personally reviewed following labs and imaging studies  CBC: Recent Labs  Lab 12/05/23 0240  WBC 9.4  NEUTROABS 5.5  HGB 13.3  HCT 39.2  MCV 89.7  PLT 271   Basic Metabolic Panel: Recent Labs  Lab 12/05/23 0240  NA 142  K 3.8  CL 101  CO2 26  GLUCOSE 111*  BUN 18  CREATININE 0.91  CALCIUM  9.0   GFR: Estimated Creatinine Clearance: 64.2 mL/min (by C-G formula based on SCr of 0.91 mg/dL). Liver Function Tests: No results for input(s): AST, ALT, ALKPHOS, BILITOT, PROT, ALBUMIN  in the last 168 hours. No results for input(s): LIPASE, AMYLASE in the last 168 hours. No results for input(s): AMMONIA in the last 168 hours. Coagulation Profile: No results for input(s): INR, PROTIME in the last 168 hours. Cardiac Enzymes: No results for input(s): CKTOTAL, CKMB, CKMBINDEX, TROPONINI in the last  168 hours. BNP (last 3 results) No results for input(s): PROBNP in the last 8760 hours. HbA1C: No results for input(s): HGBA1C in the last 72 hours. CBG: No results for input(s): GLUCAP in the last 168 hours. Lipid Profile: No results for input(s): CHOL, HDL, LDLCALC, TRIG, CHOLHDL, LDLDIRECT in the last 72 hours. Thyroid Function Tests: No results for input(s): TSH, T4TOTAL, FREET4, T3FREE, THYROIDAB in the last 72 hours. Anemia Panel: No results for input(s): VITAMINB12, FOLATE, FERRITIN, TIBC, IRON, RETICCTPCT in the last 72 hours. Urine analysis:    Component Value Date/Time   COLORURINE YELLOW (A) 09/30/2018 1525   APPEARANCEUR HAZY (A) 09/30/2018 1525   LABSPEC 1.023 09/30/2018 1525   PHURINE 5.0 09/30/2018 1525   GLUCOSEU NEGATIVE 09/30/2018 1525   HGBUR SMALL (A) 09/30/2018 1525   BILIRUBINUR NEGATIVE 09/30/2018 1525   KETONESUR 5 (A) 09/30/2018 1525   PROTEINUR NEGATIVE 09/30/2018 1525   NITRITE NEGATIVE 09/30/2018 1525   LEUKOCYTESUR NEGATIVE 09/30/2018  1525    Radiological Exams on Admission: MR ORBITS W WO CONTRAST Result Date: 12/06/2023 EXAM: MRI ORBITS WITHOUT AND WITH CONTRAST 12/06/2023 06:55:38 AM TECHNIQUE: Multiplanar, multisequence MRI of the orbits was performed without and with intravenous contrast. 7 mL gadobutrol  (GADAVIST ) 1 MMOL/ML injection 7 mL GADOBUTROL  1 MMOL/ML IV SOLN. COMPARISON: MRI of the head dated 12/05/2023. CLINICAL HISTORY: Acute onset trigeminal neuralgia bilaterally with findings c/f NMO in c spine. Eval for e/o acute or prior optic neuritis. 7ml gadavist . Worsening of headaches, facial pain and shoulder pain. Abnormal Brain MRI 12/05/2023. FINDINGS: ORBITS: Normal globes. Lenses are normally located. The optic nerves are normal in morphology and signal intensity. The optic chiasm is unremarkable. Symmetric extraocular muscles. No orbital mass. No abnormal enhancement. No appreciable abnormal cranial  nerve enhancement. SINUSES AND MASTOIDS: Clear. BRAIN: There are a few scattered foci of increased T2 signal present within the subcortical white matter bilaterally. There is T2 hyperintensity again demonstrated within the visualized cervical spinal cord and cervical medullary junction. There is persistent abnormal enhancement of the spinal cord on the right at the cervical medullary junction and on the left at the C1 and C2 vertebral body levels. BONES AND SOFT TISSUES: Normal bone marrow signal. No acute soft tissue abnormality. IMPRESSION: 1. No evidence of acute or prior optic neuritis. 2. Persistent abnormal enhancement of the spinal cord on the right at the cervical medullary junction and on the left at the C1 and C2 vertebral body levels, consistent with demyelinating disease. Electronically signed by: Evalene Coho MD 12/06/2023 07:36 AM EDT RP Workstation: [id number]   MR THORACIC SPINE W WO CONTRAST Result Date: 12/06/2023 EXAM: MRI THORACIC SPINE WITHOUT AND WITH INTRAVENOUS CONTRAST 12/06/2023 06:55:38 AM TECHNIQUE: Multiplanar multisequence MRI of the thoracic spine was performed without and with the administration of intravenous contrast. 7 mL gadobutrol  (GADAVIST ) 1 MMOL/ML injection 7 mL GADOBUTROL  1 MMOL/ML IV SOLN was administered. COMPARISON: None available. CLINICAL HISTORY: Multiple sclerosis (MS). 7ml gadavist . Worsening of headaches, facial pain and shoulder pain. Abnormal Brain MRI 12/05/2023. FINDINGS: BONES AND ALIGNMENT: Normal alignment. Normal vertebral body heights. Bone marrow signal is unremarkable. No abnormal enhancement. SPINAL CORD: The spinal cord is normal in morphology and signal intensity. There is no abnormal enhancement. There is no evidence of demyelinating disease involving the thoracic spinal cord. SOFT TISSUES: Unremarkable. DEGENERATIVE CHANGES: No significant disc herniation. No spinal canal stenosis or neural foraminal narrowing. IMPRESSION: 1. No evidence of  demyelinating disease involving the thoracic spinal cord. Electronically signed by: Evalene Coho MD 12/06/2023 07:26 AM EDT RP Workstation: [id number]   MR Lumbar Spine W Tommye Contrast Result Date: 12/06/2023 EXAM: MRI LUMBAR SPINE 12/06/2023 06:55:38 AM TECHNIQUE: Multiplanar multisequence MRI of the lumbar spine was performed with and without the administration of 7mL gadobutrol  (GADAVIST ) 1 MMOL/ML. COMPARISON: None available. CLINICAL HISTORY: Multiple sclerosis (MS). 7ml gadavist . Worsening of headaches, facial pain and shoulder pain. Abnormal Brain MRI 12/05/2023. FINDINGS: BONES AND ALIGNMENT: Normal alignment. Normal vertebral body heights. Bone marrow signal is unremarkable. SPINAL CORD: The conus terminates normally. SOFT TISSUES: No paraspinal mass. L1-L2: No significant disc herniation. No spinal canal stenosis or neural foraminal narrowing. L2-L3: No significant disc herniation. No spinal canal stenosis or neural foraminal narrowing. L3-L4: No significant disc herniation. No spinal canal stenosis or neural foraminal narrowing. L4-L5: No significant disc herniation. No spinal canal stenosis or neural foraminal narrowing. L5-S1: No significant disc herniation. No spinal canal stenosis or neural foraminal narrowing. IMPRESSION: 1. No spinal canal stenosis or neural  foraminal narrowing in the lumbar spine. Electronically signed by: Evalene Coho MD 12/06/2023 07:24 AM EDT RP Workstation: [id number]   MR CERVICAL SPINE W WO CONTRAST Result Date: 12/06/2023 EXAM: MRI CERVICAL SPINE WITH AND WITHOUT CONTRAST 12/06/2023 06:55:38 AM TECHNIQUE: Multiplanar multisequence MRI of the cervical spine was performed without and with the administration of intravenous contrast. COMPARISON: None available. CLINICAL HISTORY: Multiple sclerosis (MS). 7ml gadavist . Worsening of headaches, facial pain and shoulder pain. Abnormal Brain MRI 12/05/2023. FINDINGS: BONES AND ALIGNMENT: Normal alignment. Normal vertebral  body heights. Marrow signal is unremarkable. No abnormal enhancement. SPINAL CORD: There is abnormal increased T2 signal present within the spinal cord extending from the cervical medullary junction to the mid body of C6. There is abnormal enhancement present posterolaterally on the right within the cord at the cervical medullary junction and posterolaterally on the left at the C1 and C2 vertebral body levels. The cord does not appear expanded. SOFT TISSUES: No paraspinal mass. C2-C3: No significant disc herniation. No spinal canal stenosis or neural foraminal narrowing. C3-C4: No significant disc herniation. No spinal canal stenosis or neural foraminal narrowing. C4-C5: No significant disc herniation. No spinal canal stenosis or neural foraminal narrowing. C5-C6: No significant disc herniation. No spinal canal stenosis or neural foraminal narrowing. C6-C7: No significant disc herniation. No spinal canal stenosis or neural foraminal narrowing. C7-T1: No significant disc herniation. No spinal canal stenosis or neural foraminal narrowing. IMPRESSION: 1. Abnormal increased T2 signal within the spinal cord extending from the cervical medullary junction to the mid body of C6, with abnormal enhancement posterolaterally on the right at the cervical medullary junction and posterolaterally on the left at the C1 and C2 vertebral body levels, consistent with demyelinating disease. Infectious, inflammatory, and ischemic myelitis are considered less likely. Electronically signed by: Evalene Coho MD 12/06/2023 07:23 AM EDT RP Workstation: [id number]   MR Brain W and Wo Contrast Result Date: 12/05/2023 EXAM: MRI BRAIN WITH AND WITHOUT CONTRAST 12/05/2023 05:56:58 AM TECHNIQUE: Multiplanar multisequence MRI of the head/brain was performed with and without the administration of intravenous contrast. COMPARISON: None available. CLINICAL HISTORY: Trigeminal neuralgia suspected, check for mass lesions evidence of multiple  sclerosis. 7ml gadavist . Arrived via [facility] with CC of headache and dizziness. Per EMS, the headache has been ongoing for several months, but tonight the pain is 10/10. EMS reports that pain is described as stabbing and burning pain and is on the right side of the head. Pt states that it hurts to open right eye. FINDINGS: BRAIN AND VENTRICLES: No acute infarct. No acute intracranial hemorrhage. No mass effect or midline shift. No hydrocephalus. The sella is unremarkable. Normal flow voids. There is mild subcortical white matter disease present bilaterally. ORBITS: The optic nerves appear normal in morphology and signal intensity and demonstrate no abnormal enhancement. SINUSES: No acute abnormality. BONES AND SOFT TISSUES: Normal bone marrow signal and enhancement. No acute soft tissue abnormality. There is increased T2 signal present throughout the visualized spinal cord at the cervical medullary junction. There is also abnormal enhancement along the right lateral and left posterolateral surfaces of the upper cervical spinal cord. Follow up MRI of the cervical, thoracic and lumbar spine is recommended without and with gadolinium contrast. IMPRESSION: 1. Abnormal T2 signal throughout the visualized cervical cord and abnormal enhancement along the right lateral and left posterolateral surfaces of the upper cervical spinal cord. The findings are concerning for demyelinating disease, but neoplasm cannot be excluded. Recommend correlation with dedicated MRI of the cervical, thoracic, and lumbar spine  without and with gadolinium contrast 2. The above findings were discussed with Dr. 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  I have notified neurology on patient's arrival and they will place orders for PLEX.  Critical care consulted as patient will need central venous catheter. -Continue Solu-Medrol  1 g every 24 hours x 5 days (end date 9/27) -Continue carbamazepine  200 mg 3 times daily -Continue gabapentin  900 mg 3 times daily -Continue Oxy IR PRN until pain is better controlled -Continue IV Protonix  40 mg daily for GI prophylaxis in the setting of high-dose steroid use  Anxiety/depression Continue Cymbalta  and trazodone   DVT prophylaxis: SCDs Code Status: Full Code (discussed with the patient) Family Communication: No family available at this time. Consults called: Neurology, PCCM Level of care: Progressive Care Unit Admission status: It is my clinical opinion that admission to INPATIENT is reasonable and necessary because of the expectation that this patient will require hospital care that crosses at least 2 midnights to treat this condition based on the medical complexity of the problems presented.  Given the aforementioned information, the predictability of an adverse outcome is felt to be significant.  Editha Ram MD [facility]  If 7PM-7AM, please contact night-coverage [email or web address]  12/07/2023, 12:26 AM

## 2023-12-07 NOTE — TOC Initial Note (Signed)
 Transition of Care [facility]) - Initial/Assessment Note    Patient Details  Name: Patricia Patton MRN: [id number] Date of Birth: Nov 20, 1970  [facility] CM/SW Contact:    Andrez JULIANNA George, RN Phone Number: 12/07/2023, 10:35 AM  Clinical Narrative:                  Patricia Patton is a 53 y.o. female with medical history significant of anxiety/depression, onset of right-sided trigeminal neuralgia 2 months ago increasing in frequency and severity and now including V1 on left side, neck pain radiating to her bilateral shoulders.  Pt is from home with her spouse and 82 yo daughter. Spouse works during the daytime. Daughter in school. No DME.  Pt drives self and manages her own medications. Plan: PLEX  IP Care management following.  Expected Discharge Plan: Home/Self Care Barriers to Discharge: Continued Medical Work up   Patient Goals and CMS Choice            Expected Discharge Plan and Services   Discharge Planning Services: CM Consult   Living arrangements for the past 2 months: Single Family Home                                      Prior Living Arrangements/Services Living arrangements for the past 2 months: Single Family Home Lives with:: Spouse Patient language and need for interpreter reviewed:: Yes Do you feel safe going back to the place where you live?: Yes            Criminal Activity/Legal Involvement Pertinent to Current Situation/Hospitalization: No - Comment as needed  Activities of Daily Living   ADL Screening (condition at time of admission) Independently performs ADLs?: Yes (appropriate for developmental age) Is the patient deaf or have difficulty hearing?: No Does the patient have difficulty seeing, even when wearing glasses/contacts?: No Does the patient have difficulty concentrating, remembering, or making decisions?: No  Permission Sought/Granted                  Emotional Assessment Appearance::  Appears stated age Attitude/Demeanor/Rapport: Engaged Affect (typically observed): Accepting Orientation: : Oriented to Self, Oriented to Place, Oriented to  Time, Oriented to Situation   Psych Involvement: No (comment)  Admission diagnosis:  Neuromyelitis optica (devic) [G36.0] Neuromyelitis optica spectrum disorder (HCC) [G36.0] Patient Active Problem List   Diagnosis Date Noted   Neuromyelitis optica spectrum disorder (HCC) 12/07/2023   Anxiety and depression 12/07/2023   Intractable headache 12/05/2023   Demyelinating disease (HCC) 12/05/2023   PCP:  Waylan Maxwell, MD Pharmacy:   [facility] [address] [place] - [address] [place] [address] Phone: [phone] Fax: [phone]     Social Drivers of Health (SDOH) Social History: SDOH Screenings   Food Insecurity: No Food Insecurity (12/06/2023)  Housing: Low Risk  (12/06/2023)  Transportation Needs: No Transportation Needs (12/06/2023)  Utilities: Not At Risk (12/06/2023)  Financial Resource Strain: Low Risk  (07/30/2023)   Received from [facility]  Physical Activity: Insufficiently Active (08/13/2018)   Received from [facility]  Social Connections: Unknown (08/13/2018)   Received from [facility]  Tobacco Use: Medium Risk (12/05/2023)  Health Literacy: Low Risk  (06/20/2020)   Received from [facility]   SDOH Interventions:     Readmission Risk Interventions     No data to display

## 2023-12-07 NOTE — Progress Notes (Signed)
 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  Vitals   Vitals:   12/07/23 0000 12/07/23 0329 12/07/23 0716 12/07/23 0819  BP: (!) 146/101 (!) 145/97 (!) 156/102   Pulse: 93 84 87   Resp: 16 16 18    Temp: 98.2 F (36.8 C) 98.6 F (37 C) 97.9 F (36.6 C)   TempSrc: Oral Oral Oral   SpO2: 97% 95% 98%   Weight:    60.1 kg  Height:    5' 2 (1.575 m)     Body mass index is 24.23 kg/m.  Physical Exam   Constitutional: Appears well-developed and well-nourished.  Neurologic Examination    MS: Awake, alert, interactive and appropriate CN: Pupils reactive bilaterally, EOMI she has very mild left facial weakness from an old Bell's palsy Motor: She has very mild 4/5 weakness of the left arm and leg which was not documented previously, 5/5 on the right Sensory: Diminished in a stocking glove distribution  Medications  Current Facility-Administered Medications:    acetaminophen  (TYLENOL ) tablet 650 mg, 650 mg, Oral, Q6H PRN **OR** acetaminophen  (TYLENOL ) suppository 650 mg, 650 mg, Rectal, Q6H PRN, Patricia Madison, MD   carbamazepine  (TEGRETOL ) chewable tablet 200 mg, 200 mg, Oral, TID, Patton, Vasundhra, MD, 200 mg at 12/07/23 9177   DULoxetine  (CYMBALTA ) DR capsule 60 mg, 60 mg, Oral, Daily, Patton, Vasundhra, MD, 60 mg at 12/07/23 9177   gabapentin  (NEURONTIN ) capsule 900 mg, 900 mg, Oral, TID, Patton, Vasundhra, MD, 900 mg at 12/07/23 9178   methylPREDNISolone  sodium succinate (SOLU-MEDROL ) 1,000 mg in sodium chloride  0.9 % 50 mL IVPB, 1,000 mg, Intravenous, Q24H, Patton, Vasundhra, MD   naloxone  (NARCAN ) injection 0.4 mg, 0.4 mg, Intravenous, PRN, Patricia Madison, MD   oxyCODONE  (Oxy IR/ROXICODONE ) immediate release tablet 5 mg, 5 mg, Oral, Q6H PRN, Patton, Vasundhra, MD, 5 mg at 12/07/23 9178   pantoprazole  (PROTONIX ) injection 40 mg, 40 mg, Intravenous, Q24H, Patton, Vasundhra, MD, 40 mg at 12/07/23 9177   traZODone  (DESYREL ) tablet 150 mg, 150 mg, Oral, QHS, Patricia Madison, MD  Labs and Diagnostic Imaging   Previous workup for small fiber neuropathy from June 2024: Anti-dsDNA 20         < 4 Anti Chromatin <1 AI <0.2   Anti Ribosomal P <1 AI <0.2   Anti SS-A <1 AI <0.2   Anti SS-B <1 AI <0.2   Anti Centromere B <1 AI <0.2   Anti-SM <1 AI <0.2   Anti-SM/RNP <1 AI <0.2   Anti-RNP <1 AI <0.2   Anti Scl-70 <1 AI <0.2   Anti Jo-1 <1 AI <0.2    Rheumatoid factor negative ANCA-negative SPEP was normal B1 was normal She had a skin biopsy that was positive for small fiber neuropathy  Imaging(Personally reviewed): MRI cervical spine-longitudinally extensive transverse myelitis, it is not homogenously enhancing but does enhance some.  Assessment   Patricia Patton is a 53 y.o. 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  She has negative anti-Mog and anti-aqp4 antibodies,   Recommendations  Send autoimmune testing, I do wonder if her small fiber neuropathy and  transverse myelitis are related.  I wonder if her trigeminal pain might improve with immunosuppression as well. Continue gabapentin  900 mg 3 times daily Continue carbamazepine  200 mg 3 times daily Autoimmune myelitis panel to [facility] ANA, ANCA, ENA Start plasma exchange today in addition to Solu-Medrol  Neurology will continue to follow ______________________________________________________________________   Signed, Patricia Seals, MD [facility]

## 2023-12-07 NOTE — Progress Notes (Signed)
 TPE Treatment # 1  12/07/23 1625  Vitals  Temp 98.1 F (36.7 C)  Temp Source Oral  Pulse Rate 95  ECG Heart Rate 98  Resp 19  BP (!) 140/81  Oxygen Therapy  SpO2 96 %  O2 Device Room Air  Patient Activity (if Appropriate) In bed  Pulse Oximetry Type Continuous  Pain Assessment  Pain Scale 0-10  Pain Score 0  Apheresis   Procedure Comments Tx. complete without difficulties. Report given to 3W Bedside RN  Post-apheresis  Net Removed (mL) 2737 mL  Replacement (mL) 2094 mL  ACDA infused (mL) 496 mL  Total Normal Saline Administered 172 mL  Total Calcium  Administered 100 mL  Tolerated Procedure Yes  Post-Procedure Comments Pt. resting and eating a [place] sandwich. Pt. and Bedside RN was given Report that the patient will return for TPE tx #2 on Saturday 27, 2025.  Hemodialysis Catheter Right Internal jugular Triple lumen Temporary (Non-Tunneled)  Placement Date/Time: 12/07/23 1124   Serial / Lot #: [id number]  Expiration Date: 10/12/26  Time Out: Correct patient;Correct site;Correct procedure  Maximum sterile barrier precautions: Hand hygiene;Cap;Mask;Sterile gown;Sterile gloves;Large sterile sh...  Blue Lumen Status Flushed;Antimicrobial dead end cap;Heparin  locked;Blood return noted  Red Lumen Status Flushed;Antimicrobial dead end cap;Heparin  locked;Blood return noted  Purple Lumen Status Saline locked  Catheter fill solution Heparin  1000 units/ml  Catheter fill volume (Arterial) 1.2 cc  Catheter fill volume (Venous) 1.2  Dressing Type Transparent  Drainage Description None  Dressing Change Due 12/13/23  Post treatment catheter status Capped and Clamped

## 2023-12-07 NOTE — Plan of Care (Signed)
progessing

## 2023-12-07 NOTE — Progress Notes (Addendum)
  IR BRIEF PROGRESS NOTE:  IR was requested for tunneled trialysis central venous catheter for plasmapheresis. Patient will receive 5 treatments over the next several days, while inpatient. Per Dr. Jennefer, IR attending, a tunneled line placed under moderate sedation is needed only if patient requires outpatient or long-term therapy. Otherwise, a temporary line with local anesthesia only would be sufficient, and IR could accommodate today. Care team advised, and approved patient for temporary catheter at this time. IR will gladly convert the current temporary line to a tunneled line should treatment plan change.    Electronically Signed: Carlin DELENA Griffon, PA-C 12/07/2023, 4:17 PM

## 2023-12-07 NOTE — Procedures (Signed)
 Vascular and Interventional Radiology Procedure Note  Patient: Patricia Patton [facility] DOB: 1970/08/31 Medical Record Number: [id number] Note Date/Time: 12/07/23 11:04 AM   Performing Physician: Thom Hall, MD Assistant(s): None  Diagnosis: Plasmapheresis. Hx NMO  Procedure: NON-TUNNELED PLASMAPHERESIS CATHETER PLACEMENT  Anesthesia: Local Anesthetic Complications: None Estimated Blood Loss: Minimal Specimens:  None  Findings:  Successful placement of a right-sided, 16 cm NON-tunneled PLEX catheter with the tip of the catheter in the proximal right atrium.  Plan: Catheter ready for use. This catheter may be converted to a tunneled PLEX catheter at a later date as indicated.  See detailed procedure note with images in PACS. The patient tolerated the procedure well without incident or complication and was returned to Recovery in stable condition.    Thom Hall, MD [facility] Radiology   Pager. [phone] Clinic. [phone]

## 2023-12-07 NOTE — Progress Notes (Signed)
 Brief Neuro Note:  PLEX ordered, will need tunneled HD cath placed in AM by either PCCM or IR before PLEX can be started.  Shamaria Kavan [facility]

## 2023-12-07 NOTE — Progress Notes (Signed)
 Seen after midnight  53 year old white female Right sided trigeminal neuralgia with increasing frequency Comorbid anxiety depression ADHD OCD on Cymbalta , currently Xanax-discontinued Lamictal -not treated ADHD currently Recent treatment for shingles OSA intolerant of CPAP Burning paresthesias in upper and lower extremities?  Small fiber neuropathy?  Fibromyalgia last evaluated in person at [facility] neurology 08/16/2022 and referred to pain management additionally Referred to rheumatology--Cymbalta  was added for the neuropathy and comorbid depression   9/23 [facility] admission worsening right-sided facial bilateral shoulder pain paroxysmal becoming near constant-note was on 900 gabapentin  3 times daily Tegretol  100 3 times daily without benefit   Discharged to [facility] for Plex and placement of catheter and started on high-dose steroids     Labs on admit     Imaging Abnormal T2 signal throughout visualized cervical cord abnormal enhancement right lateral and left posterolateral upper cervical spinal cord concerning for demyelination    Assessment  & Plan :      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 Mood disorder as above Small fiber neuropathy/fibromyalgia  Underwent placement of HD cath and is seen currently on HD unit getting concurrent Plex with Solu-Medrol  1 g Q24 Follow [facility] test [id number] for demyelinating disease eval Continue usual home meds Tegretol  and gabapentin    BP (!) 140/81   Pulse 95   Temp 98.1 F (36.7 C) (Oral)   Resp 19   Ht 5' 2 (1.575 m)   Wt 60.1 kg   SpO2 96%   BMI 24.23 kg/m  Finger-nose-finger intact vision by direct confrontation intact power 5/5  Main issue is headache on right hemicranium She is stable and we await further discussion with neurology  No charge   Reggie Grimes, MD [facility] 4:40 PM

## 2023-12-08 DIAGNOSIS — G360 Neuromyelitis optica [Devic]: Secondary | ICD-10-CM | POA: Diagnosis not present

## 2023-12-08 LAB — PROTEIN AND GLUCOSE, CSF
Glucose, CSF: 83 mg/dL — ABNORMAL HIGH (ref 40–70)
Total  Protein, CSF: 31 mg/dL (ref 15–45)

## 2023-12-08 LAB — CSF CELL COUNT WITH DIFFERENTIAL
Eosinophils, CSF: 0 % (ref 0–1)
Eosinophils, CSF: 0 % (ref 0–1)
Lymphs, CSF: 87 % — ABNORMAL HIGH (ref 40–80)
Lymphs, CSF: 94 % — ABNORMAL HIGH (ref 40–80)
Monocyte-Macrophage-Spinal Fluid: 12 % — ABNORMAL LOW (ref 15–45)
Monocyte-Macrophage-Spinal Fluid: 6 % — ABNORMAL LOW (ref 15–45)
RBC Count, CSF: 2 /mm3 — ABNORMAL HIGH
RBC Count, CSF: 1 /mm3 — ABNORMAL HIGH
Segmented Neutrophils-CSF: 1 % (ref 0–6)
Segmented Neutrophils-CSF: 0 % (ref 0–6)
Tube #: 2
Tube #: 4
WBC, CSF: 18 /mm3 (ref 0–5)
WBC, CSF: 17 /mm3 (ref 0–5)

## 2023-12-08 LAB — MENINGITIS/ENCEPHALITIS PANEL (CSF)

## 2023-12-08 LAB — NEUROMYELITIS OPTICA AUTOAB, IGG: NMO-IgG: <1.5 U/mL (ref 0.0–3.0)

## 2023-12-08 LAB — PROTIME-INR
INR: 1.3 — ABNORMAL HIGH (ref 0.8–1.2)
Prothrombin Time: 16.4 s — ABNORMAL HIGH (ref 11.4–15.2)

## 2023-12-08 NOTE — Progress Notes (Signed)
 [facility]   ROUNDING   NOTE Patricia Patton FMW:[id number]  DOB: January 01, 1971  DOA: 12/06/2023  PCP: Patricia Rayfield HERO, MD  12/08/2023,11:54 AM  LOS: 2 days    Code Status:  full     From:  home   53 year old white female Right sided trigeminal neuralgia with increasing frequency Comorbid anxiety depression ADHD OCD on Cymbalta , currently Xanax-discontinued Lamictal -not treated ADHD currently Recent treatment for shingles OSA intolerant of CPAP Burning paresthesias in upper and lower extremities?  Small fiber neuropathy?  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 Autoimmune workup in process ESR CRP notably negative Follow ANA ANCA titers antiextractable nuclear antigen and other workup from [facility] as per neuro Follow neuromyelitis optica autoantibodies IgG Continues on Plex per neurology as well as Solu-Medrol  1000 Q24 Mood disorder Continue Cymbalta  60 trazodone  150--- 4 trigeminal neuralgia can continue Tegretol  100 3 times daily gabapentin  300 3 times daily Small fiber neuropathy Continue oxycodone , Cymbalta  for pain control and comorbid depression Impaired glucose tolerance Observe only for now    Data Reviewed:   No labs today  DVT prophylaxis: SCD  Status is: Inpatient Remains inpatient appropriate because:   Requires further neuro insights     Current Dispo: Hopefully home     Subjective:    Overall some better-tells me he was at the beach 1 week before the severe headache  started in the hot sun denies any history of MS in her family--expresses some concerns about what an autoimmune process is not explained as best I could what we think this is in terms of neuromyelitis States that was taking Tegretol  and gabapentin  2 weeks prior with goal to titrate meds but this only helped marginally    Objective + exam Vitals:   12/07/23 2000 12/07/23 2338 12/08/23 0351 12/08/23 0752  BP: 127/86 111/85 115/75 122/77  Pulse: (!) 106 (!) 108 92 88  Resp: 19 18 17 14   Temp: (!) 97.5 F (36.4 C) 98.4 F (36.9 C) 97.8 F (36.6 C) 98.1 F (36.7 C)  TempSrc: Oral Oral Oral Oral  SpO2: 94% 93% 93% 92%  Weight:      Height:       Filed Weights   12/07/23 0819 12/07/23 0906 12/07/23 1458  Weight: 60.1 kg 60.1 kg 60.1 kg     Examination: EOMI NCAT no focal deficit no icterus no pallor Chest is clear Power is 5/5 S1-S2 no murmur ROM is intact No lower extremity edema  Scheduled Meds:  carbamazepine   200 mg Oral TID   Chlorhexidine  Gluconate Cloth  6 each Topical Daily   DULoxetine   60 mg Oral Daily   gabapentin   900 mg Oral TID   pantoprazole   40 mg Intravenous Q24H   sodium chloride  flush  10-40 mL Intracatheter Q12H   traZODone   150 mg Oral QHS   Continuous Infusions:  citrate dextrose      methylPREDNISolone  sodium succinate (SOLU-MEDROL ) 1,000 mg in sodium chloride  0.9 % 50 mL IVPB 1,000 mg (12/08/23 0936)    Time 35  Patricia  Royal, MD  [facility]

## 2023-12-08 NOTE — Plan of Care (Signed)

## 2023-12-08 NOTE — Progress Notes (Signed)
 NEUROLOGY CONSULT FOLLOW UP NOTE   Date of service: December 08, 2023 Patient Name: Patricia Patton MRN:  [id number] DOB:  10-31-1970  Interval Hx/subjective   Patient was evaluated for trigeminal neuralgia like symptoms and found to have a longitudinally extensive transverse myelitis with enhancement extending from the medullary junction down to C6.  Vitals   Vitals:   12/07/23 2338 12/08/23 0351 12/08/23 0752 12/08/23 1158  BP: 111/85 115/75 122/77 129/86  Pulse: (!) 108 92 88 89  Resp: 18 17 14 14   Temp: 98.4 F (36.9 C) 97.8 F (36.6 C) 98.1 F (36.7 C) 98.4 F (36.9 C)  TempSrc: Oral Oral Oral Oral  SpO2: 93% 93% 92% 94%  Weight:      Height:         Body mass index is 24.23 kg/m.  Physical Exam   Constitutional: Appears well-developed and well-nourished.  Neurologic Examination    MS: Awake, alert, interactive and appropriate CN: Pupils reactive bilaterally, EOMI she has very mild left facial weakness from an old Bell's palsy Motor: She has very mild 4/5 weakness of the left arm and leg which was not documented previously, 5/5 on the right Sensory: Diminished on the right compared to left.   Medications  Current Facility-Administered Medications:    acetaminophen  (TYLENOL ) tablet 650 mg, 650 mg, Oral, Q4H PRN, Khaliqdina, Salman, MD, 650 mg at 12/07/23 2059   carbamazepine  (TEGRETOL ) chewable tablet 200 mg, 200 mg, Oral, TID, Rathore, Vasundhra, MD, 200 mg at 12/08/23 9062   Chlorhexidine  Gluconate Cloth 2 % PADS 6 each, 6 each, Topical, Daily, Samtani, Jai-Gurmukh, MD, 6 each at 12/07/23 1810   citrate dextrose  (ACD-A  anticoagulant) solution 1,000 mL, 1,000 mL, Other, Continuous, Khaliqdina, Salman, MD, 1,000 mL at 12/07/23 1443   diphenhydrAMINE  (BENADRYL ) capsule 25 mg, 25 mg, Oral, Q6H PRN, Michaela Aisha SQUIBB, MD, 25 mg at 12/07/23 2100   DULoxetine  (CYMBALTA ) DR capsule 60 mg, 60 mg, Oral, Daily, Rathore, Vasundhra, MD, 60 mg at 12/08/23  9062   gabapentin  (NEURONTIN ) capsule 900 mg, 900 mg, Oral, TID, Rathore, Vasundhra, MD, 900 mg at 12/08/23 9062   methylPREDNISolone  sodium succinate (SOLU-MEDROL ) 1,000 mg in sodium chloride  0.9 % 50 mL IVPB, 1,000 mg, Intravenous, Q24H, Alfornia Madison, MD, Last Rate: 66 mL/hr at 12/08/23 0936, 1,000 mg at 12/08/23 9063   naloxone  (NARCAN ) injection 0.4 mg, 0.4 mg, Intravenous, PRN, Rathore, Vasundhra, MD   oxyCODONE  (Oxy IR/ROXICODONE ) immediate release tablet 5 mg, 5 mg, Oral, Q6H PRN, Rathore, Vasundhra, MD, 5 mg at 12/08/23 0802   pantoprazole  (PROTONIX ) injection 40 mg, 40 mg, Intravenous, Q24H, Rathore, Vasundhra, MD, 40 mg at 12/08/23 9061   sodium chloride  flush (NS) 0.9 % injection 10-40 mL, 10-40 mL, Intracatheter, Q12H, Samtani, Jai-Gurmukh, MD, 10 mL at 12/08/23 9061   sodium chloride  flush (NS) 0.9 % injection 10-40 mL, 10-40 mL, Intracatheter, PRN, Samtani, Jai-Gurmukh, MD   traZODone  (DESYREL ) tablet 150 mg, 150 mg, Oral, QHS, Rathore, Vasundhra, MD, 150 mg at 12/07/23 2100  Labs and Diagnostic Imaging   Previous workup for small fiber neuropathy from June 2024: Anti-dsDNA 20         < 4 Anti Chromatin <1 AI <0.2   Anti Ribosomal P <1 AI <0.2   Anti SS-A <1 AI <0.2   Anti SS-B <1 AI <0.2   Anti Centromere B <1 AI <0.2   Anti-SM <1 AI <0.2   Anti-SM/RNP <1 AI <0.2   Anti-RNP <1 AI <0.2   Anti Scl-70 <1  AI <0.2   Anti Jo-1 <1 AI <0.2    Rheumatoid factor negative ANCA-negative SPEP was normal B1 was normal She had a skin biopsy that was positive for small fiber neuropathy  Imaging(Personally reviewed): MRI cervical spine-longitudinally extensive transverse myelitis, it is not homogenously enhancing but does enhance some.  Assessment   Patricia Patton is a 53 y.o. female with history of idiopathic small fiber neuropathy, presenting with bilateral facial pain and blurred vision (which is improved) who was found to have longitudinally extensive  transverse myelitis.  She has negative anti-Mog and anti-aqp4 antibodies, which makes neuromyelitis optica much less likely.  At this point, my suspicion that this is some type of autoimmune process is very high and is for this reason that she has been started on plasmapheresis  Recommendations  Send autoimmune testing, I do wonder if her small fiber neuropathy and transverse myelitis are related.  I wonder if her trigeminal pain might improve with immunosuppression as well. Continue gabapentin  900 mg 3 times daily Continue carbamazepine  200 mg 3 times daily Autoimmune myelitis panel to [facility] ANA, ANCA, ENA Start plasma exchange today in addition to Solu-Medrol  Neurology will continue to follow ______________________________________________________________________   Signed, Aisha Seals, MD [facility]

## 2023-12-08 NOTE — Plan of Care (Signed)

## 2023-12-08 NOTE — Procedures (Signed)
 LUMBAR PUNCTURE (SPINAL TAP) PROCEDURE NOTE  Indication: evaluate CSF for inflammatory process    Proceduralists: Karna Geralds NP, Dr. Michaela    Risks, benefits and alternatives of the procedure were dicussed with the patient including but not limited to post-LP headache, bleeding, infection, weakness/numbness of legs(radiculopathy), death.    Consent obtained from: patient    Procedure Note The patient was prepped and draped, and using sterile technique a 20 gauge quinke spinal needle was inserted in the L4-5 space.   Approximately 13 cc of clear CSF were obtained and sent for analysis.  Patient tolerated the procedure well and blood loss was minimal.   Karna Geralds NP

## 2023-12-09 ENCOUNTER — Inpatient Hospital Stay (HOSPITAL_COMMUNITY)

## 2023-12-09 DIAGNOSIS — G360 Neuromyelitis optica [Devic]: Secondary | ICD-10-CM | POA: Diagnosis not present

## 2023-12-09 LAB — CBC WITH DIFFERENTIAL/PLATELET
Abs Immature Granulocytes: 0.07 K/uL (ref 0.00–0.07)
Basophils Absolute: 0 K/uL (ref 0.0–0.1)
Basophils Relative: 0 %
Eosinophils Absolute: 0 K/uL (ref 0.0–0.5)
Eosinophils Relative: 0 %
HCT: 34.9 % — ABNORMAL LOW (ref 36.0–46.0)
Hemoglobin: 11.9 g/dL — ABNORMAL LOW (ref 12.0–15.0)
Immature Granulocytes: 1 %
Lymphocytes Relative: 21 %
Lymphs Abs: 2.3 K/uL (ref 0.7–4.0)
MCH: 29.8 pg (ref 26.0–34.0)
MCHC: 34.1 g/dL (ref 30.0–36.0)
MCV: 87.5 fL (ref 80.0–100.0)
Monocytes Absolute: 0.9 K/uL (ref 0.1–1.0)
Monocytes Relative: 8 %
Neutro Abs: 7.6 K/uL (ref 1.7–7.7)
Neutrophils Relative %: 70 %
Platelets: 238 K/uL (ref 150–400)
RBC: 3.99 MIL/uL (ref 3.87–5.11)
RDW: 11.8 % (ref 11.5–15.5)
WBC: 10.8 K/uL — ABNORMAL HIGH (ref 4.0–10.5)
nRBC: 0 % (ref 0.0–0.2)

## 2023-12-09 LAB — BASIC METABOLIC PANEL WITH GFR
Anion gap: 11 (ref 5–15)
BUN: 16 mg/dL (ref 6–20)
CO2: 27 mmol/L (ref 22–32)
Calcium: 8.8 mg/dL — ABNORMAL LOW (ref 8.9–10.3)
Chloride: 102 mmol/L (ref 98–111)
Creatinine, Ser: 0.83 mg/dL (ref 0.44–1.00)
GFR, Estimated: >60 mL/min (ref >60)
Glucose, Bld: 115 mg/dL — ABNORMAL HIGH (ref 70–99)
Potassium: 3.6 mmol/L (ref 3.5–5.1)
Sodium: 140 mmol/L (ref 135–145)

## 2023-12-09 LAB — ANTIEXTRACTABLE NUCLEAR AG
ENA SM Ab Ser-aCnc: <0.2 AI (ref 0.0–0.9)
Ribonucleic Protein: <0.2 AI (ref 0.0–0.9)

## 2023-12-09 LAB — ANTINUCLEAR ANTIBODIES, IFA: ANA Ab, IFA: NEGATIVE

## 2023-12-09 LAB — TROPONIN I (HIGH SENSITIVITY)
Troponin I (High Sensitivity): 5 ng/L (ref <18)
Troponin I (High Sensitivity): 6 ng/L (ref <18)

## 2023-12-09 LAB — D-DIMER, QUANTITATIVE: D-Dimer, Quant: <0.27 ug{FEU}/mL (ref 0.00–0.50)

## 2023-12-09 MED ORDER — ALBUMIN HUMAN 25 % IV SOLN
Freq: Once | INTRAVENOUS | Status: DC
  Filled 2023-12-09: qty 200

## 2023-12-09 MED ORDER — DIPHENHYDRAMINE HCL 25 MG PO CAPS
25.0000 mg | ORAL_CAPSULE | Freq: Four times a day (QID) | ORAL | Status: AC | PRN
  Administered 2023-12-09: 25 mg via ORAL

## 2023-12-09 MED ORDER — CALCIUM CARBONATE ANTACID 500 MG PO CHEW
CHEWABLE_TABLET | ORAL | Status: AC
Start: 2023-12-09 — End: 2023-12-09
  Filled 2023-12-09: qty 2

## 2023-12-09 MED ORDER — ALBUMIN HUMAN 25 % IV SOLN
Freq: Once | INTRAVENOUS | Status: AC
  Filled 2023-12-09: qty 200

## 2023-12-09 MED ORDER — CALCIUM CARBONATE ANTACID 500 MG PO CHEW
2.0000 | CHEWABLE_TABLET | ORAL | Status: DC
  Administered 2023-12-09: 400 mg via ORAL
  Filled 2023-12-09: qty 2

## 2023-12-09 MED ORDER — HYOSCYAMINE SULFATE 0.125 MG SL SUBL
0.2500 mg | SUBLINGUAL_TABLET | Freq: Once | SUBLINGUAL | Status: AC
  Administered 2023-12-09: 0.25 mg via SUBLINGUAL
  Filled 2023-12-09: qty 2

## 2023-12-09 MED ORDER — DICYCLOMINE HCL 10 MG PO CAPS
10.0000 mg | ORAL_CAPSULE | Freq: Once | ORAL | Status: AC
  Administered 2023-12-09: 10 mg via ORAL
  Filled 2023-12-09: qty 1

## 2023-12-09 MED ORDER — ACETAMINOPHEN 325 MG PO TABS
650.0000 mg | ORAL_TABLET | Freq: Four times a day (QID) | ORAL | Status: DC | PRN
  Administered 2023-12-09 – 2023-12-11 (×4): 650 mg via ORAL
  Filled 2023-12-09 (×4): qty 2

## 2023-12-09 MED ORDER — DIPHENHYDRAMINE HCL 25 MG PO CAPS
25.0000 mg | ORAL_CAPSULE | Freq: Once | ORAL | Status: AC
Start: 2023-12-09 — End: 2023-12-09
  Administered 2023-12-09: 25 mg via ORAL
  Filled 2023-12-09: qty 1

## 2023-12-09 MED ORDER — SODIUM CHLORIDE 0.9 % IV SOLN
Freq: Once | INTRAVENOUS | Status: AC
  Filled 2023-12-09: qty 200

## 2023-12-09 MED ORDER — NITROGLYCERIN 0.4 MG SL SUBL
SUBLINGUAL_TABLET | SUBLINGUAL | Status: AC
  Filled 2023-12-09: qty 1

## 2023-12-09 MED ORDER — ACETAMINOPHEN 325 MG PO TABS
ORAL_TABLET | ORAL | Status: AC
  Filled 2023-12-09: qty 2

## 2023-12-09 MED ORDER — ANTICOAGULANT SODIUM CITRATE 4% (200MG/5ML) IV SOLN: 5.0000 mL | Status: DC | PRN

## 2023-12-09 MED ORDER — CALCIUM GLUCONATE-NACL 2-0.675 GM/100ML-% IV SOLN
INTRAVENOUS | Status: AC
  Filled 2023-12-09: qty 100

## 2023-12-09 MED ORDER — ACD FORMULA A 0.73-2.45-2.2 GM/100ML VI SOLN
1000.0000 mL | Status: DC
  Filled 2023-12-09: qty 1000

## 2023-12-09 MED ORDER — DIPHENHYDRAMINE HCL 25 MG PO CAPS
ORAL_CAPSULE | ORAL | Status: AC
  Filled 2023-12-09: qty 1

## 2023-12-09 MED ORDER — NITROGLYCERIN 0.4 MG SL SUBL
0.4000 mg | SUBLINGUAL_TABLET | SUBLINGUAL | Status: DC | PRN
  Administered 2023-12-09: 0.4 mg via SUBLINGUAL

## 2023-12-09 MED ORDER — CALCIUM CARBONATE ANTACID 500 MG PO CHEW
2.0000 | CHEWABLE_TABLET | ORAL | Status: DC
  Administered 2023-12-09 (×2): 400 mg via ORAL

## 2023-12-09 MED ORDER — ALBUMIN HUMAN 25 % IV SOLN: Freq: Once | INTRAVENOUS | Status: DC

## 2023-12-09 MED ORDER — HYOSCYAMINE SULFATE 0.125 MG SL SUBL
0.2500 mg | SUBLINGUAL_TABLET | Freq: Once | SUBLINGUAL | Status: DC
  Filled 2023-12-09 (×2): qty 2

## 2023-12-09 MED ORDER — CALCIUM GLUCONATE-NACL 2-0.675 GM/100ML-% IV SOLN
2.0000 g | Freq: Once | INTRAVENOUS | Status: AC
  Administered 2023-12-09: 2000 mg via INTRAVENOUS
  Filled 2023-12-09: qty 100

## 2023-12-09 MED ORDER — IOHEXOL 350 MG/ML SOLN
75.0000 mL | Freq: Once | INTRAVENOUS | Status: AC | PRN
  Administered 2023-12-09: 75 mL via INTRAVENOUS

## 2023-12-09 MED ORDER — ALUM & MAG HYDROXIDE-SIMETH 200-200-20 MG/5ML PO SUSP
30.0000 mL | Freq: Once | ORAL | Status: AC
  Administered 2023-12-09: 30 mL via ORAL
  Filled 2023-12-09: qty 30

## 2023-12-09 MED ORDER — HEPARIN SODIUM (PORCINE) 1000 UNIT/ML IJ SOLN
INTRAMUSCULAR | Status: AC
Start: 2023-12-09 — End: 2023-12-09
  Filled 2023-12-09: qty 3

## 2023-12-09 MED ORDER — MELATONIN 5 MG PO TABS
10.0000 mg | ORAL_TABLET | Freq: Once | ORAL | Status: AC
  Administered 2023-12-09: 10 mg via ORAL
  Filled 2023-12-09: qty 2

## 2023-12-09 NOTE — Progress Notes (Signed)
 NEUROLOGY CONSULT FOLLOW UP NOTE   Date of service: December 09, 2023 Patient Name: Patricia Patton MRN:  [id number] DOB:  05/30/1970  Interval Hx/subjective   I saw the patient shortly after she began complaining of some chest pressure at the end of plasmapheresis.  Vitals   Vitals:   12/09/23 1241 12/09/23 1256 12/09/23 1402 12/09/23 1535  BP: (!) 126/94 119/86 116/79 110/72  Pulse: 85 (!) 103 91 90  Resp: 15 20 18 14   Temp:   98.6 F (37 C) 98.5 F (36.9 C)  TempSrc:   Oral Oral  SpO2: 97% 93% 97% 97%  Weight:      Height:         Body mass index is 24.52 kg/m.  Physical Exam   Constitutional: Appears well-developed and well-nourished.  Neurologic Examination    MS: Awake, alert, interactive and appropriate CN: Pupils reactive bilaterally, EOMI she has very mild left facial weakness from an old Bell's palsy Motor: She has very mild 4/5 weakness of the left arm and leg which was not documented previously, 5/5 on the right Sensory: Diminished on the right compared to left.   Medications  Current Facility-Administered Medications:    acetaminophen  (TYLENOL ) tablet 650 mg, 650 mg, Oral, Q6H PRN, Rathore, Vasundhra, MD, 650 mg at 12/09/23 1101   anticoagulant sodium citrate solution 5 mL, 5 mL, Intracatheter, PRN, Rathore, Vasundhra, MD   carbamazepine  (TEGRETOL ) chewable tablet 200 mg, 200 mg, Oral, TID, Rathore, Vasundhra, MD, 200 mg at 12/09/23 1525   Chlorhexidine  Gluconate Cloth 2 % PADS 6 each, 6 each, Topical, Daily, Samtani, Jai-Gurmukh, MD, 6 each at 12/09/23 1529   DULoxetine  (CYMBALTA ) DR capsule 60 mg, 60 mg, Oral, Daily, Rathore, Vasundhra, MD, 60 mg at 12/09/23 0818   gabapentin  (NEURONTIN ) capsule 900 mg, 900 mg, Oral, TID, Rathore, Vasundhra, MD, 900 mg at 12/09/23 1525   naloxone  (NARCAN ) injection 0.4 mg, 0.4 mg, Intravenous, PRN, Rathore, Vasundhra, MD   nitroGLYCERIN (NITROSTAT) SL tablet 0.4 mg, 0.4 mg, Sublingual, Q5 min PRN, Samtani,  Jai-Gurmukh, MD, 0.4 mg at 12/09/23 1253   oxyCODONE  (Oxy IR/ROXICODONE ) immediate release tablet 5 mg, 5 mg, Oral, Q6H PRN, Rathore, Vasundhra, MD, 5 mg at 12/08/23 1624   sodium chloride  flush (NS) 0.9 % injection 10-40 mL, 10-40 mL, Intracatheter, Q12H, Samtani, Jai-Gurmukh, MD, 10 mL at 12/09/23 0819   sodium chloride  flush (NS) 0.9 % injection 10-40 mL, 10-40 mL, Intracatheter, PRN, Samtani, Jai-Gurmukh, MD   traZODone  (DESYREL ) tablet 150 mg, 150 mg, Oral, QHS, Rathore, Vasundhra, MD, 150 mg at 12/08/23 2205  Labs and Diagnostic Imaging   Previous workup for small fiber neuropathy from June 2024: Anti-dsDNA 20         < 4 Anti Chromatin <1 AI <0.2   Anti Ribosomal P <1 AI <0.2   Anti SS-A <1 AI <0.2   Anti SS-B <1 AI <0.2   Anti Centromere B <1 AI <0.2   Anti-SM <1 AI <0.2   Anti-SM/RNP <1 AI <0.2   Anti-RNP <1 AI <0.2   Anti Scl-70 <1 AI <0.2   Anti Jo-1 <1 AI <0.2    Rheumatoid factor negative ANCA-negative SPEP was normal B1 was normal She had a skin biopsy that was positive for small fiber neuropathy  Imaging(Personally reviewed): MRI cervical spine-longitudinally extensive transverse myelitis, it is not homogenously enhancing but does enhance some.  Assessment   Patricia Patton is a 53 y.o. female with history of idiopathic small fiber neuropathy, presenting with  bilateral facial pain and blurred vision (which is improved) who was found to have longitudinally extensive transverse myelitis.  She has negative anti-Mog and anti-aqp4 antibodies, which makes neuromyelitis optica much less likely.  At this point, my suspicion that this is some type of autoimmune process is very high and is for this reason that she has been started on plasmapheresis.  Her chest pressure is atypical, not clearly cardiac in nature, but she is getting a workup per internal medicine.  I do not think I would avoid further plasmapheresis on the basis of this alone, unless she were to  have further problems with her next event.  Her LP shows mild inflammation as would be expected with an inflammatory myelitis.  Recommendations  Follow-up serum myelitis panel, [facility] test code MAS1 Follow-up CSF myelitis panel, [facility] test code MAC1 Follow-up ANCA titers, ENA panel(includes SSA, SSBA among others) Continue gabapentin  900 mg 3 times daily Continue carbamazepine  200 mg 3 times daily CT c/a/p PLEX Tx #2 today Neurology will continue to follow ______________________________________________________________________   Signed, Aisha Seals, MD [facility]

## 2023-12-09 NOTE — Progress Notes (Signed)
 [facility]   ROUNDING   NOTE Patricia Patton FMW:[id number]  DOB: Jun 16, 1970  DOA: 12/06/2023  PCP: Claudene Rayfield HERO, MD  12/09/2023,10:12 AM  LOS: 3 days    Code Status:  full     From:  home   53 year old white female Right sided trigeminal neuralgia with increasing frequency Comorbid anxiety depression ADHD OCD on Cymbalta , currently Xanax-discontinued Lamictal -not treated ADHD currently Recent treatment for shingles OSA intolerant of CPAP Burning paresthesias in upper and lower extremities?  Small fiber neuropathy?  Fibromyalgia last evaluated in person at [facility] neurology 08/16/2022 and referred to pain management additionally Referred to rheumatology--Cymbalta  was added for the neuropathy and comorbid depression   9/23 [facility] admission worsening right-sided facial bilateral shoulder pain paroxysmal becoming near constant-note was on 900 gabapentin  3 times daily Tegretol  100 3 times daily without benefit   Discharged to [facility] for Plex and placement of catheter and started on high-dose steroids  9/26 LP performed showing predominant lymphocytosis Gram stain   [-]-ESR CRP negative ANA is negative NMO IgG less than 1.5     Assessment  & Plan :    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 Autoimmune workup in process  Follow ANCA titers antiextractable nuclear antigen and other workup from [facility] as per neuro Follow oligoclonal band, IgG CSF pathology smear review Continues on Plex per neurology as well as Solu-Medrol  1000 Q24 Is intermittently weak does not feel as well today-defer to neurology plans Mood disorder Continue Cymbalta  60 trazodone  150--- 4 trigeminal neuralgia can continue Tegretol  200 3 times daily gabapentin  900 3 times daily Small fiber neuropathy Continue oxycodone , Cymbalta  for pain control and comorbid depression Impaired glucose tolerance Observe only for now    Data Reviewed:   Sodium 140 potassium 3.6 BUN/creatinine 16/0.8 WBC 10.8 hemoglobin 11 platelet 238 Viral  meningitis CSF negative  DVT prophylaxis: SCD  Status is: Inpatient Remains inpatient appropriate because:   Requires further neuro insights     Current Dispo: Hopefully home     Subjective:    Again a little bit more weak feels.today Little bit blah Had breakfast Main symptoms right now are tenderness over right hemicranium and face Her shoulder as well as her shoulder girdle weakness is pretty much gone  Objective + exam Vitals:   12/08/23 2336 12/09/23 0447 12/09/23 0451 12/09/23 0747  BP: (!) 135/92 133/86  133/86  Pulse: 88 84  81  Resp: 17 17  14   Temp: 98.2 F (36.8 C) 98.6 F (37 C)  97.7 F (36.5 C)  TempSrc: Oral Oral  Oral  SpO2: 97% 98%  97%  Weight:   60.8 kg   Height:       Filed Weights   12/07/23 0906 12/07/23 1458 12/09/23 0451  Weight: 60.1 kg 60.1 kg 60.8 kg     Examination:  Sensory intact bilaterally to upper extremities but deficits glove stocking distribution on right side specifically in lower extremities She has numbness over right cheek External ocular movements intact Tongue protrudes midline Abdomen soft S1-S2 no murmur Shoulder shrug bilaterally equal Power is 5/5 grossly Psych euthymic  Scheduled Meds:  calcium  carbonate  2 tablet Oral Q3H   carbamazepine   200 mg Oral TID   Chlorhexidine  Gluconate Cloth  6 each Topical Daily   DULoxetine   60 mg Oral Daily   gabapentin   900 mg Oral TID   sodium chloride  flush  10-40 mL Intracatheter Q12H   traZODone   150 mg Oral QHS   Continuous Infusions:  albumin  human 25 %  50 g in sodium chloride  0.9 %     albumin  human 25 % 50 g in sodium chloride  0.9 %     albumin  human 25 % 50 g in sodium chloride  0.9 %     anticoagulant sodium citrate     calcium  gluconate     citrate dextrose      methylPREDNISolone  sodium succinate (SOLU-MEDROL ) 1,000 mg in sodium chloride  0.9 % 50 mL IVPB 1,000 mg (12/08/23 0936)    Time 35  Jai-Gurmukh Soma Lizak, MD  [facility]

## 2023-12-09 NOTE — Plan of Care (Signed)

## 2023-12-09 NOTE — Plan of Care (Signed)

## 2023-12-09 NOTE — Progress Notes (Signed)
 Late entry for 9/27  Inform patient having chest pain at the end of Plex  Troponin dimer negative EKG to my over read no ST-T wave changes No effect of nitroglycerin on pain or heaviness in the chest Will trial GI cocktail-improved with therapies trialed   Discussed with family, nursing and has improved  > 25 minutes care coordination time

## 2023-12-09 NOTE — Procedures (Signed)
 TPE note:  Patient arrived on unit stating that she felt very fatigued, more so than yesterday.  She stated that she had no pain.  Patient experienced chest pain in the last 5 min of treatment.  MD notified, new orders received.  Med and labs carried out.  Patient left unit with same level of pain.  Patient stated that she did feel relieved that there were no cardiac issues found.  Hand off to patient's nurse.  Patient transported back to her room.  See orders and MAR for further details.

## 2023-12-10 DIAGNOSIS — R933 Abnormal findings on diagnostic imaging of other parts of digestive tract: Secondary | ICD-10-CM

## 2023-12-10 DIAGNOSIS — G373 Acute transverse myelitis in demyelinating disease of central nervous system: Secondary | ICD-10-CM

## 2023-12-10 DIAGNOSIS — G360 Neuromyelitis optica [Devic]: Secondary | ICD-10-CM | POA: Diagnosis not present

## 2023-12-10 DIAGNOSIS — R194 Change in bowel habit: Secondary | ICD-10-CM

## 2023-12-10 LAB — CBC WITH DIFFERENTIAL/PLATELET
Abs Immature Granulocytes: 0.09 K/uL — ABNORMAL HIGH (ref 0.00–0.07)
Basophils Absolute: 0 K/uL (ref 0.0–0.1)
Basophils Relative: 0 %
Eosinophils Absolute: 0 K/uL (ref 0.0–0.5)
Eosinophils Relative: 0 %
HCT: 35.1 % — ABNORMAL LOW (ref 36.0–46.0)
Hemoglobin: 12 g/dL (ref 12.0–15.0)
Immature Granulocytes: 1 %
Lymphocytes Relative: 13 %
Lymphs Abs: 1.1 K/uL (ref 0.7–4.0)
MCH: 29.9 pg (ref 26.0–34.0)
MCHC: 34.2 g/dL (ref 30.0–36.0)
MCV: 87.5 fL (ref 80.0–100.0)
Monocytes Absolute: 0.6 K/uL (ref 0.1–1.0)
Monocytes Relative: 7 %
Neutro Abs: 7 K/uL (ref 1.7–7.7)
Neutrophils Relative %: 79 %
Platelets: 203 K/uL (ref 150–400)
RBC: 4.01 MIL/uL (ref 3.87–5.11)
RDW: 11.9 % (ref 11.5–15.5)
WBC: 8.8 K/uL (ref 4.0–10.5)
nRBC: 0 % (ref 0.0–0.2)

## 2023-12-10 LAB — BASIC METABOLIC PANEL WITH GFR
Anion gap: 10 (ref 5–15)
BUN: 13 mg/dL (ref 6–20)
CO2: 25 mmol/L (ref 22–32)
Calcium: 8.8 mg/dL — ABNORMAL LOW (ref 8.9–10.3)
Chloride: 108 mmol/L (ref 98–111)
Creatinine, Ser: 0.73 mg/dL (ref 0.44–1.00)
GFR, Estimated: >60 mL/min (ref >60)
Glucose, Bld: 153 mg/dL — ABNORMAL HIGH (ref 70–99)
Potassium: 3.7 mmol/L (ref 3.5–5.1)
Sodium: 143 mmol/L (ref 135–145)

## 2023-12-10 LAB — PROTIME-INR
INR: 1.3 — ABNORMAL HIGH (ref 0.8–1.2)
Prothrombin Time: 17 s — ABNORMAL HIGH (ref 11.4–15.2)

## 2023-12-10 MED ORDER — CARBAMAZEPINE 100 MG PO CHEW
300.0000 mg | CHEWABLE_TABLET | Freq: Three times a day (TID) | ORAL | Status: DC
Start: 2023-12-10 — End: 2023-12-10
  Filled 2023-12-10 (×2): qty 3

## 2023-12-10 MED ORDER — CARBAMAZEPINE 100 MG PO CHEW
200.0000 mg | CHEWABLE_TABLET | Freq: Four times a day (QID) | ORAL | Status: DC
Start: 2023-12-10 — End: 2023-12-12
  Administered 2023-12-10 – 2023-12-12 (×8): 200 mg via ORAL
  Filled 2023-12-10 (×11): qty 2

## 2023-12-10 NOTE — Progress Notes (Addendum)
 [facility]   ROUNDING   NOTE Duchess Armendarez Mullendore FMW:[id number]  DOB: December 13, 1970  DOA: 12/06/2023  PCP: Claudene Rayfield HERO, MD  12/10/2023,11:34 AM  LOS: 4 days    Code Status:  full     From:  home   53 year old white female Right sided trigeminal neuralgia with increasing frequency Comorbid anxiety depression ADHD OCD on Cymbalta , currently Xanax-discontinued Lamictal -not treated ADHD currently Recent treatment for shingles OSA intolerant of CPAP Burning paresthesias in upper and lower extremities?  Small fiber neuropathy?  Fibromyalgia last evaluated in person at [facility] neurology 08/16/2022 and referred to pain management additionally Referred to rheumatology--Cymbalta  was added for the neuropathy and comorbid depression   9/23 [facility] admission worsening right-sided facial bilateral shoulder pain paroxysmal becoming near constant-note was on 900 gabapentin  3 times daily Tegretol  100 3 times daily without benefit   Discharged to [facility] for Plex and placement of catheter and started on high-dose steroids  9/26 LP performed showing predominant lymphocytosis Gram stain   [-]-ESR CRP negative ANA is negative NMO IgG less than 1.5 encephalitis panel negative antiextractable nuclear antigen   so Pending or oligoclonal bands IgG CSF ANCA titers     Assessment  & Plan :    Myelitis of some kind unclear what--antibodies negative Autoimmune workup in process-LP and constellation of symptoms suggestive of the same-given that she is improved on Plex with high-dose steroids this is the most likely diagnosis Differential could be malignancy as below Continues on Plex per neurology as well as Solu-Medrol  1000 Q24 Trigeminal neuralgia Some worsening symptoms on right hemicranium Increase Tegretol  to 300 3 times daily Stricture vs other in colon with severe constipation since her diagnosis of small fiber neuropathy Severe intermittent constipation since around 2024 with stooling once a week only Seen on CTA b  abd pelv  Prior CT ABD pelvis IV contrast 08/29/2023 at [facility] showed no bowel obstruction or inflammation Prior CT scan 02/23/2011 showed circumferential bowel thickening in the terminal ileum?  Crohn's at the time and this is when she was age 19 Tells me she had a colonoscopy but the only record I see is in 2012 and it is a report in Care Everywhere that I am not able to read Given her constellation of autoimmune processes and the chance of this being a paraneoplastic syndrome I think it is integral to get GI involved and start thinking about a colonoscopy and I will consult them today Mood disorder Continue Cymbalta  60 trazodone  150---  gabapentin  900 3 times daily still being given but ineffective Small fiber neuropathy Continue oxycodone , Cymbalta  for pain control and comorbid depression Impaired glucose tolerance Observe only for now    Data Reviewed:   Sodium 140 potassium 3.6 BUN/creatinine 16/0.8 WBC 10.8 hemoglobin 11 platelet 238 Viral meningitis CSF negative  DVT prophylaxis: SCD  Status is: Inpatient Remains inpatient appropriate because:   Requires further neuro insights     Current Dispo: Hopefully home     Subjective:    Overall a little stronger no pain no fever other than in the right face and hemicranium No visual disturbances nose daytime air Seems stronger in upper and lower extremities Large stool today which seemed a little dark and bloody however hemoglobin is stable She tells me she has been constipated since her diagnosis   Objective + exam Vitals:   12/09/23 2046 12/09/23 2340 12/10/23 0434 12/10/23 0828  BP: 105/74 119/77 110/73 108/70  Pulse: 95 81 80 86  Resp: 18 14 16  18  Temp: 98.6 F (37 C) 98.2 F (36.8 C) 97.9 F (36.6 C) 98.1 F (36.7 C)  TempSrc: Oral Oral Oral Oral  SpO2: 94% 96% 93% 95%  Weight:      Height:       Filed Weights   12/07/23 1458 12/09/23 0451 12/09/23 1101  Weight: 60.1 kg 60.8 kg 60.8 kg      Examination:  Sensory intact bilaterally  She has numbness over right cheek with continued pain below right supraorbital ridge Abdomen soft S1-S2 no murmur Shoulder shrug bilaterally equal Abdomen is soft no rebound Power is 5/5 grossly Psych euthymic  Scheduled Meds:  carbamazepine   300 mg Oral TID   Chlorhexidine  Gluconate Cloth  6 each Topical Daily   DULoxetine   60 mg Oral Daily   gabapentin   900 mg Oral TID   sodium chloride  flush  10-40 mL Intracatheter Q12H   traZODone   150 mg Oral QHS   Continuous Infusions:  anticoagulant sodium citrate      Time 95  Jai-Gurmukh Jeferson Boozer, MD  [facility]

## 2023-12-10 NOTE — Consult Note (Signed)
 Consultation  Referring Provider: [facility]/Samtani Primary Care Physician:  Claudene Rayfield HERO, MD Primary Gastroenterologist: Sampson  Reason for Consultation: Abnormal CT abdomen pelvis concerning for a ascending colon mass  HPI: Patricia Patton is a 53 y.o. female with history of chronic anxiety, depression who presented with recently diagnosed trigeminal neuralgia with worsening headache, left-sided face pain and bilateral shoulder pain.  Apparently symptoms initially started about 2 months ago and she was initially given a diagnosis of shingles and treated with Valtrex then increasing doses of gabapentin  however symptoms did not improve and then her headache worsened. He has been seen by neurology here.  MRI of the brain showed no significant intracranial abnormalities but did show increased T2 flair signal within the spinal cord extending from the cervical medullary junction to the mid body of C6 and abnormal enhancement posterior laterally on the right at the cervical medullary junction and posterior laterally on the left at C1 and C2 vertebral body levels consistent with active demyelinization. She was started on IV Solu-Medrol , increased doses of carbamazepine  and increased doses of gabapentin  Further MRI of orbits showed persistent abnormal enhancement of the spinal cord on the right at the cervical medullary junction and on the left at C1-C2 levels consistent with demyelinating disease. She has now been started on a course of plasmapheresis.  There is plan for 10 sessions of plasmapheresis with next session tomorrow. Current suspicion is for an autoimmune process  As part of her workup he also underwent CT abdomen and pelvis and chest-it showed a short segment ascending colon irregular bowel wall thickening concerning for malignancy, and otherwise negative study.  Patient relates that she has had prior colonoscopy through either [facility] or [facility] and was told she would require a  5-year interval follow-up.  She says she is overdue for that.  She has noticed a change in her bowel habits over the past year now having a bowel movement usually about once a week and usually on Sundays.  Prior to that she might have a couple of bowel movements per week that has never been regular on a daily basis.  She has not noticed any melena or hematochezia until today where she had a very hard large bowel movement and did see some fresh blood on the tissue.  She has no complaints of abdominal pain.  Labs today-BBC 8.8/hemoglobin 12/hematocrit 35.1/MCV 87.5/platelets 203 Pro time 17/INR 1.3 Potassium 3.7/BUN 13/creatinine 0.73   Past Medical History:  Diagnosis Date   Anxiety    Bell's palsy    Strep throat 2018    Past Surgical History:  Procedure Laterality Date   CESAREAN SECTION     FOOT SURGERY Bilateral    IR TUNNELED CENTRAL VENOUS CATH PLC W IMG  12/07/2023   TONSILLECTOMY      Prior to Admission medications   Medication Sig Start Date End Date Taking? Authorizing Provider  carbamazepine  (TEGRETOL ) 100 MG chewable tablet Chew 100 mg by mouth 3 (three) times daily. 11/28/23 12/28/23 Yes [provider]  DULoxetine  (CYMBALTA ) 60 MG capsule Take 60 mg by mouth daily. 11/08/23 11/07/24 Yes [provider]  gabapentin  (NEURONTIN ) 300 MG capsule Take 300 mg by mouth 3 (three) times daily. 05/25/23  Yes [provider]  meloxicam (MOBIC) 15 MG tablet Take 15 mg by mouth daily.   Yes [provider]  oxyCODONE -acetaminophen  (PERCOCET/ROXICET) 5-325 MG tablet Take 1 tablet by mouth every 8 (eight) hours as needed for severe pain (pain score 7-10) or moderate  pain (pain score 4-6).   Yes [provider]  traZODone  (DESYREL ) 150 MG tablet Take 150 mg by mouth at bedtime. 11/08/23 11/07/24 Yes [provider]  valACYclovir (VALTREX) 1000 MG tablet Take 1,000 mg by mouth 3 (three) times daily.   Yes [provider]   methylPREDNISolone  sodium succinate 1,000 mg in sodium chloride  0.9 % 50 mL Inject 1,000 mg into the vein daily. 12/07/23   Awanda City, MD  oxyCODONE  (OXY IR/ROXICODONE ) 5 MG immediate release tablet Take 5 mg by mouth every 6 (six) hours as needed. Patient not taking: Reported on 12/05/2023 10/13/23   [provider]  pantoprazole  (PROTONIX ) 40 MG injection Inject 40 mg into the vein daily. 12/07/23   Awanda City, MD    Current Facility-Administered Medications  Medication Dose Route Frequency Provider Last Rate Last Admin   acetaminophen  (TYLENOL ) tablet 650 mg  650 mg Oral Q6H PRN Alfornia Madison, MD   650 mg at 12/10/23 1039   anticoagulant sodium citrate solution 5 mL  5 mL Intracatheter PRN Alfornia Madison, MD       carbamazepine  (TEGRETOL ) chewable tablet 200 mg  200 mg Oral QID Michaela Aisha SQUIBB, MD   200 mg at 12/10/23 1500   Chlorhexidine  Gluconate Cloth 2 % PADS 6 each  6 each Topical Daily Samtani, Jai-Gurmukh, MD   6 each at 12/10/23 1501   DULoxetine  (CYMBALTA ) DR capsule 60 mg  60 mg Oral Daily Rathore, Vasundhra, MD   60 mg at 12/10/23 1038   gabapentin  (NEURONTIN ) capsule 900 mg  900 mg Oral TID Alfornia Madison, MD   900 mg at 12/10/23 1522   naloxone  (NARCAN ) injection 0.4 mg  0.4 mg Intravenous PRN Rathore, Vasundhra, MD       nitroGLYCERIN (NITROSTAT) SL tablet 0.4 mg  0.4 mg Sublingual Q5 min PRN Samtani, Jai-Gurmukh, MD   0.4 mg at 12/09/23 1253   oxyCODONE  (Oxy IR/ROXICODONE ) immediate release tablet 5 mg  5 mg Oral Q6H PRN Alfornia Madison, MD   5 mg at 12/10/23 1525   sodium chloride  flush (NS) 0.9 % injection 10-40 mL  10-40 mL Intracatheter Q12H Samtani, Jai-Gurmukh, MD   10 mL at 12/10/23 1040   sodium chloride  flush (NS) 0.9 % injection 10-40 mL  10-40 mL Intracatheter PRN Samtani, Jai-Gurmukh, MD       traZODone  (DESYREL ) tablet 150 mg  150 mg Oral QHS Rathore, Vasundhra, MD   150 mg at 12/10/23 0113    Allergies as of 12/06/2023 - Reviewed  12/06/2023  Allergen Reaction Noted   Amoxicillin-pot clavulanate Hives and Swelling 01/11/2013    Family History  Problem Relation Age of Onset   Diabetes Mother    Diabetes Father     Social History   Socioeconomic History   Marital status: Married    Spouse name: Not on file   Number of children: Not on file   Years of education: Not on file   Highest education level: Not on file  Occupational History   Not on file  Tobacco Use   Smoking status: Former   Smokeless tobacco: Never  Substance and Sexual Activity   Alcohol use: Yes    Comment: occasional    Drug use: Yes    Types: Marijuana   Sexual activity: Not on file  Other Topics Concern   Not on file  Social History Narrative   Not on file   Social Drivers of Health   Financial Resource Strain: Low Risk  (07/30/2023)  Received from [facility]   Overall Financial Resource Strain (CARDIA)    Difficulty of Paying Living Expenses: Not hard at all  Food Insecurity: No Food Insecurity (12/06/2023)   Hunger Vital Sign    Worried About Running Out of Food in the Last Year: Never true    Ran Out of Food in the Last Year: Never true  Transportation Needs: No Transportation Needs (12/06/2023)   PRAPARE - Administrator, Civil Service (Medical): No    Lack of Transportation (Non-Medical): No  Physical Activity: Insufficiently Active (08/13/2018)   Received from [facility]   Exercise Vital Sign    On average, how many days per week do you engage in moderate to strenuous exercise (like a brisk walk)?: 4 days    On average, how many minutes do you engage in exercise at this level?: 10 min  Stress: Not on file  Social Connections: Unknown (08/13/2018)   Received from [facility]   Social Connection and Isolation Panel    In a typical week, how many times do you talk on the phone with family, friends, or neighbors?: Three times a week    How often do you get together with friends or  relatives?: Three times a week    Attends Religious Services: Not on file    Do you belong to any clubs or organizations such as church groups, unions, fraternal or athletic groups, or school groups?: No    How often do you attend meetings of the clubs or organizations you belong to?: Never    Are you married, widowed, divorced, separated, never married, or living with a partner?: Married  Intimate Partner Violence: Not At Risk (12/06/2023)   Humiliation, Afraid, Rape, and Kick questionnaire    Fear of Current or Ex-Partner: No    Emotionally Abused: No    Physically Abused: No    Sexually Abused: No    Review of Systems: Pertinent positive and negative review of systems were noted in the above HPI section.  All other review of systems was otherwise negative.  Physical Exam: Vital signs in last 24 hours: Temp:  [97.9 F (36.6 C)-98.6 F (37 C)] 98.1 F (36.7 C) (09/28 1651) Pulse Rate:  [80-96] 96 (09/28 1651) Resp:  [14-18] 18 (09/28 1651) BP: (105-119)/(65-77) 111/65 (09/28 1651) SpO2:  [93 %-98 %] 98 % (09/28 1651) Last BM Date : 12/04/23 General:   Alert,  Well-developed, well-nourished, older white female pleasant and cooperative in NAD.  Somewhat uncomfortable due to facial pain Head:  Normocephalic and atraumatic. Eyes:  Sclera clear, no icterus.   Conjunctiva pink. Ears:  Normal auditory acuity. Nose:  No deformity, discharge,  or lesions. Mouth:  No deformity or lesions.   Neck:  Supple; no masses or thyromegaly. Lungs:  Clear throughout to auscultation.   No wheezes, crackles, or rhonchi.  Heart:  Regular rate and rhythm; no murmurs, clicks, rubs,  or gallops. Abdomen:  Soft,nontender, BS active,nonpalp mass or hsm.   Rectal: Not done Msk:  Symmetrical without gross deformities. . Pulses:  Normal pulses noted. Extremities:  Without clubbing or edema. Neurologic:  Alert and  oriented x4;  grossly normal neurologically. Skin:  Intact without significant lesions or  rashes.. Psych:  Alert and cooperative. Normal mood and affect.  Intake/Output from previous day: No intake/output data recorded. Intake/Output this shift: No intake/output data recorded.  Lab Results: Recent Labs    12/09/23 0435 12/10/23 0515  WBC 10.8* 8.8  HGB 11.9* 12.0  HCT 34.9* 35.1*  PLT 238 203   BMET Recent Labs    12/09/23 0435 12/10/23 0515  NA 140 143  K 3.6 3.7  CL 102 108  CO2 27 25  GLUCOSE 115* 153*  BUN 16 13  CREATININE 0.83 0.73  CALCIUM  8.8* 8.8*   LFT No results for input(s): PROT, ALBUMIN , AST, ALT, ALKPHOS, BILITOT, BILIDIR, IBILI in the last 72 hours. PT/INR Recent Labs    12/08/23 1005 12/10/23 1347  LABPROT 16.4* 17.0*  INR 1.3* 1.3*     IMPRESSION:  #91 53 year old white female admitted 3 days ago with progressive facial pain now bilateral, blurred vision and shoulder pain.  She has been diagnosed with a longitudinally extensive transverse myelitis.  Neurology is following and suspicion is for an autoimmune process at this time. She is undergoing a series of plasmapheresis session #3 planned for tomorrow She has been started on IV Solu-Medrol , and is requiring higher doses of gabapentin   #2 CT imaging of the abdomen and pelvis has shown an abnormal area in the ascending colon concerning for neoplasm. Patient has had prior colonoscopy she says the last was 5 to 6 years ago and she was told to have a 5-year interval follow-up.  She is uncertain about polyps.  She believes this was done through [facility] or [facility]  She has no current complaints of abdominal pain but has noticed a change in bowel habits over the past year now usually only having a bowel movement about once per week. Did see some blood on the tissue today but that was after a very hard large stool.  Will need to rule out colonic malignancy  #3 anxiety/depression   PLAN:  Leave on regular diet for now Patient will need a colonoscopy during this  admission. She is planned to have 5 sessions of plasmapheresis total over 10 days.  We discussed CT findings and indication for colonoscopy with the patient today.  She is in agreement with having colonoscopy during this admission.  We discussed indications risks and benefits, and again in agreement to proceed.  GI will follow-up tomorrow, timing of colonoscopy to be determined.  As she is undergoing plasmapheresis she will be here for several more days and we may aim to do this more towards the end of her plasmapheresis sessions.  GI will follow with you   Leevon Upperman EsterwoodPA-C  12/10/2023, 4:53 PM

## 2023-12-10 NOTE — Progress Notes (Signed)
 NEUROLOGY CONSULT FOLLOW UP NOTE   Date of service: December 10, 2023 Patient Name: Patricia Patton MRN:  [id number] DOB:  [birth year]  Interval Hx/subjective   No further chest discomfort, D-dimer was negative and cardiac workup was also negative.  CT c/a/p with area of possible concern in the ascending colon.   Vitals   Vitals:   12/09/23 2046 12/09/23 2340 12/10/23 0434 12/10/23 0828  BP: 105/74 119/77 110/73 108/70  Pulse: 95 81 80 86  Resp: 18 14 16 18   Temp: 98.6 F (37 C) 98.2 F (36.8 C) 97.9 F (36.6 C) 98.1 F (36.7 C)  TempSrc: Oral Oral Oral Oral  SpO2: 94% 96% 93% 95%  Weight:      Height:         Body mass index is 24.52 kg/m.  Physical Exam   Constitutional: Appears well-developed and well-nourished.  Neurologic Examination    MS: Awake, alert, interactive and appropriate, oriented x 3 CN: Pupils reactive bilaterally, EOMI  Motor: Her left hemiparesis, which was mild to begin with, does appear improved compared to the previous days. Sensory: Diminished on the right compared to left, unchanged  Medications  Current Facility-Administered Medications:    acetaminophen  (TYLENOL ) tablet 650 mg, 650 mg, Oral, Q6H PRN, Rathore, Vasundhra, MD, 650 mg at 12/10/23 1039   anticoagulant sodium citrate solution 5 mL, 5 mL, Intracatheter, PRN, Rathore, Vasundhra, MD   carbamazepine  (TEGRETOL ) chewable tablet 300 mg, 300 mg, Oral, TID, Samtani, Jai-Gurmukh, MD   Chlorhexidine  Gluconate Cloth 2 % PADS 6 each, 6 each, Topical, Daily, Samtani, Jai-Gurmukh, MD, 6 each at 12/09/23 1529   DULoxetine  (CYMBALTA ) DR capsule 60 mg, 60 mg, Oral, Daily, Rathore, Vasundhra, MD, 60 mg at 12/10/23 1038   gabapentin  (NEURONTIN ) capsule 900 mg, 900 mg, Oral, TID, Rathore, Vasundhra, MD, 900 mg at 12/10/23 1038   naloxone  (NARCAN ) injection 0.4 mg, 0.4 mg, Intravenous, PRN, Rathore, Vasundhra, MD   nitroGLYCERIN (NITROSTAT) SL tablet 0.4 mg, 0.4 mg, Sublingual, Q5 min PRN,  Samtani, Jai-Gurmukh, MD, 0.4 mg at 12/09/23 1253   oxyCODONE  (Oxy IR/ROXICODONE ) immediate release tablet 5 mg, 5 mg, Oral, Q6H PRN, Rathore, Vasundhra, MD, 5 mg at 12/09/23 2151   sodium chloride  flush (NS) 0.9 % injection 10-40 mL, 10-40 mL, Intracatheter, Q12H, Samtani, Jai-Gurmukh, MD, 10 mL at 12/10/23 1040   sodium chloride  flush (NS) 0.9 % injection 10-40 mL, 10-40 mL, Intracatheter, PRN, Samtani, Jai-Gurmukh, MD   traZODone  (DESYREL ) tablet 150 mg, 150 mg, Oral, QHS, Rathore, Vasundhra, MD, 150 mg at 12/10/23 0113  Labs and Diagnostic Imaging   Previous workup for small fiber neuropathy from June 2024: Anti-dsDNA 20         < 4 Anti Chromatin <1 AI <0.2   Anti Ribosomal P <1 AI <0.2   Anti SS-A <1 AI <0.2   Anti SS-B <1 AI <0.2   Anti Centromere B <1 AI <0.2   Anti-SM <1 AI <0.2   Anti-SM/RNP <1 AI <0.2   Anti-RNP <1 AI <0.2   Anti Scl-70 <1 AI <0.2   Anti Jo-1 <1 AI <0.2    Rheumatoid factor negative ANCA-negative SPEP was normal B1 was normal She had a skin biopsy that was positive for small fiber neuropathy  Imaging(Personally reviewed): MRI cervical spine-longitudinally extensive transverse myelitis, it is not homogenously enhancing but does enhance some.  Assessment   Patricia Patton is a 53 y.o. female with history of idiopathic small fiber neuropathy, presenting with bilateral facial pain and blurred  vision (which is improved) who was found to have longitudinally extensive transverse myelitis.  She has negative anti-Mog and anti-aqp4 antibodies, which makes neuromyelitis optica much less likely.  At this point, my suspicion that this is some type of autoimmune process is very high and is for this reason that she has been started on plasmapheresis.  Her chest pressure was atypical, and with a negative D-dimer and cardiac workup, low suspicion  Her LP shows mild inflammation as would be expected with an inflammatory myelitis.   CT chest abdomen  pelvis looking for possible paraneoplastic association does show an area of concern in the ascending colon, I would favor getting GI involvement.  Recommendations  Follow-up serum myelitis panel, Mayo test code MAS1 Follow-up CSF myelitis panel, [facility] test code MAC1 Follow-up ANCA titers, ENA panel(includes SSA, SSBA among others) Continue gabapentin  900 mg 3 times daily Increase carbamazepine  to 200 mg 4 times daily Agree with GI consultation PLEX Tx #3 tomorrow Neurology will continue to follow ______________________________________________________________________   Signed, Aisha Seals, MD [facility]

## 2023-12-11 ENCOUNTER — Encounter (HOSPITAL_COMMUNITY): Payer: Self-pay | Admitting: Internal Medicine

## 2023-12-11 DIAGNOSIS — R933 Abnormal findings on diagnostic imaging of other parts of digestive tract: Secondary | ICD-10-CM

## 2023-12-11 LAB — ANCA TITERS
Atypical P-ANCA titer: <1:20 {titer}
C-ANCA: <1:20 {titer}
P-ANCA: <1:20 {titer}

## 2023-12-11 LAB — CBC WITH DIFFERENTIAL/PLATELET
Abs Immature Granulocytes: 0.09 K/uL — ABNORMAL HIGH (ref 0.00–0.07)
Basophils Absolute: 0 K/uL (ref 0.0–0.1)
Basophils Relative: 0 %
Eosinophils Absolute: 0.1 K/uL (ref 0.0–0.5)
Eosinophils Relative: 1 %
HCT: 34.6 % — ABNORMAL LOW (ref 36.0–46.0)
Hemoglobin: 11.7 g/dL — ABNORMAL LOW (ref 12.0–15.0)
Immature Granulocytes: 1 %
Lymphocytes Relative: 40 %
Lymphs Abs: 3.7 K/uL (ref 0.7–4.0)
MCH: 30.2 pg (ref 26.0–34.0)
MCHC: 33.8 g/dL (ref 30.0–36.0)
MCV: 89.2 fL (ref 80.0–100.0)
Monocytes Absolute: 0.7 K/uL (ref 0.1–1.0)
Monocytes Relative: 8 %
Neutro Abs: 4.7 K/uL (ref 1.7–7.7)
Neutrophils Relative %: 50 %
Platelets: 148 K/uL — ABNORMAL LOW (ref 150–400)
RBC: 3.88 MIL/uL (ref 3.87–5.11)
RDW: 12.1 % (ref 11.5–15.5)
WBC: 9.3 K/uL (ref 4.0–10.5)
nRBC: 0 % (ref 0.0–0.2)

## 2023-12-11 LAB — BASIC METABOLIC PANEL WITH GFR
Anion gap: 8 (ref 5–15)
BUN: 14 mg/dL (ref 6–20)
CO2: 27 mmol/L (ref 22–32)
Calcium: 8.2 mg/dL — ABNORMAL LOW (ref 8.9–10.3)
Chloride: 106 mmol/L (ref 98–111)
Creatinine, Ser: 0.75 mg/dL (ref 0.44–1.00)
GFR, Estimated: >60 mL/min (ref >60)
Glucose, Bld: 86 mg/dL (ref 70–99)
Potassium: 3.1 mmol/L — ABNORMAL LOW (ref 3.5–5.1)
Sodium: 141 mmol/L (ref 135–145)

## 2023-12-11 LAB — IGG CSF INDEX
Albumin CSF-mCnc: 20 mg/dL (ref 8–37)
Albumin: 4.3 g/dL (ref 3.8–4.9)
CSF IgG Index: 1.2 — ABNORMAL HIGH (ref 0.0–0.7)
IgG (Immunoglobin G), Serum: 472 mg/dL — ABNORMAL LOW (ref 586–1602)
IgG, CSF: 2.7 mg/dL (ref 0.0–6.7)
IgG/Alb Ratio, CSF: 0.14 (ref 0.00–0.25)

## 2023-12-11 LAB — CSF CULTURE W GRAM STAIN: Culture: NO GROWTH

## 2023-12-11 LAB — MISC LABCORP TEST (SEND OUT)

## 2023-12-11 LAB — PATHOLOGIST SMEAR REVIEW

## 2023-12-11 MED ORDER — CALCIUM CARBONATE ANTACID 500 MG PO CHEW
2.0000 | CHEWABLE_TABLET | Freq: Once | ORAL | Status: AC
  Administered 2023-12-11: 400 mg via ORAL
  Filled 2023-12-11: qty 2

## 2023-12-11 MED ORDER — POTASSIUM CHLORIDE CRYS ER 20 MEQ PO TBCR
40.0000 meq | EXTENDED_RELEASE_TABLET | Freq: Every day | ORAL | Status: DC
  Administered 2023-12-11 – 2023-12-12 (×2): 40 meq via ORAL
  Filled 2023-12-11 (×2): qty 2

## 2023-12-11 MED ORDER — ACD FORMULA A 0.73-2.45-2.2 GM/100ML VI SOLN
1000.0000 mL | Status: DC
Start: 2023-12-11 — End: 2023-12-11
  Administered 2023-12-11: 1000 mL
  Filled 2023-12-11: qty 1000

## 2023-12-11 MED ORDER — SIMETHICONE 80 MG PO CHEW
240.0000 mg | CHEWABLE_TABLET | Freq: Once | ORAL | Status: AC
  Administered 2023-12-11: 240 mg via ORAL
  Filled 2023-12-11: qty 3

## 2023-12-11 MED ORDER — ONDANSETRON HCL 4 MG/2ML IJ SOLN
4.0000 mg | Freq: Once | INTRAMUSCULAR | Status: DC
  Filled 2023-12-11: qty 2

## 2023-12-11 MED ORDER — NA SULFATE-K SULFATE-MG SULF 17.5-3.13-1.6 GM/177ML PO SOLN
0.5000 | Freq: Once | ORAL | Status: AC
Start: 2023-12-11 — End: 2023-12-11
  Administered 2023-12-11: 177 mL via ORAL

## 2023-12-11 MED ORDER — CALCIUM GLUCONATE-NACL 2-0.675 GM/100ML-% IV SOLN
INTRAVENOUS | Status: AC
  Filled 2023-12-11: qty 100

## 2023-12-11 MED ORDER — HYDROMORPHONE HCL 2 MG PO TABS: 1.0000 mg | ORAL_TABLET | ORAL | Status: DC | PRN

## 2023-12-11 MED ORDER — CALCIUM GLUCONATE-NACL 2-0.675 GM/100ML-% IV SOLN
2.0000 g | Freq: Once | INTRAVENOUS | Status: AC
  Administered 2023-12-11: 2000 mg via INTRAVENOUS

## 2023-12-11 MED ORDER — SODIUM CHLORIDE 0.9 % IV SOLN: INTRAVENOUS | Status: DC

## 2023-12-11 MED ORDER — DIPHENHYDRAMINE HCL 25 MG PO CAPS
25.0000 mg | ORAL_CAPSULE | Freq: Four times a day (QID) | ORAL | Status: DC | PRN
  Administered 2023-12-11: 25 mg via ORAL
  Filled 2023-12-11: qty 1

## 2023-12-11 MED ORDER — OXYCODONE HCL 5 MG PO TABS: 5.0000 mg | ORAL_TABLET | ORAL | Status: DC | PRN

## 2023-12-11 MED ORDER — HYDROMORPHONE HCL 2 MG PO TABS
1.0000 mg | ORAL_TABLET | ORAL | Status: DC | PRN
Start: 2023-12-11 — End: 2023-12-12

## 2023-12-11 MED ORDER — MORPHINE SULFATE (PF) 2 MG/ML IV SOLN
1.0000 mg | INTRAVENOUS | Status: DC | PRN
Start: 2023-12-11 — End: 2023-12-12
  Administered 2023-12-11 – 2023-12-12 (×6): 1 mg via INTRAVENOUS
  Filled 2023-12-11 (×6): qty 1

## 2023-12-11 MED ORDER — SODIUM CHLORIDE 0.9 % IV SOLN
INTRAVENOUS | Status: AC
  Filled 2023-12-11 (×3): qty 200

## 2023-12-11 MED ORDER — ANTICOAGULANT SODIUM CITRATE 4% (200MG/5ML) IV SOLN
5.0000 mL | Freq: Once | Status: AC
  Administered 2023-12-11: 2.4 mL
  Filled 2023-12-11: qty 2.4

## 2023-12-11 MED ORDER — NA SULFATE-K SULFATE-MG SULF 17.5-3.13-1.6 GM/177ML PO SOLN
0.5000 | Freq: Once | ORAL | Status: AC
  Administered 2023-12-11: 177 mL via ORAL
  Filled 2023-12-11: qty 1

## 2023-12-11 MED ORDER — ACETAMINOPHEN 325 MG PO TABS
650.0000 mg | ORAL_TABLET | ORAL | Status: DC | PRN
  Administered 2023-12-11: 650 mg via ORAL
  Filled 2023-12-11: qty 2

## 2023-12-11 NOTE — Progress Notes (Addendum)
 Daily Progress Note  DOA: 12/06/2023 Hospital Day: 6  Cc:  Abnormal CT scan concerning for colon mass  ASSESSMENT    53 yo female admitted with bilateral facial pain , blurred vision, chest discomfort. 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 Neurology following. Concern is for autoimmune process.  Undergoing plasmapheresis.   Abnormal colon on CT scan Recent bowel changes (decreased frequency) CT AP with contrast showing a short segment ascending colon irregular bowel wall thickening concerning for malignancy.    Principal Problem:   Neuromyelitis optica spectrum disorder [employer]) Active Problems:   Anxiety and depression    PLAN   --Patient would like to proceed with colonoscopy tomorrow. She tells me that she and Neurology discussed this earlier today. We have also reached out to Neurology. If they don't have concerns about sedation  / procedure from a Neurologic standpoint then will proceed with colonoscopy tomorrow. The risks and benefits of colonoscopy with possible polypectomy / biopsies were discussed and the patient agrees to proceed. She has been on clear liquids today     Subjective   No physical complaints. On clear liquids only. Hoping for colonoscopy tomorrow.    Objective    Recent Labs    12/09/23 0435 12/10/23 0515 12/11/23 0443  WBC 10.8* 8.8 9.3  HGB 11.9* 12.0 11.7*  HCT 34.9* 35.1* 34.6*  MCV 87.5 87.5 89.2  PLT 238 203 148*   No results for input(s): FOLATE, VITAMINB12, FERRITIN, TIBC, IRONPCTSAT in the last 72 hours. Recent Labs    12/09/23 0435 12/10/23 0515 12/11/23 0443  NA 140 143 141  K 3.6 3.7 3.1*  CL 102 108 106  CO2 27 25 27   GLUCOSE 115* 153* 86  BUN 16 13 14   CREATININE 0.83 0.73 0.75  CALCIUM  8.8* 8.8* 8.2*    Imaging:  CT CHEST ABDOMEN PELVIS W CONTRAST CLINICAL DATA:  Occult malignancy.  Possible neoplasm of the brain.  EXAM: CT CHEST, ABDOMEN, AND PELVIS WITH  CONTRAST  TECHNIQUE: Multidetector CT imaging of the chest, abdomen and pelvis was performed following the standard protocol during bolus administration of intravenous contrast.  RADIATION DOSE REDUCTION: This exam was performed according to the departmental dose-optimization program which includes automated exposure control, adjustment of the mA and/or kV according to patient size and/or use of iterative reconstruction technique.  CONTRAST:  75mL OMNIPAQUE IOHEXOL 350 MG/ML SOLN  COMPARISON:  None Available.  FINDINGS: CT CHEST FINDINGS  Cardiovascular: Partially visualized right chest wall dialysis catheter with tip at the superior cavoatrial junction. Normal heart size. No significant pericardial effusion. The thoracic aorta is normal in caliber. No atherosclerotic plaque of the thoracic aorta. No coronary artery calcifications.  Mediastinum/Nodes: No enlarged mediastinal, hilar, or axillary lymph nodes. Thyroid gland, trachea, and esophagus demonstrate no significant findings.  Lungs/Pleura: No focal consolidation. No pulmonary nodule. No pulmonary mass. No pleural effusion. No pneumothorax.  Musculoskeletal:  No chest wall abnormality.  No suspicious lytic or blastic osseous lesions. No acute displaced fracture.  CT ABDOMEN PELVIS FINDINGS  Hepatobiliary: No focal liver abnormality. No gallstones, gallbladder wall thickening, or pericholecystic fluid. No biliary dilatation.  Pancreas: No focal lesion. Normal pancreatic contour. No surrounding inflammatory changes. No main pancreatic ductal dilatation.  Spleen: Normal in size without focal abnormality.  Adrenals/Urinary Tract:  No adrenal nodule bilaterally.  Bilateral kidneys enhance symmetrically.  No hydronephrosis. No hydroureter.  The urinary bladder is unremarkable.  On delayed imaging, there is no urothelial wall thickening and there are no  filling defects in the opacified portions of the  bilateral collecting systems or ureters.  Stomach/Bowel: Stomach is within normal limits. No evidence of small bowel wall thickening or dilatation. Short segment ascending colon irregular bowel wall thickening with applee-core appearing lesion (6:52, 7:22). Stool throughout the majority of the colon. Appendix appears normal.  Vascular/Lymphatic: No abdominal aorta or iliac aneurysm. No abdominal, pelvic, or inguinal lymphadenopathy.  Reproductive: Uterus and bilateral adnexa are unremarkable.  Other: No intraperitoneal free fluid. No intraperitoneal free gas. No organized fluid collection.  Musculoskeletal:  No abdominal wall hernia or abnormality.  No suspicious lytic or blastic osseous lesions. No acute displaced fracture.  IMPRESSION: 1. Short segment ascending colon irregular bowel wall thickening concerning for malignancy. Recommend further evaluation with colonoscopy. 2. No acute intrathoracic or intrapelvic abnormality.  Electronically Signed   By: Morgane  Naveau M.D.   On: 12/09/2023 20:23     Scheduled inpatient medications:   calcium  carbonate  2 tablet Oral Once in dialysis   carbamazepine   200 mg Oral QID   Chlorhexidine  Gluconate Cloth  6 each Topical Daily   DULoxetine   60 mg Oral Daily   gabapentin   900 mg Oral TID   potassium chloride  40 mEq Oral Daily   sodium chloride  flush  10-40 mL Intracatheter Q12H   traZODone   150 mg Oral QHS   Continuous inpatient infusions:   albumin  human 25 % 50 g in sodium chloride  0.9 %     anticoagulant sodium citrate     anticoagulant sodium citrate     calcium  gluconate     citrate dextrose      PRN inpatient medications: acetaminophen , acetaminophen , anticoagulant sodium citrate, diphenhydrAMINE , morphine injection, naLOXone  (NARCAN )  injection, nitroGLYCERIN, oxyCODONE , sodium chloride  flush  Vital signs in last 24 hours: Temp:  [97.6 F (36.4 C)-98.4 F (36.9 C)] 97.6 F (36.4 C) (09/29 1211) Pulse Rate:   [78-96] 78 (09/29 1211) Resp:  [16-18] 16 (09/29 0755) BP: (93-112)/(61-78) 112/62 (09/29 1211) SpO2:  [94 %-98 %] 96 % (09/29 1211) Weight:  [62.6 kg] 62.6 kg (09/29 1016) Last BM Date : 12/04/23  Intake/Output Summary (Last 24 hours) at 12/11/2023 1331 Last data filed at 12/11/2023 1019 Gross per 24 hour  Intake 10 ml  Output --  Net 10 ml    Intake/Output from previous day: No intake/output data recorded. Intake/Output this shift: Total I/O In: 10 [I.V.:10] Out: -    Physical Exam:  General: Alert female in NAD Heart:  Regular rate and rhythm.  Pulmonary: Normal respiratory effort Abdomen: Soft, nondistended, nontender. Normal bowel sounds. Extremities: No lower extremity edema  Neurologic: Alert and oriented Psych: Pleasant. Cooperative     LOS: 5 days   Vina Dasen ,NP 12/11/2023, 1:31 PM    Attending physician's note   I have reviewed the chart and discussed her care on rounds. I performed a substantive portion of this encounter, including complete performance of at least one of the key components, in conjunction with the APP. I agree with the APP's note, impression, and recommendations with my edits.   CT reviewed and suspicious for apple core lesion in the ascending colon. Patient currently at TPE procedure #3.  Discussed with consulting Neurology service and will coordinate for colonoscopy tomorrow so that she can stay on track for TPE on Wednesday.  - Clears today - Bowel prep this evening - N.p.o. at midnight aside from bowel prep and medications - Colonoscopy tomorrow  Sandor Flatter, DO, FACG [phone] office

## 2023-12-11 NOTE — TOC Progression Note (Signed)
 Transition of Care [facility]) - Progression Note    Patient Details  Name: Patricia Patton MRN: [id number] Date of Birth: 1970-05-07  [facility] CM/SW Contact  Andrez JULIANNA George, RN Phone Number: 12/11/2023, 1:36 PM  Clinical Narrative:     Pt continues to receive PLEX.  IP Care management following.  Expected Discharge Plan: Home/Self Care Barriers to Discharge: Continued Medical Work up               Expected Discharge Plan and Services   Discharge Planning Services: CM Consult   Living arrangements for the past 2 months: Single Family Home                                       Social Drivers of Health (SDOH) Interventions SDOH Screenings   Food Insecurity: No Food Insecurity (12/06/2023)  Housing: Low Risk  (12/06/2023)  Transportation Needs: No Transportation Needs (12/06/2023)  Utilities: Not At Risk (12/06/2023)  Financial Resource Strain: Low Risk  (07/30/2023)   Received from [facility]  Physical Activity: Insufficiently Active (08/13/2018)   Received from [facility]  Social Connections: Unknown (08/13/2018)   Received from [facility]  Tobacco Use: Medium Risk (12/11/2023)  Health Literacy: Low Risk  (06/20/2020)   Received from [facility]    Readmission Risk Interventions     No data to display

## 2023-12-11 NOTE — Progress Notes (Signed)
 NEUROLOGY CONSULT FOLLOW UP NOTE   Date of service: December 11, 2023 Patient Name: Eastyn Skalla MRN:  [id number] DOB:  1970-12-08  Review of HPI  53 y.o. female with hx of recently diagnosed right sided trigeminal neuralgia as well as anxiety and depression who presented to the ED with worsening right sided facial pain and bilateral shoulder pain.  She was in her usual state of health until 2 months ago.  She had no pre-existing neurologic conditions other than numbness in her fingertips and her toes which had been previously diagnosed by outside neurologist as small fiber neuropathy.  She began to have severe paroxysmal facial pain in her right face initially around the V1 region and then spreading to V2 and V3.  The paroxysms of pain were crippling and 10 out of 10 in severity.  They would be triggered reliably by touching her face or brushing her hair.  She initially sought care at [facility] and they suspected that she may be developing shingles and that her rash had just not shown yet.  She was treated with Valtrex and increasing doses of gabapentin .  She never did develop a rash.  The trigeminal neuralgia-like pain then spread to the left V1 region.  The right sided pain was still with a paroxysmal component but was becoming near constant and she was unable to function.  Prior to admission she was taking gabapentin  900 mg 3 times daily and carbamazepine  100 mg 3 times daily without significant benefit.  She denied any other neurologic symptoms although she reported pain and mild weakness in her neck and shoulders.  On initial neurological exam at [facility] on  9/23, she had bilateral deltoid weakness and was lethargic secondary to narcotics administered in the ED.  MRI brain was performed which showed no significant intracranial abnormalities but did show abnormal T2 signal throughout the visualized cervical cord and abnormal enhancement along the right lateral and left posterolateral surfaces of the  upper cervical spinal cord most concerning for a transverse myelitis.   Vitals   Vitals:   12/10/23 1651 12/10/23 2052 12/10/23 2358 12/11/23 0406  BP: 111/65 102/63 93/67 100/61  Pulse: 96 93 90 84  Resp: 18 16 16 18   Temp: 98.1 F (36.7 C) 98.2 F (36.8 C) 98.4 F (36.9 C) 98.2 F (36.8 C)  TempSrc: Oral Oral Oral Oral  SpO2: 98% 97% 94% 95%  Weight:      Height:         Body mass index is 24.52 kg/m.  Physical Exam   Constitutional: Appears well-developed and well-nourished.  Psych: Pained and tearful affect in the context of current 10/10 right facial pain.  Eyes: No scleral injection.  HENT: No OP obstrucion.  Head: Normocephalic.  Respiratory: Effort normal, non-labored breathing.    Neurologic Examination   Mental Status: Awake, alert and oriented. Speech fluent with intact naming and comprehension.  Cranial Nerves: II: PERRL. Fixates and tracks normally.    III,IV, VI: No ptosis. EOMI. No nystagmus.  V: Temp sensation decreased in right V1-3 distribution VII: Right facial weakness, lower quadrant (patient states this is chronic due to prior Bell's palsy about 10 years ago) VIII: Hearing intact to conversation IX,X: No hoarseness or hypophonia XI: Symmetric XII: Midline tongue extension Motor: Right : Upper extremity   5/5    Left:     Upper extremity   5/5  Lower extremity   5/5     Lower extremity   5/5 No pronator drift Sensory:  Temp and light touch intact throughout, bilaterally Deep Tendon Reflexes: 2+ and symmetric bilateral biceps and brachioradialis. 3+ bilateral patellae and achilles.  Cerebellar: No ataxia with FNF bilaterally. No tremor noted.  Gait: Deferred  Medications  Current Facility-Administered Medications:    acetaminophen  (TYLENOL ) tablet 650 mg, 650 mg, Oral, Q6H PRN, Alfornia Madison, MD, 650 mg at 12/10/23 1815   anticoagulant sodium citrate solution 5 mL, 5 mL, Intracatheter, PRN, Alfornia Madison, MD   carbamazepine   (TEGRETOL ) chewable tablet 200 mg, 200 mg, Oral, QID, Michaela Aisha SQUIBB, MD, 200 mg at 12/10/23 2136   Chlorhexidine  Gluconate Cloth 2 % PADS 6 each, 6 each, Topical, Daily, Samtani, Jai-Gurmukh, MD, 6 each at 12/10/23 1501   DULoxetine  (CYMBALTA ) DR capsule 60 mg, 60 mg, Oral, Daily, Rathore, Vasundhra, MD, 60 mg at 12/10/23 1038   gabapentin  (NEURONTIN ) capsule 900 mg, 900 mg, Oral, TID, Alfornia Madison, MD, 900 mg at 12/10/23 2136   naloxone  (NARCAN ) injection 0.4 mg, 0.4 mg, Intravenous, PRN, Rathore, Vasundhra, MD   nitroGLYCERIN (NITROSTAT) SL tablet 0.4 mg, 0.4 mg, Sublingual, Q5 min PRN, Samtani, Jai-Gurmukh, MD, 0.4 mg at 12/09/23 1253   oxyCODONE  (Oxy IR/ROXICODONE ) immediate release tablet 5 mg, 5 mg, Oral, Q6H PRN, Alfornia Madison, MD, 5 mg at 12/10/23 1525   sodium chloride  flush (NS) 0.9 % injection 10-40 mL, 10-40 mL, Intracatheter, Q12H, Samtani, Jai-Gurmukh, MD, 10 mL at 12/10/23 2138   sodium chloride  flush (NS) 0.9 % injection 10-40 mL, 10-40 mL, Intracatheter, PRN, Samtani, Jai-Gurmukh, MD   traZODone  (DESYREL ) tablet 150 mg, 150 mg, Oral, QHS, Alfornia Madison, MD, 150 mg at 12/10/23 2136  Labs and Diagnostic Imaging   CBC:  Recent Labs  Lab 12/10/23 0515 12/11/23 0443  WBC 8.8 9.3  NEUTROABS 7.0 4.7  HGB 12.0 11.7*  HCT 35.1* 34.6*  MCV 87.5 89.2  PLT 203 148*    Basic Metabolic Panel:  Lab Results  Component Value Date   NA 141 12/11/2023   K 3.1 (L) 12/11/2023   CO2 27 12/11/2023   GLUCOSE 86 12/11/2023   BUN 14 12/11/2023   CREATININE 0.75 12/11/2023   CALCIUM  8.2 (L) 12/11/2023   GFRNONAA >60 12/11/2023   GFRAA >60 09/30/2018   Lipid Panel: No results found for: LDLCALC HgbA1c: No results found for: HGBA1C Urine Drug Screen: No results found for: LABOPIA, COCAINSCRNUR, LABBENZ, AMPHETMU, THCU, LABBARB  Alcohol Level No results found for: [facility] H INR  Lab Results  Component Value Date   INR 1.3 (H) 12/10/2023   APTT   Lab Results  Component Value Date   APTT 26 12/07/2023     Assessment  Lyndell Analei Whinery is a 53 y.o. female with history of idiopathic small fiber neuropathy, presenting with bilateral facial pain and blurred vision (which is improved) who was found to have longitudinally extensive transverse myelitis involving the grey matter tracts with classic H-shaped spinal cord hyperintensity at multiple axial levels, and patchy enhancement along the spinal cord external surface at the affected levels. Her symptoms began with severe trigeminal neuralgia pain, which is unremitting and resistant to medications.  - Exam today reveals decreased temperature sensation in right V1-3 distribution and chronic right facial weakness due to prior Bell's palsy. Upper extremity reflexes are normoactive. Patellar and achilles reflexes are hyperactive, rated as 3+ bilaterally.    - Neuroimaging: - MRI orbits w/wo contrast: No evidence of acute or prior optic neuritis  - MRI brain w/wo contrast: No acute infarct. No acute intracranial hemorrhage. No mass  effect or midline shift. No hydrocephalus. The sella is unremarkable. Normal flow voids. There is mild subcortical white matter disease present bilaterally - MRI C-spine w/wo contrast:  Abnormal T2 signal throughout the visualized cervical cord and abnormal enhancement along the right lateral and left posterolateral surfaces of the upper cervical spinal cord. The findings are concerning for demyelinating disease, but neoplasm cannot be excluded.  - MRI L-spine w/wo contrast: No spinal canal stenosis or neural foraminal narrowing in the lumbar spine.  - MRI T-spine w/wo contrast: No evidence of demyelinating disease involving the thoracic spinal cord.  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 Although anti-MOG Ab titers do have high sensitivity of about 90%, MOGAD is still relatively high on the differential despite her negative titer due to the clinical and imaging characteristics.  - Anti-aqp4 antibodies were also negative, which makes neuromyelitis optica much less likely given that she has no brain lesions and no symptoms or signs of optic neuritis.   - At this point, even if this is not MOGAD, out suspicion that this is some type of autoimmune process is very high and for this reason she has been started on plasmapheresis. - Her chest pressure on Sunday was atypical, and with a negative D-dimer and cardiac workup, low suspicion for a cardiac event. However, MOGAD can present with bradycardia due to sinus node involvement, a rare but serious complication. However, her EKG showed tachycardia, not bradycardia. D-dimer was negative and cardiac workup was also negative. - Her LP shows mild inflammation as would be expected with an inflammatory myelitis.  - CT chest abdomen pelvis looking for possible paraneoplastic association does show an area of concern in the ascending colon, I would favor getting GI involvement. - Impression: Acute transverse myelitis, predominantly involving the central grey matter. An autoimmune etiology is favored.   Recommendations  - Follow-up serum myelitis panel, Mayo test code MAS1 - Follow-up CSF myelitis panel, Mayo test code MAC1 - Follow-up ANCA titers, ENA panel(includes SSA, SSBA among others) - Continue gabapentin  900 mg 3 times daily - Continue carbamazepine  at 200 mg 4 times daily - Escalate opiate pain medication for her intractable trigeminal neuralgia pain - GI is consulting. Colonoscopy is planned for tomorrow to further assess the apple core lesion in the ascending colon that was seen on CT  - PLEX Tx #3 today - Neurology will  continue to follow - Discussed with patient and family at the bedside. They expressed understanding and agreement with the current plan. All questions answered.  ______________________________________________________________________   Bonney SHARK, Beverley Sherrard, MD [facility]

## 2023-12-11 NOTE — Progress Notes (Signed)
 Pt tolerated TPE procedure #3 without any s/s of adverse events or hypocalcemia----please see TPE flowsheets for any further values

## 2023-12-11 NOTE — Progress Notes (Signed)
 [facility]   ROUNDING   NOTE Janda Cargo Jamroz FMW:[id number]  DOB: 10/31/1970  DOA: 12/06/2023  PCP: Claudene Rayfield HERO, MD  12/11/2023,10:40 AM  LOS: 5 days    Code Status:  full     From:  home   53 year old white female Right sided trigeminal neuralgia with increasing frequency Comorbid anxiety depression ADHD OCD on Cymbalta , currently Xanax-discontinued Lamictal -not treated ADHD currently Recent treatment for shingles OSA intolerant of CPAP Burning paresthesias in upper and lower extremities?  Small fiber neuropathy?  Fibromyalgia last evaluated in person at [facility] neurology 08/16/2022 and referred to pain management additionally Referred to rheumatology--Cymbalta  was added for the neuropathy and comorbid depression   9/23 [facility] admission worsening right-sided facial bilateral shoulder pain paroxysmal becoming near constant-note was on 900 gabapentin  3 times daily Tegretol  100 3 times daily without benefit   Discharged to [facility] for Plex and placement of catheter and started on high-dose steroids  9/26 LP performed showing predominant lymphocytosis Gram stain   [-]-ESR CRP negative ANA is negative NMO IgG less than 1.5 encephalitis panel negative antiextractable nuclear antigen   so Pending or oligoclonal bands IgG CSF ANCA titers 9/28 CT scan abdomen showed stricture versus abnormality right colon GI consulted     Assessment  & Plan :    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-LP and constellation of symptoms suggestive of myelitis Differential could be malignancy as below Differ further plan to neurology Added IV morphine for  severe pain behind eye and the blurred vision that she is having is from the myelitis Dilaudid  po for breakthru Trigeminal neuralgia Some worsening symptoms on right hemicranium Tegretol  now 200 4 times daily Stricture vs other in colon with severe constipation since her diagnosis of small fiber neuropathy Severe intermittent constipation since around 2024 with  stooling once a week only Seen on CTA b abd pelv  Prior CT ABD pelvis IV contrast 08/29/2023 at [facility] showed no bowel obstruction or inflammation Prior CT scan 02/23/2011 showed circumferential bowel thickening in the terminal ileum?  Crohn's at the time and this is when she was age 36 Cannot see report from 2012 colonoscopy GI planning eventual colonoscopy Mild thrombocytopenia Possibly related to Plex?  Monitor trends Mood disorder Continue Cymbalta  60 trazodone  150---  gabapentin  900 3 times daily still being given but ineffective Mild hypokalemia Replaced with K-Dur 40 daily get a.m. magnesium if no better Small fiber neuropathy Continue oxycodone , Cymbalta  for pain control and comorbid depression Impaired glucose tolerance Observe only for now    Data Reviewed:   Sodium 141 potassium 3.1 BUN/creat 14/0.7 WBC 9.3 hemoglobin 11.7 platelet 148 INR 1.3  DVT prophylaxis: SCD  Status is: Inpatient Remains inpatient appropriate because:   Requires further neuro insights     Current Dispo: Hopefully home     Subjective:    Significant pain behind R eye More than yesterday Mnot releived by current opiate/tegeretol Neuro thinks this is MOGAD causing her symptoms--Husband expressed good understanding form the explanation per Dr. Merrianne this am   Objective + exam Vitals:   12/10/23 2358 12/11/23 0406 12/11/23 0755 12/11/23 1016  BP: 93/67 100/61 110/78   Pulse: 90 84 88   Resp: 16 18 16    Temp: 98.4 F (36.9 C) 98.2 F (36.8 C) 98.1 F (36.7 C)   TempSrc: Oral Oral Oral   SpO2: 94% 95% 94%   Weight:    62.6 kg  Height:       Filed Weights   12/09/23 0451 12/09/23  1101 12/11/23 1016  Weight: 60.8 kg 60.8 kg 62.6 kg     Examination:  Sensory intact bilaterally  She has numbness over right cheek with continued pain below right supraorbital ridge Acuity intact--fundocopy deferred Abdomen soft S1 s2 no m Abd soft Power is 5/5 grossly Psych  euthymic  Scheduled Meds:  calcium  carbonate  2 tablet Oral Once in dialysis   carbamazepine   200 mg Oral QID   Chlorhexidine  Gluconate Cloth  6 each Topical Daily   DULoxetine   60 mg Oral Daily   gabapentin   900 mg Oral TID   potassium chloride  40 mEq Oral Daily   sodium chloride  flush  10-40 mL Intracatheter Q12H   traZODone   150 mg Oral QHS   Continuous Infusions:  albumin  human 25 % 50 g in sodium chloride  0.9 %     anticoagulant sodium citrate     anticoagulant sodium citrate     calcium  gluconate     citrate dextrose       Time 95  Jai-Gurmukh Bailei Buist, MD  [facility]

## 2023-12-11 NOTE — H&P (View-Only) (Signed)
 Daily Progress Note  DOA: 12/06/2023 Hospital Day: 6  Cc:  Abnormal CT scan concerning for colon mass  ASSESSMENT    53 yo female admitted with bilateral facial pain , blurred vision, chest discomfort. 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 Neurology following. Concern is for autoimmune process.  Undergoing plasmapheresis.   Abnormal colon on CT scan Recent bowel changes (decreased frequency) CT AP with contrast showing a short segment ascending colon irregular bowel wall thickening concerning for malignancy.    Principal Problem:   Neuromyelitis optica spectrum disorder [employer]) Active Problems:   Anxiety and depression    PLAN   --Patient would like to proceed with colonoscopy tomorrow. She tells me that she and Neurology discussed this earlier today. We have also reached out to Neurology. If they don't have concerns about sedation  / procedure from a Neurologic standpoint then will proceed with colonoscopy tomorrow. The risks and benefits of colonoscopy with possible polypectomy / biopsies were discussed and the patient agrees to proceed. She has been on clear liquids today     Subjective   No physical complaints. On clear liquids only. Hoping for colonoscopy tomorrow.    Objective    Recent Labs    12/09/23 0435 12/10/23 0515 12/11/23 0443  WBC 10.8* 8.8 9.3  HGB 11.9* 12.0 11.7*  HCT 34.9* 35.1* 34.6*  MCV 87.5 87.5 89.2  PLT 238 203 148*   No results for input(s): FOLATE, VITAMINB12, FERRITIN, TIBC, IRONPCTSAT in the last 72 hours. Recent Labs    12/09/23 0435 12/10/23 0515 12/11/23 0443  NA 140 143 141  K 3.6 3.7 3.1*  CL 102 108 106  CO2 27 25 27   GLUCOSE 115* 153* 86  BUN 16 13 14   CREATININE 0.83 0.73 0.75  CALCIUM  8.8* 8.8* 8.2*    Imaging:  CT CHEST ABDOMEN PELVIS W CONTRAST CLINICAL DATA:  Occult malignancy.  Possible neoplasm of the brain.  EXAM: CT CHEST, ABDOMEN, AND PELVIS WITH  CONTRAST  TECHNIQUE: Multidetector CT imaging of the chest, abdomen and pelvis was performed following the standard protocol during bolus administration of intravenous contrast.  RADIATION DOSE REDUCTION: This exam was performed according to the departmental dose-optimization program which includes automated exposure control, adjustment of the mA and/or kV according to patient size and/or use of iterative reconstruction technique.  CONTRAST:  75mL OMNIPAQUE IOHEXOL 350 MG/ML SOLN  COMPARISON:  None Available.  FINDINGS: CT CHEST FINDINGS  Cardiovascular: Partially visualized right chest wall dialysis catheter with tip at the superior cavoatrial junction. Normal heart size. No significant pericardial effusion. The thoracic aorta is normal in caliber. No atherosclerotic plaque of the thoracic aorta. No coronary artery calcifications.  Mediastinum/Nodes: No enlarged mediastinal, hilar, or axillary lymph nodes. Thyroid gland, trachea, and esophagus demonstrate no significant findings.  Lungs/Pleura: No focal consolidation. No pulmonary nodule. No pulmonary mass. No pleural effusion. No pneumothorax.  Musculoskeletal:  No chest wall abnormality.  No suspicious lytic or blastic osseous lesions. No acute displaced fracture.  CT ABDOMEN PELVIS FINDINGS  Hepatobiliary: No focal liver abnormality. No gallstones, gallbladder wall thickening, or pericholecystic fluid. No biliary dilatation.  Pancreas: No focal lesion. Normal pancreatic contour. No surrounding inflammatory changes. No main pancreatic ductal dilatation.  Spleen: Normal in size without focal abnormality.  Adrenals/Urinary Tract:  No adrenal nodule bilaterally.  Bilateral kidneys enhance symmetrically.  No hydronephrosis. No hydroureter.  The urinary bladder is unremarkable.  On delayed imaging, there is no urothelial wall thickening and there are no  filling defects in the opacified portions of the  bilateral collecting systems or ureters.  Stomach/Bowel: Stomach is within normal limits. No evidence of small bowel wall thickening or dilatation. Short segment ascending colon irregular bowel wall thickening with applee-core appearing lesion (6:52, 7:22). Stool throughout the majority of the colon. Appendix appears normal.  Vascular/Lymphatic: No abdominal aorta or iliac aneurysm. No abdominal, pelvic, or inguinal lymphadenopathy.  Reproductive: Uterus and bilateral adnexa are unremarkable.  Other: No intraperitoneal free fluid. No intraperitoneal free gas. No organized fluid collection.  Musculoskeletal:  No abdominal wall hernia or abnormality.  No suspicious lytic or blastic osseous lesions. No acute displaced fracture.  IMPRESSION: 1. Short segment ascending colon irregular bowel wall thickening concerning for malignancy. Recommend further evaluation with colonoscopy. 2. No acute intrathoracic or intrapelvic abnormality.  Electronically Signed   By: Morgane  Naveau M.D.   On: 12/09/2023 20:23     Scheduled inpatient medications:   calcium  carbonate  2 tablet Oral Once in dialysis   carbamazepine   200 mg Oral QID   Chlorhexidine  Gluconate Cloth  6 each Topical Daily   DULoxetine   60 mg Oral Daily   gabapentin   900 mg Oral TID   potassium chloride  40 mEq Oral Daily   sodium chloride  flush  10-40 mL Intracatheter Q12H   traZODone   150 mg Oral QHS   Continuous inpatient infusions:   albumin  human 25 % 50 g in sodium chloride  0.9 %     anticoagulant sodium citrate     anticoagulant sodium citrate     calcium  gluconate     citrate dextrose      PRN inpatient medications: acetaminophen , acetaminophen , anticoagulant sodium citrate, diphenhydrAMINE , morphine injection, naLOXone  (NARCAN )  injection, nitroGLYCERIN, oxyCODONE , sodium chloride  flush  Vital signs in last 24 hours: Temp:  [97.6 F (36.4 C)-98.4 F (36.9 C)] 97.6 F (36.4 C) (09/29 1211) Pulse Rate:   [78-96] 78 (09/29 1211) Resp:  [16-18] 16 (09/29 0755) BP: (93-112)/(61-78) 112/62 (09/29 1211) SpO2:  [94 %-98 %] 96 % (09/29 1211) Weight:  [62.6 kg] 62.6 kg (09/29 1016) Last BM Date : 12/04/23  Intake/Output Summary (Last 24 hours) at 12/11/2023 1331 Last data filed at 12/11/2023 1019 Gross per 24 hour  Intake 10 ml  Output --  Net 10 ml    Intake/Output from previous day: No intake/output data recorded. Intake/Output this shift: Total I/O In: 10 [I.V.:10] Out: -    Physical Exam:  General: Alert female in NAD Heart:  Regular rate and rhythm.  Pulmonary: Normal respiratory effort Abdomen: Soft, nondistended, nontender. Normal bowel sounds. Extremities: No lower extremity edema  Neurologic: Alert and oriented Psych: Pleasant. Cooperative     LOS: 5 days   Vina Dasen ,NP 12/11/2023, 1:31 PM    Attending physician's note   I have reviewed the chart and discussed her care on rounds. I performed a substantive portion of this encounter, including complete performance of at least one of the key components, in conjunction with the APP. I agree with the APP's note, impression, and recommendations with my edits.   CT reviewed and suspicious for apple core lesion in the ascending colon. Patient currently at TPE procedure #3.  Discussed with consulting Neurology service and will coordinate for colonoscopy tomorrow so that she can stay on track for TPE on Wednesday.  - Clears today - Bowel prep this evening - N.p.o. at midnight aside from bowel prep and medications - Colonoscopy tomorrow  Sandor Flatter, DO, FACG [phone] office

## 2023-12-12 ENCOUNTER — Inpatient Hospital Stay (HOSPITAL_COMMUNITY): Admitting: Anesthesiology

## 2023-12-12 ENCOUNTER — Encounter (HOSPITAL_COMMUNITY)
Admission: EM | Disposition: A | Discharge status: Home / Self Care | Payer: Self-pay | Source: Other Acute Inpatient Hospital | Attending: Family Medicine

## 2023-12-12 ENCOUNTER — Encounter (HOSPITAL_COMMUNITY): Payer: Self-pay | Admitting: Internal Medicine

## 2023-12-12 ENCOUNTER — Inpatient Hospital Stay (HOSPITAL_COMMUNITY)

## 2023-12-12 DIAGNOSIS — K641 Second degree hemorrhoids: Secondary | ICD-10-CM

## 2023-12-12 DIAGNOSIS — R933 Abnormal findings on diagnostic imaging of other parts of digestive tract: Secondary | ICD-10-CM

## 2023-12-12 DIAGNOSIS — F418 Other specified anxiety disorders: Secondary | ICD-10-CM

## 2023-12-12 HISTORY — PX: COLONOSCOPY: SHX5424

## 2023-12-12 LAB — CBC WITH DIFFERENTIAL/PLATELET
Abs Immature Granulocytes: 0.13 K/uL — ABNORMAL HIGH (ref 0.00–0.07)
Basophils Absolute: 0 K/uL (ref 0.0–0.1)
Basophils Relative: 0 %
Eosinophils Absolute: 0.3 K/uL (ref 0.0–0.5)
Eosinophils Relative: 3 %
HCT: 37.3 % (ref 36.0–46.0)
Hemoglobin: 12.4 g/dL (ref 12.0–15.0)
Immature Granulocytes: 1 %
Lymphocytes Relative: 30 %
Lymphs Abs: 3.3 K/uL (ref 0.7–4.0)
MCH: 30.1 pg (ref 26.0–34.0)
MCHC: 33.2 g/dL (ref 30.0–36.0)
MCV: 90.5 fL (ref 80.0–100.0)
Monocytes Absolute: 0.9 K/uL (ref 0.1–1.0)
Monocytes Relative: 8 %
Neutro Abs: 6.4 K/uL (ref 1.7–7.7)
Neutrophils Relative %: 58 %
Platelets: 121 K/uL — ABNORMAL LOW (ref 150–400)
RBC: 4.12 MIL/uL (ref 3.87–5.11)
RDW: 12.4 % (ref 11.5–15.5)
WBC: 11.1 K/uL — ABNORMAL HIGH (ref 4.0–10.5)
nRBC: 0 % (ref 0.0–0.2)

## 2023-12-12 LAB — BASIC METABOLIC PANEL WITH GFR
Anion gap: 13 (ref 5–15)
BUN: 10 mg/dL (ref 6–20)
CO2: 28 mmol/L (ref 22–32)
Calcium: 8.7 mg/dL — ABNORMAL LOW (ref 8.9–10.3)
Chloride: 102 mmol/L (ref 98–111)
Creatinine, Ser: 0.83 mg/dL (ref 0.44–1.00)
GFR, Estimated: >60 mL/min (ref >60)
Glucose, Bld: 109 mg/dL — ABNORMAL HIGH (ref 70–99)
Potassium: 3.9 mmol/L (ref 3.5–5.1)
Sodium: 143 mmol/L (ref 135–145)

## 2023-12-12 LAB — GLUCOSE, CAPILLARY: Glucose-Capillary: 76 mg/dL (ref 70–99)

## 2023-12-12 SURGERY — COLONOSCOPY
Anesthesia: Monitor Anesthesia Care

## 2023-12-12 MED ORDER — CARBAMAZEPINE 100 MG PO CHEW
200.0000 mg | CHEWABLE_TABLET | Freq: Four times a day (QID) | ORAL | Status: DC
  Administered 2023-12-12: 200 mg via ORAL
  Filled 2023-12-12 (×3): qty 2

## 2023-12-12 MED ORDER — CHLORHEXIDINE GLUCONATE CLOTH 2 % EX PADS
6.0000 | MEDICATED_PAD | Freq: Every day | CUTANEOUS | Status: DC
  Administered 2023-12-13 – 2023-12-20 (×5): 6 via TOPICAL

## 2023-12-12 MED ORDER — MORPHINE SULFATE (PF) 2 MG/ML IV SOLN
1.0000 mg | INTRAVENOUS | Status: DC | PRN
Start: 2023-12-12 — End: 2023-12-20
  Administered 2023-12-12 – 2023-12-18 (×15): 1 mg via INTRAVENOUS
  Filled 2023-12-12 (×15): qty 1

## 2023-12-12 MED ORDER — OXYCODONE HCL 5 MG PO TABS: 5.0000 mg | ORAL_TABLET | Freq: Once | ORAL | Status: DC | PRN

## 2023-12-12 MED ORDER — SODIUM CHLORIDE 0.9 % IV SOLN: 12.5000 mg | INTRAVENOUS | Status: DC | PRN

## 2023-12-12 MED ORDER — PROPOFOL 500 MG/50ML IV EMUL
INTRAVENOUS | Status: DC | PRN
  Administered 2023-12-12: 150 ug/kg/min via INTRAVENOUS

## 2023-12-12 MED ORDER — LIDOCAINE 2% (20 MG/ML) 5 ML SYRINGE
INTRAMUSCULAR | Status: DC | PRN
  Administered 2023-12-12: 60 mg via INTRAVENOUS

## 2023-12-12 MED ORDER — PHENYLEPHRINE 80 MCG/ML (10ML) SYRINGE FOR IV PUSH (FOR BLOOD PRESSURE SUPPORT)
PREFILLED_SYRINGE | INTRAVENOUS | Status: DC | PRN
  Administered 2023-12-12 (×2): 80 ug via INTRAVENOUS

## 2023-12-12 MED ORDER — OXYCODONE HCL 5 MG/5ML PO SOLN: 5.0000 mg | Freq: Once | ORAL | Status: DC | PRN

## 2023-12-12 MED ORDER — DULOXETINE HCL 60 MG PO CPEP
60.0000 mg | ORAL_CAPSULE | Freq: Every day | ORAL | Status: DC
  Administered 2023-12-13 – 2023-12-21 (×9): 60 mg via ORAL
  Filled 2023-12-12 (×9): qty 1

## 2023-12-12 MED ORDER — TRAZODONE HCL 100 MG PO TABS
150.0000 mg | ORAL_TABLET | Freq: Every day | ORAL | Status: DC
  Administered 2023-12-12 – 2023-12-20 (×9): 150 mg via ORAL
  Filled 2023-12-12 (×9): qty 1

## 2023-12-12 MED ORDER — PROPOFOL 10 MG/ML IV BOLUS
INTRAVENOUS | Status: DC | PRN
  Administered 2023-12-12: 60 mg via INTRAVENOUS

## 2023-12-12 MED ORDER — FENTANYL CITRATE (PF) 100 MCG/2ML IJ SOLN
INTRAMUSCULAR | Status: AC
  Filled 2023-12-12: qty 2

## 2023-12-12 MED ORDER — ACETAMINOPHEN 325 MG PO TABS: 650.0000 mg | ORAL_TABLET | Freq: Four times a day (QID) | ORAL | Status: DC | PRN

## 2023-12-12 MED ORDER — CARBAMAZEPINE 100 MG PO CHEW
100.0000 mg | CHEWABLE_TABLET | Freq: Three times a day (TID) | ORAL | Status: DC
Start: 2023-12-12 — End: 2023-12-21
  Administered 2023-12-12 – 2023-12-21 (×24): 100 mg via ORAL
  Filled 2023-12-12 (×29): qty 1

## 2023-12-12 MED ORDER — POLYVINYL ALCOHOL 1.4 % OP SOLN
2.0000 [drp] | OPHTHALMIC | Status: DC
Start: 2023-12-12 — End: 2023-12-14
  Administered 2023-12-12 – 2023-12-14 (×20): 2 [drp] via OPHTHALMIC
  Filled 2023-12-12: qty 15

## 2023-12-12 MED ORDER — POLYETHYLENE GLYCOL 3350 17 G PO PACK
17.0000 g | PACK | Freq: Two times a day (BID) | ORAL | Status: DC
  Administered 2023-12-12 – 2023-12-20 (×9): 17 g via ORAL
  Filled 2023-12-12 (×15): qty 1

## 2023-12-12 MED ORDER — OXYCODONE HCL 5 MG PO TABS
5.0000 mg | ORAL_TABLET | ORAL | Status: DC | PRN
Start: 2023-12-12 — End: 2023-12-14
  Administered 2023-12-12 – 2023-12-14 (×5): 5 mg via ORAL
  Filled 2023-12-12 (×5): qty 1

## 2023-12-12 MED ORDER — FENTANYL CITRATE (PF) 100 MCG/2ML IJ SOLN
INTRAMUSCULAR | Status: DC | PRN
  Administered 2023-12-12 (×2): 50 ug via INTRAVENOUS

## 2023-12-12 MED ORDER — SENNA 8.6 MG PO TABS
1.0000 | ORAL_TABLET | Freq: Every day | ORAL | Status: DC
  Administered 2023-12-12 – 2023-12-20 (×7): 8.6 mg via ORAL
  Filled 2023-12-12 (×8): qty 1

## 2023-12-12 MED ORDER — HYDROMORPHONE HCL 1 MG/ML IJ SOLN: 0.2500 mg | INTRAMUSCULAR | Status: DC | PRN

## 2023-12-12 MED ORDER — POLYVINYL ALCOHOL 1.4 % OP SOLN
1.0000 [drp] | OPHTHALMIC | Status: DC
  Filled 2023-12-12: qty 15

## 2023-12-12 MED ORDER — GADOBUTROL 1 MMOL/ML IV SOLN
6.0000 mL | Freq: Once | INTRAVENOUS | Status: AC | PRN
  Administered 2023-12-12: 6 mL via INTRAVENOUS

## 2023-12-12 MED ORDER — METRONIDAZOLE 0.75 % EX GEL
Freq: Two times a day (BID) | CUTANEOUS | Status: DC
  Filled 2023-12-12: qty 45

## 2023-12-12 MED ORDER — GABAPENTIN 300 MG PO CAPS
900.0000 mg | ORAL_CAPSULE | Freq: Three times a day (TID) | ORAL | Status: DC
Start: 2023-12-12 — End: 2023-12-21
  Administered 2023-12-12 – 2023-12-21 (×26): 900 mg via ORAL
  Filled 2023-12-12 (×26): qty 3

## 2023-12-12 NOTE — Anesthesia Postprocedure Evaluation (Signed)
 Anesthesia Post Note  Patient: Patricia Patton [facility]  Procedure(s) Performed: COLONOSCOPY     Patient location during evaluation: PACU Anesthesia Type: MAC Level of consciousness: awake and alert Pain management: pain level controlled Vital Signs Assessment: post-procedure vital signs reviewed and stable Respiratory status: spontaneous breathing, nonlabored ventilation and respiratory function stable Cardiovascular status: blood pressure returned to baseline and stable Postop Assessment: no apparent nausea or vomiting Anesthetic complications: no   No notable events documented.  Last Vitals:  Vitals:   12/12/23 1104 12/12/23 1127  BP: 111/66 113/75  Pulse: 79 82  Resp: 12 20  Temp: (!) 36.3 C 36.6 C  SpO2:  96%    Last Pain:  Vitals:   12/12/23 1127  TempSrc: Oral  PainSc:                  Butler Levander Pinal

## 2023-12-12 NOTE — Transfer of Care (Signed)
 Immediate Anesthesia Transfer of Care Note  Patient: Patricia Patton [facility]  Procedure(s) Performed: COLONOSCOPY  Patient Location: PACU  Anesthesia Type:MAC  Level of Consciousness: drowsy  Airway & Oxygen Therapy: Patient Spontanous Breathing and Patient connected to nasal cannula oxygen  Post-op Assessment: Report given to RN and Post -op Vital signs reviewed and stable  Post vital signs: Reviewed and stable  Last Vitals:  Vitals Value Taken Time  BP 99/66 12/12/23 10:03  Temp    Pulse 78 12/12/23 10:05  Resp 16 12/12/23 10:05  SpO2 99 % 12/12/23 10:05  Vitals shown include unfiled device data.  Last Pain:  Vitals:   12/12/23 0840  TempSrc: Temporal  PainSc: 10-Worst pain ever      Patients Stated Pain Goal: 3 (12/11/23 1745)  Complications: No notable events documented.

## 2023-12-12 NOTE — Progress Notes (Addendum)
 NEUROLOGY CONSULT FOLLOW UP NOTE   Date of service: December 12, 2023 Patient Name: Patricia Patton MRN:  [id number] DOB:  11/19/1970   Review of HPI  53 y.o. female with hx of recently diagnosed right sided trigeminal neuralgia as well as anxiety and depression who presented to the ED with worsening right sided facial pain and bilateral shoulder pain.  She was in her usual state of health until 2 months ago.  She had no pre-existing neurologic conditions other than numbness in her fingertips and her toes which had been previously diagnosed by outside neurologist as small fiber neuropathy.  She began to have severe paroxysmal facial pain in her right face initially around the V1 region and then spreading to V2 and V3.  The paroxysms of pain were crippling and 10 out of 10 in severity.  They would be triggered reliably by touching her face or brushing her hair.  She initially sought care at [facility] and they suspected that she may be developing shingles and that her rash had just not shown yet.  She was treated with Valtrex and increasing doses of gabapentin .  She never did develop a rash.  The trigeminal neuralgia-like pain then spread to the left V1 region.  The right sided pain was still with a paroxysmal component but was becoming near constant and she was unable to function.  Prior to admission she was taking gabapentin  900 mg 3 times daily and carbamazepine  100 mg 3 times daily without significant benefit.  She denied any other neurologic symptoms although she reported pain and mild weakness in her neck and shoulders.  On initial neurological exam at [facility] on  9/23, she had bilateral deltoid weakness and was lethargic secondary to narcotics administered in the ED.  MRI brain was performed which showed no significant intracranial abnormalities but did show abnormal T2 signal throughout the visualized cervical cord and abnormal enhancement along the right lateral and left posterolateral surfaces of the  upper cervical spinal cord most concerning for a transverse myelitis.   Interval Hx/subjective  Has continued headache pain and predominantly right sided trigeminal pain that improves but does not resolve with medication.   Vitals   Vitals:   12/12/23 1020 12/12/23 1104 12/12/23 1127 12/12/23 1629  BP: (!) 103/57 111/66 113/75 104/67  Pulse: 85 79 82 99  Resp: (!) 21 12 20 13   Temp:  (!) 97.4 F (36.3 C) 97.8 F (36.6 C) 97.7 F (36.5 C)  TempSrc:  Axillary Oral Axillary  SpO2: 98%  96% 97%  Weight:      Height:         Body mass index is 25.24 kg/m.  Physical Exam   Constitutional: Appears well-developed and well-nourished.  Psych: Improved affect relative to yesterday.  Eyes: No scleral injection.  HENT: No OP obstrucion.  Head: Normocephalic.  Respiratory: Effort normal, non-labored breathing.   Neurologic Examination   Mental Status: Awake, alert and oriented. Speech fluent with intact naming and comprehension.  Cranial Nerves: II: PERRL. Fixates and tracks normally.    III,IV, VI: No ptosis. EOMI. No nystagmus.  V: Temp sensation decreased in right V1-3 distribution VII: Right facial weakness, lower quadrant (patient states this is chronic due to prior Bell's palsy about 10 years ago) VIII: Hearing intact to conversation IX,X: No hoarseness or hypophonia XI: Symmetric XII: Midline tongue extension Motor: Right :  Upper extremity   5/5  Left:     Upper extremity   5/5             Lower extremity   5/5                                                  Lower extremity   5/5 No pronator drift Sensory: Temp sensation decreased to right face, arm and leg. Coarse touch with glove is subjectively equal bilaterally.  Deep Tendon Reflexes: 3+ and symmetric bilateral biceps and brachioradialis. 4+ bilateral patellae, 3+ bilateral achilles. Reflexes are brisker than yesterday.  Cerebellar: No ataxia with FNF bilaterally. No tremor noted.   Gait: Deferred  Medications  Current Facility-Administered Medications:    acetaminophen  (TYLENOL ) tablet 650 mg, 650 mg, Oral, Q6H PRN, Samtani, Jai-Gurmukh, MD   artificial tears ophthalmic solution 2 drop, 2 drop, Both Eyes, Q2H, Samtani, Jai-Gurmukh, MD, 2 drop at 12/12/23 1749   carbamazepine  (TEGRETOL ) chewable tablet 100 mg, 100 mg, Oral, TID, Samtani, Jai-Gurmukh, MD   [START ON 12/13/2023] DULoxetine  (CYMBALTA ) DR capsule 60 mg, 60 mg, Oral, Daily, Samtani, Jai-Gurmukh, MD   gabapentin  (NEURONTIN ) capsule 900 mg, 900 mg, Oral, TID, Samtani, Jai-Gurmukh, MD, 900 mg at 12/12/23 1625   metroNIDAZOLE (METROGEL) 0.75 % gel, , Topical, BID, Samtani, Jai-Gurmukh, MD   morphine (PF) 2 MG/ML injection 1 mg, 1 mg, Intravenous, Q2H PRN, Samtani, Jai-Gurmukh, MD, 1 mg at 12/12/23 1459   oxyCODONE  (Oxy IR/ROXICODONE ) immediate release tablet 5 mg, 5 mg, Oral, Q4H PRN, Samtani, Jai-Gurmukh, MD, 5 mg at 12/12/23 1748   polyethylene glycol (MIRALAX / GLYCOLAX) packet 17 g, 17 g, Oral, BID, Samtani, Jai-Gurmukh, MD   senna (SENOKOT) tablet 8.6 mg, 1 tablet, Oral, QHS, Samtani, Jai-Gurmukh, MD   traZODone  (DESYREL ) tablet 150 mg, 150 mg, Oral, QHS, Samtani, Jai-Gurmukh, MD  Labs and Diagnostic Imaging   CBC:  Recent Labs  Lab 12/11/23 0443 12/12/23 0200  WBC 9.3 11.1*  NEUTROABS 4.7 6.4  HGB 11.7* 12.4  HCT 34.6* 37.3  MCV 89.2 90.5  PLT 148* 121*    Basic Metabolic Panel:  Lab Results  Component Value Date   NA 143 12/12/2023   K 3.9 12/12/2023   CO2 28 12/12/2023   GLUCOSE 109 (H) 12/12/2023   BUN 10 12/12/2023   CREATININE 0.83 12/12/2023   CALCIUM  8.7 (L) 12/12/2023   GFRNONAA >60 12/12/2023   GFRAA >60 09/30/2018   Lipid Panel: No results found for: LDLCALC HgbA1c: No results found for: HGBA1C Urine Drug Screen: No results found for: LABOPIA, COCAINSCRNUR, LABBENZ, AMPHETMU, THCU, LABBARB  Alcohol Level No results found for: [facility] INR  Lab Results   Component Value Date   INR 1.3 (H) 12/10/2023   APTT  Lab Results  Component Value Date   APTT 26 12/07/2023     Assessment  Patricia Patton is a 53 y.o. female with history of idiopathic small fiber neuropathy, presenting with bilateral facial pain and blurred vision (which is improved) who was found to have longitudinally extensive transverse myelitis involving the grey matter tracts with classic H-shaped spinal cord hyperintensity at multiple axial levels, and patchy enhancement along the spinal cord external surface at the affected levels. Her symptoms began with severe trigeminal neuralgia pain, which is unremitting and resistant to medications.  - Exam today reveals decreased temperature sensation in right V1-3 distribution and  chronic right facial weakness due to prior Bell's palsy. Upper extremity reflexes are normoactive. Upper and lower extremity reflexes are more hyperactive than yesterday, but no new limb weakness. Decreased temperature sensation to right face, arm and leg.     - Neuroimaging: - MRI orbits w/wo contrast: No evidence of acute or prior optic neuritis  - MRI brain w/wo contrast: No acute infarct. No acute intracranial hemorrhage. No mass effect or midline shift. No hydrocephalus. The sella is unremarkable. Normal flow voids. There is mild subcortical white matter disease present bilaterally - MRI C-spine w/wo contrast:  Abnormal T2 signal throughout the visualized cervical cord and abnormal enhancement along the right lateral and left posterolateral surfaces of the upper cervical spinal cord. The findings are concerning for demyelinating disease, but neoplasm cannot be excluded.  - MRI L-spine w/wo contrast: No spinal canal stenosis or neural foraminal narrowing in the lumbar spine.  - MRI T-spine w/wo contrast: No evidence of demyelinating disease involving the thoracic spinal cord.  - EKG: Sinus tachycardia; Otherwise normal ECG - Although she has negative  anti-MOG antibodies, her MRI findings are typical for this entity and MOGAD (myelin-oligodendrocyte glycoprotein antibody-associated disease) can present with acute trigeminal neuralgia as well, based on multiple case reports documented in the literature. Although anti-MOG Ab titers do have high sensitivity of about 90%, MOGAD is still relatively high on the differential despite her negative titer due to the clinical and imaging characteristics.  - Anti-aqp4 antibodies were also negative, which makes neuromyelitis optica much less likely given that she has no brain lesions and no symptoms or signs of optic neuritis.   - At this point, even if this is not MOGAD, out suspicion that this is some type of autoimmune process is very high and for this reason she has been started on plasmapheresis. - Her chest pressure on Sunday was atypical, and with a negative D-dimer and cardiac workup, low suspicion for a cardiac event. However, MOGAD can present with bradycardia due to sinus node involvement, a rare but serious complication. However, her EKG showed tachycardia, not bradycardia. D-dimer was negative and cardiac workup was also negative. - Her LP shows mild inflammation as would be expected with an inflammatory myelitis.  - CT chest abdomen pelvis looking for possible paraneoplastic association does show an area of concern in the ascending colon, I would favor getting GI involvement. - Platelets have been trending downwards, now thrombocytopenic. DDx includes secondary to carbamazepine  versus a rare complication of PLEX. - Complaining of new onset blurred vision. DDx includes due to NMO versus MOGAD versus secondary to headache pain. Her blurred vision is worse with either eye closed, better with binocular vision. Dr. Corbin of Ophthalmology ordered MRI orbits although received steroids so not really sure what else to do.  - Colonoscopy per personal communication with Hospitalist refutes the CT findings.  -  Impression: Acute transverse myelitis, predominantly involving the central grey matter. An autoimmune etiology is favored.   Recommendations  - Follow-up serum myelitis panel, Mayo test code MAS1 - Follow-up CSF myelitis panel, Mayo test code MAC1 - Follow-up ANCA titers, ENA panel(includes SSA, SSBA among others) - Continue gabapentin  900 mg 3 times daily - Decreasing carbamazepine  back to her home dosage regimen, as it has been ineffective at controlling her pain and may be causing her thrombocytopenia.  - Escalate opiate pain medication for her intractable trigeminal neuralgia pain - PLEX Tx #4 was planned for tomorrow. I have ordered CBC for tomorrow at 5 AM and  if low, we can consider holding PLEX that is scheduled for tomorrow.  - Continue to monitor her vision for any changes - Repeat MRI orbits is pending. - Neurology will continue to follow   ______________________________________________________________________   Bonney SHARK, Neythan Kozlov, MD [facility]

## 2023-12-12 NOTE — Anesthesia Preprocedure Evaluation (Addendum)
 Anesthesia Evaluation  Patient identified by MRN, date of birth, ID band Patient awake    Reviewed: Allergy & Precautions, H&P , NPO status , Patient's Chart, lab work & pertinent test results  Airway Mallampati: II  TM Distance: >3 FB Neck ROM: Full    Dental  (+) Dental Advisory Given   Pulmonary neg pulmonary ROS, former smoker   Pulmonary exam normal breath sounds clear to auscultation       Cardiovascular negative cardio ROS Normal cardiovascular exam Rhythm:Regular Rate:Normal     Neuro/Psych  Headaches  Anxiety Depression     Neuromuscular disease  negative psych ROS   GI/Hepatic negative GI ROS, Neg liver ROS,,,  Endo/Other  negative endocrine ROS    Renal/GU negative Renal ROS  negative genitourinary   Musculoskeletal negative musculoskeletal ROS (+)    Abdominal   Peds negative pediatric ROS (+)  Hematology negative hematology ROS (+)   Anesthesia Other Findings   Reproductive/Obstetrics negative OB ROS                              Anesthesia Physical Anesthesia Plan  ASA: 3  Anesthesia Plan: MAC   Post-op Pain Management: Gabapentin  PO (pre-op)* and Tylenol  PO (pre-op)*   Induction: Intravenous  PONV Risk Score and Plan: 2 and Propofol infusion and Treatment may vary due to age or medical condition  Airway Management Planned: Simple Face Mask  Additional Equipment:   Intra-op Plan:   Post-operative Plan:   Informed Consent: I have reviewed the patients History and Physical, chart, labs and discussed the procedure including the risks, benefits and alternatives for the proposed anesthesia with the patient or authorized representative who has indicated his/her understanding and acceptance.     Dental advisory given  Plan Discussed with: CRNA  Anesthesia Plan Comments:         Anesthesia Quick Evaluation

## 2023-12-12 NOTE — Op Note (Signed)
 [facility] Patient Name: Patricia Patton Procedure Date : 12/12/2023 MRN: [id number] Attending MD: Sandor Flatter , MD, [id number] Date of Birth: Apr 02, 1970 CSN: [id number] Age: 53 Admit Type: Inpatient Procedure:                Colonoscopy Indications:              Abnormal CT of the GI tract Providers:                Sandor Flatter, MD, Gregoria Pierce, RN, Fairy Marina, Technician Referring MD:             Ellouise Haber Medicines:                Monitored Anesthesia Care Complications:            No immediate complications. Estimated Blood Loss:     Estimated blood loss: none. Procedure:                Pre-Anesthesia Assessment:                           - Prior to the procedure, a History and Physical                            was performed, and patient medications and                            allergies were reviewed. The patient's tolerance of                            previous anesthesia was also reviewed. The risks                            and benefits of the procedure and the sedation                            options and risks were discussed with the patient.                            All questions were answered, and informed consent                            was obtained. Prior Anticoagulants: The patient has                            taken no anticoagulant or antiplatelet agents. ASA                            Grade Assessment: III - A patient with severe                            systemic disease. After reviewing the risks and  benefits, the patient was deemed in satisfactory                            condition to undergo the procedure.                           After obtaining informed consent, the colonoscope                            was passed under direct vision. Throughout the                            procedure, the patient's blood pressure, pulse, and                             oxygen saturations were monitored continuously. The                            PCF-HQ190L ([id number]) Olympus colonoscope was                            introduced through the anus and advanced to the the                            terminal ileum. The colonoscopy was performed                            without difficulty. The patient tolerated the                            procedure well. The quality of the bowel                            preparation was good. The terminal ileum, ileocecal                            valve, appendiceal orifice, and rectum were                            photographed. Scope In: 9:46:02 AM Scope Out: 10:00:19 AM Scope Withdrawal Time: 0 hours 10 minutes 53 seconds  Total Procedure Duration: 0 hours 14 minutes 17 seconds  Findings:      The perianal and digital rectal examinations were normal.      The entire colon appeared normal.      Retroflexion in the right colon was performed.      Non-bleeding internal hemorrhoids were found during retroflexion. The       hemorrhoids were small and Grade II (internal hemorrhoids that prolapse       but reduce spontaneously).      The terminal ileum appeared normal. Impression:               - The entire examined colon is normal.                           - Ascending colon was  normal-appearing on                            anterograde and retroflexed views. Made several                            passes through the right colon and no pathology                            noted.                           - Non-bleeding internal hemorrhoids.                           - The examined portion of the ileum was normal.                           - No specimens collected. Recommendation:           - Return patient to hospital ward for ongoing care.                           - Advance diet as tolerated.                           - Continue present medications.                           - Repeat colonoscopy in 10 years for  screening                            purposes.                           - Return to GI office PRN.                           - Inpatient GI service will sign off at this time.                            Please do not hesitate to contact us  with                            additional questions or concerns. Procedure Code(s):        --- Professional ---                           [phone], Colonoscopy, flexible; diagnostic, including                            collection of specimen(s) by brushing or washing,                            when performed (separate procedure) Diagnosis Code(s):        --- Professional ---  K64.1, Second degree hemorrhoids                           R93.3, Abnormal findings on diagnostic imaging of                            other parts of digestive tract CPT copyright 2022 American [employer] Association. All rights reserved. The codes documented in this report are preliminary and upon coder review may  be revised to meet current compliance requirements. Sandor Flatter, MD 12/12/2023 10:10:09 AM Number of Addenda: 0

## 2023-12-12 NOTE — Progress Notes (Signed)
 Pt came back from MRI, vital signs taken, alert and oriented.

## 2023-12-12 NOTE — Interval H&P Note (Signed)
 History and Physical Interval Note:  12/12/2023 9:26 AM  Patricia Patton  has presented today for surgery, with the diagnosis of Abnormal colon on CT scan.  The various methods of treatment have been discussed with the patient and family. After consideration of risks, benefits and other options for treatment, the patient has consented to  Procedure(s): COLONOSCOPY (N/A) as a surgical intervention.  The patient's history has been reviewed, patient examined, no change in status, stable for surgery.  I have reviewed the patient's chart and labs.  Questions were answered to the patient's satisfaction.     Sandor GAILS Johnryan Sao

## 2023-12-12 NOTE — Progress Notes (Addendum)
 [facility]   ROUNDING   NOTE Patricia Patton FMW:[id number]  DOB: 1971-02-28  DOA: 12/06/2023  PCP: Claudene Rayfield HERO, MD  12/12/2023,3:50 PM  LOS: 6 days    Code Status:  full     From:  home   53 year old white female Right sided trigeminal neuralgia with increasing frequency Comorbid anxiety depression ADHD OCD on Cymbalta , currently Xanax-discontinued Lamictal -not treated ADHD currently Recent treatment for shingles OSA intolerant of CPAP Burning paresthesias in upper and lower extremities?  Small fiber neuropathy?  Fibromyalgia last evaluated in person at [facility] neurology 08/16/2022 and referred to pain management additionally Referred to rheumatology--Cymbalta  was added for the neuropathy and comorbid depression   9/23 [facility] admission worsening right-sided facial bilateral shoulder pain paroxysmal becoming near constant-note was on 900 gabapentin  3 times daily Tegretol  100 3 times daily without benefit   Discharged to [facility] for Plex and placement of catheter and started on high-dose steroids  9/26 LP performed showing predominant lymphocytosis Gram stain   [-]-ESR CRP negative ANA is negative NMO IgG less than 1.5 encephalitis panel negative antiextractable nuclear antigen   so Pending or oligoclonal bands IgG CSF ANCA titers 9/28 CT scan abdomen showed stricture versus abnormality right colon GI consulted 9/30 colonoscopy = nonbleeding hemorrhoids ileum normal no specimen collected and: Normal grade 2     Assessment  & Plan :    Acute transverse myelitis --etiology unclear She however does have blurred vision which is worsened so I am not sure if there is anything else that we need to do from a neurology or ophthalmology perspective Received 5 days of Solu-Medrol  ending 9/27 Added IV morphine for  severe pain behind eye and the blurred vision that she is having is from the myelitis Dilaudid  po for breakthru ?optic neuritis Case discussed with Dr. Corbin ophthalmology who recommends  repeat MRI orbits with contrast Has already received treatment doses Solu-Medrol  If any changes would call either him back tonight and/or on-call tomorrow if MRI is not able to be done I will sign this out to my partner tonight Trigeminal neuralgia Some worsening symptoms on right hemicranium--- Tegretol  increased not helpful neither is gabapentin  Will defer to neurology further management-historically for her gabapentinoids do not really work Facial acne/rosacea Possibly due to steroids-start topical metronidazole cream 0.75% twice daily hopefully will improve I do not think any other etiology at this time Stricture vs other in colon with severe constipation since her diagnosis of small fiber neuropathy Severe intermittent constipation since around 2024 with stooling once a week only Seen on CTA b abd pelv  Colonoscopy 9/30 refutes diagnosis Good bowel regimen for constipation MiraLAX 17 twice daily, senna 1 at bedtime-backed down off of MiraLAX if diarrhea Mild thrombocytopenia Possibly related to Plex?  Monitor trends Will ask neurology as this is an atypical result of Plex but could be seen In case this is related to her Tegretol  I have cut back to 100 3 times daily Mood disorder Continue Cymbalta  60 trazodone  150 gabapentin  900 3 times daily still being given but ineffective Mild hypokalemia Improved periodic labs Small fiber neuropathy Continue oxycodone , Cymbalta  for pain control and comorbid depression Impaired glucose tolerance Observe only for now -stable   Data Reviewed:    DVT prophylaxis: SCD  Status is: Inpatient Remains inpatient appropriate because:   Requires further neuro insights     Current Dispo: Hopefully home     Subjective:    Needs to have pretty bad pain behind the right eye with  radiation down both shoulders She is breaking out over her face as well with acne She understands her colonoscopy  Objective + exam Vitals:   12/12/23 1010  12/12/23 1020 12/12/23 1104 12/12/23 1127  BP: 101/64 (!) 103/57 111/66 113/75  Pulse: 81 85 79 82  Resp: 15 (!) 21 12 20   Temp:   (!) 97.4 F (36.3 C) 97.8 F (36.6 C)  TempSrc:   Axillary Oral  SpO2: 99% 98%  96%  Weight:      Height:       Filed Weights   12/09/23 0451 12/09/23 1101 12/11/23 1016  Weight: 60.8 kg 60.8 kg 62.6 kg     Examination:  Sensory intact bilaterally  She has numbness over right cheek with continued pain below right supraorbital ridge Now developing by facial acne Acuity intact--but vision is blurred 2 over 6 feet distance reading Abdomen soft S1 s2 no m Abdomen is soft power is grossly improved compared to initial exam  Psych euthymic although a little tired  Scheduled Meds:  carbamazepine   100 mg Oral TID   [START ON 12/13/2023] DULoxetine   60 mg Oral Daily   gabapentin   900 mg Oral TID   metroNIDAZOLE   Topical BID   polyethylene glycol  17 g Oral BID   senna  1 tablet Oral QHS   traZODone   150 mg Oral QHS   Continuous Infusions: Time 35  Jai-Gurmukh Kymber Kosar, MD  [facility]

## 2023-12-13 DIAGNOSIS — G500 Trigeminal neuralgia: Secondary | ICD-10-CM

## 2023-12-13 LAB — CBC WITH DIFFERENTIAL/PLATELET
Abs Immature Granulocytes: 0.09 K/uL — ABNORMAL HIGH (ref 0.00–0.07)
Basophils Absolute: 0 K/uL (ref 0.0–0.1)
Basophils Relative: 0 %
Eosinophils Absolute: 0.4 K/uL (ref 0.0–0.5)
Eosinophils Relative: 4 %
HCT: 36.5 % (ref 36.0–46.0)
Hemoglobin: 12.3 g/dL (ref 12.0–15.0)
Immature Granulocytes: 1 %
Lymphocytes Relative: 28 %
Lymphs Abs: 2.5 K/uL (ref 0.7–4.0)
MCH: 30.1 pg (ref 26.0–34.0)
MCHC: 33.7 g/dL (ref 30.0–36.0)
MCV: 89.5 fL (ref 80.0–100.0)
Monocytes Absolute: 0.9 K/uL (ref 0.1–1.0)
Monocytes Relative: 10 %
Neutro Abs: 5.1 K/uL (ref 1.7–7.7)
Neutrophils Relative %: 57 %
Platelets: 110 K/uL — ABNORMAL LOW (ref 150–400)
RBC: 4.08 MIL/uL (ref 3.87–5.11)
RDW: 12.3 % (ref 11.5–15.5)
WBC: 8.9 K/uL (ref 4.0–10.5)
nRBC: 0 % (ref 0.0–0.2)

## 2023-12-13 LAB — MISC LABCORP TEST (SEND OUT): Labcorp test code: 505592

## 2023-12-13 MED ORDER — DIPHENHYDRAMINE HCL 25 MG PO CAPS
25.0000 mg | ORAL_CAPSULE | Freq: Four times a day (QID) | ORAL | Status: DC | PRN
  Administered 2023-12-13 – 2023-12-20 (×11): 25 mg via ORAL
  Filled 2023-12-13 (×13): qty 1

## 2023-12-13 NOTE — Plan of Care (Signed)
  Problem: Health Behavior/Discharge Planning: Goal: Ability to manage health-related needs will improve Outcome: Progressing   Problem: Activity: Goal: Risk for activity intolerance will decrease Outcome: Progressing   Problem: Nutrition: Goal: Adequate nutrition will be maintained Outcome: Progressing   Problem: Coping: Goal: Level of anxiety will decrease Outcome: Progressing   Problem: Elimination: Goal: Will not experience complications related to bowel motility Outcome: Progressing   Problem: Pain Managment: Goal: General experience of comfort will improve and/or be controlled Outcome: Progressing   Problem: Safety: Goal: Ability to remain free from injury will improve Outcome: Progressing   Problem: Skin Integrity: Goal: Risk for impaired skin integrity will decrease Outcome: Progressing

## 2023-12-13 NOTE — Consult Note (Signed)
 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   Assessment and Plan: Acute Transverse Myelitis  Severe Trigeminal Neuralgia  - The etiology of her acute transverse myelitis and severe trigeminal neuralgia remains unknown. LP was done with some mild inflammation noted but negative meningitis/encephalitis panel. CSF myelitis panel, ANCA titers and ENA panel are all send outs and/or pending. HSV was negative the meningitis panel however, there is concern that maybe this is PCR negative HSV. Based on records and when talking to the patient, it appears as though [facility] when seen in the outpatient setting initially thought she may have had shingles and prescribed Valtrex. The patient states that the Valtrex did not help and that she has never developed a rash. Unlikely that the etiology of all of her neuro findings are secondary to HSV. We have called [facility] and have added on HSV1 and 2 IgG to her CSF fluid that had already been sent out. Do not recommend starting anti virals at this time.    Principal Problem:   Neuromyelitis optica spectrum disorder (HCC) Active Problems:   Anxiety and depression   Abnormal finding on GI tract imaging   Grade II internal hemorrhoids    artificial tears  2 drop Both Eyes Q2H   carbamazepine   100 mg Oral TID   Chlorhexidine  Gluconate Cloth  6 each Topical Daily   DULoxetine   60 mg Oral Daily   gabapentin   900 mg Oral TID   metroNIDAZOLE   Topical BID   polyethylene glycol  17 g Oral BID   senna  1 tablet Oral QHS   traZODone   150 mg Oral QHS    HPI: Patricia Patton is a 53 y.o. female with recently diagnosed trigeminal neuralgia as well as anxiety and depression who presented to the ED with worsening right sided  facial pain and bilateral shoulder pain. The patient was in her usual state of health until about 2 months ago when she had sudden onset of head pain that then progressed down the right side of her face. The patient had initially presented to [facility] 2 days prior to admission for an intractable headache and right sided facial pain. MRI brain was done and showed an abnormal T2 signal throughout the visualized cervical cord and abnormal enhancement along the right lateral and left posterior lateral surfaces of the upper cervical spinal cord most concerning for active demyelination. MRI of her C-spine showed an abnormal increased T2 signal within the spinal cord extending from the cervical medullary junction to the mid body of C6 with abnormal enhancement posterolaterally on the right at the cervical medullary junction and posterolaterally on the left at the C1 and C2 vertebral body levels consistent with demyelinating disease.  MRI of orbits, thoracic spine, and lumbar spine negative for acute findings. Neurology was consulted and are suspecting neuromyelitis optica spectrum disorder. Confirmatory autoantibody testing was sent on 9/23 and is negative. The patient was started on high-dose steroids and transferred to [facility] for concurrent PLEX for 2-3 consecutive days followed by every other day for a total of 5 sessions.  Neurology recommended Solu-Medrol  1 g every 24 hours x 5 days (end date 9/27), Carbamazepine  200 mg 3 times daily, Gabapentin  900 mg 3 times daily, and  PRN narcotics until pain is better controlled. Unfortunately, the patient states that the pain is now starting to travel to her left side however, it is not as painful as the right side.  Further work up this admission has consisted of a lumbar puncture with a negative meningitis/encephalitis panel. Serum Autoimmune Myelopathy panel has resulted and is pan-negative. 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 Anti-aqp4 antibodies were also negative, which makes neuromyelitis optica much less likely given that she has no brain lesions and no symptoms or signs of optic neuritis. According to Neurology, if her diagnosis is not MOGAD, it is likely an autoimmune process. Neurology did recommend an ID consult to further assess the possibility of PCR negative HSV infection as a potential etiology for her acute transverse ,yelitis and recent onset of bilateral neuralgia.   Of note, CT chest, abdomen and pelvis was concerning for malignancy so GI was consulted and the patient is now s/p colonoscopy with a normal colon noted on her op note  Ophthalmology was also asked to see the patient and based on their exam, there were no findings on ophthalmic exam which would aid in diagnosis for this patient.   Review of Systems: Constitutional: Negative for fever, chills, fatigue or unexpected weight change. HENT: Negative for rhinorrhea and sore throat. Eyes: +blurry vision  Respiratory: Negative for cough and shortness of breath. Cardiovascular: Negative for chest pain and palpitations. Gastrointestinal: Negative for abdominal pain, nausea, vomiting and diarrhea. Genitourinary: Negative for dysuria and hematuria. Musculoskeletal: Negative for arthralgias and joint swelling. Skin: Negative for rash. Neuro: Severe right and somewhat left facial pain    Past Medical History:  Diagnosis Date   Anxiety    Bell's palsy    Strep throat 2018   Social History   Tobacco Use   Smoking status: Former   Smokeless tobacco: Never  Substance Use Topics   Alcohol use: Yes    Comment: occasional    Drug use: Yes    Types: Marijuana   Family History  Problem Relation Age of Onset   Diabetes Mother    Diabetes Father    Allergies  Allergen Reactions    Amoxicillin-Pot Clavulanate Hives and Swelling    Specifically Augmentin    OBJECTIVE: Blood pressure 106/83, pulse 98, temperature 98 F (36.7 C), temperature source Oral, resp. rate 17, height 5' 2 (1.575 m), weight 59 kg, SpO2 92%.  General: Well developed, well nourished female in no apparent distress. Acutely ill appearing  HENT: Moist mucous membranes, normal nose, normal external ears, and normocephalic. Severe right sided facial pain with light touch Neck: supple, trachea midline, and normal cervical range of motion Eyes: PERRL, EOMI, non-icteric, and normal conjunctivae and lids Lungs: Clear to auscultation bilaterally. No wheezing, rales or rhonchi Cardiac: Regular rate and rhythm. No murmurs, rubs or gallops. No peripheral edema Abdomen: Soft, Non distended, Non tender, active bowel sounds Skin: Intact, no focal erythema or rash, and warm and dry GU: Deferred genital exam Musculoskeletal: No obvious skeletal abnormalities Neuro: Alert, no focal neurologic deficits, moves all extremities Psych: Oriented x 3, cooperative   Lab Results Lab Results  Component Value Date   WBC 8.9 12/13/2023   HGB 12.3 12/13/2023   HCT 36.5 12/13/2023   MCV 89.5 12/13/2023   PLT 110 (L) 12/13/2023    Lab Results  Component Value Date   CREATININE 0.83  12/12/2023   BUN 10 12/12/2023   NA 143 12/12/2023   K 3.9 12/12/2023   CL 102 12/12/2023   CO2 28 12/12/2023    Lab Results  Component Value Date   ALT 27 12/07/2023   AST 18 12/07/2023   ALKPHOS 50 12/07/2023   BILITOT 0.5 12/07/2023    Microbiology: Recent Results (from the past 240 hours)  CSF culture w Gram Stain     Status: None   Collection Time: 12/08/23  1:31 PM   Specimen: CSF; Cerebrospinal Fluid  Result Value Ref Range Status   Specimen Description CSF  Final   Special Requests NONE  Final   Gram Stain   Final    WBC PRESENT, PREDOMINANTLY MONONUCLEAR NO ORGANISMS SEEN CYTOSPIN SMEAR NO RBC SEEN    Culture    Final    NO GROWTH 3 DAYS Performed at [facility], [address], [place], [place] [address]    Report Status 12/11/2023 FINAL  Final   Evalene Munch, MD [facility]  12/13/2023 4:46 PM

## 2023-12-13 NOTE — TOC CM/SW Note (Signed)
 12-13-2023 Patricia B.  @ [facility] : CONTACT FOR D/C PLANNING  NEEDS AND HELP WITH PREFERRED PROVIDER . PHONE # [phone] X [phone] .

## 2023-12-13 NOTE — Progress Notes (Signed)
 NEUROLOGY CONSULT FOLLOW UP NOTE   Date of service: December 13, 2023 Patient Name: Patricia Patton MRN:  [id number] DOB:  [birth year]   Review of HPI  53 y.o. female with hx of recently diagnosed right sided trigeminal neuralgia as well as anxiety and depression who presented to the ED with worsening right sided facial pain and bilateral shoulder pain.  She was in her usual state of health until 2 months ago.  She had no pre-existing neurologic conditions other than numbness in her fingertips and her toes which had been previously diagnosed by outside neurologist as small fiber neuropathy.  She began to have severe paroxysmal facial pain in her right face initially around the V1 region and then spreading to V2 and V3.  The paroxysms of pain were crippling and 10 out of 10 in severity.  They would be triggered reliably by touching her face or brushing her hair.  She initially sought care at [facility] and they suspected that she may be developing shingles and that her rash had just not shown yet.  She was treated with Valtrex and increasing doses of gabapentin .  She never did develop a rash.  The trigeminal neuralgia-like pain then spread to the left V1 region.  The right sided pain was still with a paroxysmal component but was becoming near constant and she was unable to function.  Prior to admission she was taking gabapentin  900 mg 3 times daily and carbamazepine  100 mg 3 times daily without significant benefit.  She denied any other neurologic symptoms although she reported pain and mild weakness in her neck and shoulders.  On initial neurological exam at [facility] on  9/23, she had bilateral deltoid weakness and was lethargic secondary to narcotics administered in the ED.  MRI brain was performed which showed no significant intracranial abnormalities but did show abnormal T2 signal throughout the visualized cervical cord and abnormal enhancement along the right lateral and left posterolateral surfaces of the  upper cervical spinal cord most concerning for a transverse myelitis.   Interval Hx/subjective  Continued right trigeminal pain, rated as 7/10. Also has left forehead and scalp/hairline pain, rated as 6/10 currently. Received oxycodone  overnight.   Vitals   Vitals:   12/12/23 2354 12/13/23 0316 12/13/23 0800 12/13/23 0817  BP: 101/63 (!) 104/59 (!) 98/58   Pulse: 90 88 87   Resp: 18 18 18    Temp: 97.9 F (36.6 C) 97.7 F (36.5 C) 98.3 F (36.8 C)   TempSrc: Oral Oral Oral   SpO2: 98% 96% 93%   Weight:    59 kg  Height:         Body mass index is 23.79 kg/m.  Physical Exam   Constitutional: Appears well-developed and well-nourished.  Psych: Dysthymic affect; also with pained affect at times.   Eyes: No scleral injection.  HENT: No OP obstrucion.  Head: Normocephalic.  Respiratory: Effort normal, non-labored breathing.   Neurologic Examination   Mental Status: Awake, alert and oriented. Speech fluent with intact naming and comprehension.  Cranial Nerves: II: PERRL. Fixates and tracks normally.  Blurred vision improved since yesterday.  III,IV, VI: No ptosis. EOMI. No nystagmus.  V: Temp sensation decreased in right V1-3 distribution VII: Right facial weakness, lower quadrant (patient states this is chronic due to prior Bell's palsy about 10 years ago) VIII: Hearing intact to conversation IX,X: No hoarseness or hypophonia XI: Symmetric XII: Midline tongue extension Motor: Right :  Upper extremity   5/5  Left:     Upper extremity   5/5             Lower extremity   5/5                                                  Lower extremity   5/5 No pronator drift. Muscle tone and bulk is normal x 4.  Sensory: Temp sensation decreased to right face, arm and leg.  Deep Tendon Reflexes: 3+ and symmetric bilateral biceps and brachioradialis. 4+ bilateral patellae, 3+ bilateral achilles. Reflexes continue to be abnormally brisk. New finding of  sustained clonus with forced dorsiflexion at ankles bilaterally.   Cerebellar: No ataxia with FNF bilaterally. No tremor noted.  Gait: Deferred  Medications  Current Facility-Administered Medications:    acetaminophen  (TYLENOL ) tablet 650 mg, 650 mg, Oral, Q6H PRN, Samtani, Jai-Gurmukh, MD   artificial tears ophthalmic solution 2 drop, 2 drop, Both Eyes, Q2H, Samtani, Jai-Gurmukh, MD, 2 drop at 12/13/23 9178   carbamazepine  (TEGRETOL ) chewable tablet 100 mg, 100 mg, Oral, TID, Samtani, Jai-Gurmukh, MD, 100 mg at 12/13/23 9178   Chlorhexidine  Gluconate Cloth 2 % PADS 6 each, 6 each, Topical, Daily, Samtani, Jai-Gurmukh, MD   DULoxetine  (CYMBALTA ) DR capsule 60 mg, 60 mg, Oral, Daily, Samtani, Jai-Gurmukh, MD, 60 mg at 12/13/23 0820   gabapentin  (NEURONTIN ) capsule 900 mg, 900 mg, Oral, TID, Samtani, Jai-Gurmukh, MD, 900 mg at 12/13/23 0820   metroNIDAZOLE (METROGEL) 0.75 % gel, , Topical, BID, Samtani, Jai-Gurmukh, MD, Given at 12/13/23 9178   morphine (PF) 2 MG/ML injection 1 mg, 1 mg, Intravenous, Q2H PRN, Samtani, Jai-Gurmukh, MD, 1 mg at 12/12/23 1914   oxyCODONE  (Oxy IR/ROXICODONE ) immediate release tablet 5 mg, 5 mg, Oral, Q4H PRN, Samtani, Jai-Gurmukh, MD, 5 mg at 12/13/23 0820   polyethylene glycol (MIRALAX / GLYCOLAX) packet 17 g, 17 g, Oral, BID, Samtani, Jai-Gurmukh, MD, 17 g at 12/13/23 9177   senna (SENOKOT) tablet 8.6 mg, 1 tablet, Oral, QHS, Samtani, Jai-Gurmukh, MD, 8.6 mg at 12/12/23 2111   traZODone  (DESYREL ) tablet 150 mg, 150 mg, Oral, QHS, Samtani, Jai-Gurmukh, MD, 150 mg at 12/12/23 2112  Labs and Diagnostic Imaging   CBC:  Recent Labs  Lab 12/12/23 0200 12/13/23 0545  WBC 11.1* 8.9  NEUTROABS 6.4 5.1  HGB 12.4 12.3  HCT 37.3 36.5  MCV 90.5 89.5  PLT 121* 110*    Basic Metabolic Panel:  Lab Results  Component Value Date   NA 143 12/12/2023   K 3.9 12/12/2023   CO2 28 12/12/2023   GLUCOSE 109 (H) 12/12/2023   BUN 10 12/12/2023   CREATININE 0.83  12/12/2023   CALCIUM  8.7 (L) 12/12/2023   GFRNONAA >60 12/12/2023   GFRAA >60 09/30/2018   Lipid Panel: No results found for: LDLCALC HgbA1c: No results found for: HGBA1C Urine Drug Screen: No results found for: LABOPIA, COCAINSCRNUR, LABBENZ, AMPHETMU, THCU, LABBARB  Alcohol Level No results found for: [facility] INR  Lab Results  Component Value Date   INR 1.3 (H) 12/10/2023   APTT  Lab Results  Component Value Date   APTT 26 12/07/2023   Serum Autoimmune Myelopathy panel has resulted and is pan-negative: Anti-Hu Ab                     Negative  BN     Reference Range: Negative                              This test was developed and its performance characteristics  determined by [facility]. It has not been cleared or approved  by the Food and Drug Administration.  Anti-Ri Ab                     Negative                  BN     Reference Range: Negative                              This test was developed and its performance characteristics  determined by [facility]. It has not been cleared or approved  by the Food and Drug Administration.  Antineuronal nuclear Ab Type 3 Negative                  BN     Reference Range: Negative                              This test was developed and its performance characteristics  determined by [facility]. It has not been cleared or approved  by the Food and Drug Administration.  PCA Type-1 (Anti-Yo) Ab        Negative                  BN     Reference Range: Negative                              This test was developed and its performance characteristics  determined by [facility]. It has not been cleared or approved  by the Food and Drug Administration.  Purkinje Cell Cyto Ab Type 2   Negative                  BN     Reference Range: Negative                              This test was developed and its performance characteristics  determined by [facility]. It has not been cleared or approved  by the Food and Drug  Administration.  Purkinje Cell Cyto Ab Type Tr  Negative                  BN     Reference Range: Negative                              This test was developed and its performance characteristics  determined by [facility]. It has not been cleared or approved  by the Food and Drug Administration.  Amphiphysin Antibody           Negative                  BN     Reference Range: Negative                              This test was developed and  its performance characteristics  determined by [facility]. It has not been cleared or approved  by the Food and Drug Administration.  CRMP-5 IgG                     Negative                  BN     Reference Range: Negative                              This test was developed and its performance characteristics  determined by [facility]. It has not been cleared or approved  by the Food and Drug Administration.  CRMP-5 IgG Line Blot           Negative                  BN     Reference Range: Negative                              This test was developed and its performance characteristics  determined by [facility]. It has not been cleared or approved  by the Food and Drug Administration.  AGNA-1                         Negative                  BN     Reference Range: Negative                              This test was developed and its performance characteristics  determined by [facility]. It has not been cleared or approved  by the Food and Drug Administration.  DPPX Antibody                  Negative                  BN     Reference Range: Negative                              This test was developed and its performance characteristics  determined by [facility]. It has not been cleared or approved  by the Food and Drug Administration.  mGluR1 Antibody                Negative                  BN     Reference Range: Negative                              This test was developed and its performance characteristics  determined by [facility]. It has not been cleared or  approved  by the Food and Drug Administration.  AMPA-R1 Antibody               Negative                  BN     Reference Range: Negative  This test was developed and its performance characteristics  determined by [facility]. It has not been cleared or approved  by the Food and Drug Administration.  AMPA-R2 Antibody               Negative                  BN     Reference Range: Negative                              This test was developed and its performance characteristics  determined by [facility]. It has not been cleared or approved  by the Food and Drug Administration.  GABA-B-R Antibody              Negative                  BN     Reference Range: Negative                              This test was developed and its performance characteristics  determined by [facility]. It has not been cleared or approved  by the Food and Drug Administration.  NMDA-R Antibody                Negative                  BN     Reference Range: Negative                              This test was developed and its performance characteristics  determined by [facility]. It has not been cleared or approved  by the Food and Drug Administration.  GAD65 Antibody                 Negative                  BN     Reference Range: Negative                              This test was developed and its performance characteristics  determined by [facility]. It has not been cleared or approved  by the Food and Drug Administration.  Aquaporin 4 Antibody           Negative                  BN     Reference Range: Negative                                     Lab Results  Component Value Date    APTT 26 12/07/2023        CSF meningitis/encephalitis panel: Negative for all pathogens tested, including HSV1-2, VZV and HHV6  Assessment  Patricia Patton is a 53 y.o. female with history of idiopathic small fiber neuropathy, presenting with bilateral facial pain and blurred vision who was found  to have a longitudinally extensive transverse myelitis on MRI involving the grey matter tracts with H-shaped spinal cord hyperintensity at multiple axial levels, and patchy enhancement along the spinal cord external surface at the affected levels. Her symptoms began with severe trigeminal  neuralgia pain, which has been unremitting and resistant to medications.  - Exam today reveals decreased temperature sensation in right V1-3 distribution and chronic right facial weakness due to prior Bell's palsy. Has pathologically brisk lower extremity reflexes with sustained ankle clonus bilaterally. No limb weakness.  - Neuroimaging: - MRI orbits w/wo contrast: No evidence of acute or prior optic neuritis  - MRI brain w/wo contrast: No acute infarct. No acute intracranial hemorrhage. No mass effect or midline shift. No hydrocephalus. The sella is unremarkable. Normal flow voids. There is mild subcortical white matter disease present bilaterally - MRI C-spine w/wo contrast:  Abnormal T2 signal throughout the visualized cervical cord and abnormal enhancement along the right lateral and left posterolateral surfaces of the upper cervical spinal cord. The findings are concerning for demyelinating disease, but neoplasm cannot be excluded.  - MRI L-spine w/wo contrast: No spinal canal stenosis or neural foraminal narrowing in the lumbar spine.  - MRI T-spine w/wo contrast: No evidence of demyelinating disease involving the thoracic spinal cord.  - Repeat MRI orbits w/wo contrast (9/30): Question focal atrophy involving the distal left optic nerve just posterior to the left globe. No significant abnormal enhancement to suggest active or acute optic neuritis at this time. Otherwise unremarkable MRI of the orbits. - EKG: Sinus tachycardia; Otherwise normal ECG - Labs: - CSF meningitis/encephalitis panel: Negative for all pathogens tested, including HSV1-2, VZV and HHV6 - CSF with elevated WBC of 17, predominantly  lymphocytes. Elevated IgG index of 1.2. Total protein normal at 31.  - Serum Autoimmune Myelopathy panel has resulted and is pan-negative: - Differential diagnosis: - Although she has negative anti-MOG antibodies, her MRI findings are typical for this entity and MOGAD (myelin-oligodendrocyte glycoprotein antibody-associated disease) can present with acute trigeminal neuralgia as well, based on multiple case reports documented in the literature. Although anti-MOG Ab titers do have high sensitivity of about 90%, MOGAD is still relatively high on the differential despite her negative titer due to the clinical and imaging characteristics.  - Anti-aqp4 antibodies were also negative, which makes neuromyelitis optica much less likely given that she has no brain lesions and no symptoms or signs of optic neuritis.   - At this point, even if this is not MOGAD, out suspicion that this is some type of autoimmune process is very high and for this reason she has been started on plasmapheresis. - Her LP shows mild inflammation as would be expected with an inflammatory myelitis.  - CT chest abdomen pelvis looking for possible paraneoplastic association, in conjunction with colonoscopy, are negative for a mass lesion (possible apple-core lesion on CT was refuted by colonoscopy).   - Platelets have been trending downwards, now thrombocytopenic. Last normal platelet count was 203 on 9/28, subsequently 148 (9/29) >> 121 (9/30) >> 110 (10/1). DDx includes secondary to carbamazepine  versus a rare complication of PLEX.  - Complaining of recurrence of blurred vision on Tuesday. DDx includes due to NMO versus MOGAD versus secondary to headache pain. Her blurred vision was worse with either eye closed, better with binocular vision. Dr. Corbin of Ophthalmology ordered MRI orbits although received steroids so not really sure what else to do.  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 HSV can also be associated with transverse myelitis (herpes zoster myelitis), but CSF HSV PCR was negative.  - Severe trigeminal neuralgia of recent onset. Possible etiologies include autoimmune, versus secondary to HSV.   Recommendations  -  Follow-up CSF myelitis panel, Mayo test code MAC1 - Follow-up ANCA titers, ENA panel(includes SSA, SSBA among others) - Continue gabapentin  900 mg 3 times daily - Decreased carbamazepine  back to her home dosage regimen on Tuesday, as it has been ineffective at controlling her pain and may be causing her thrombocytopenia.  - Escalate opiate pain medication for her intractable trigeminal neuralgia pain - PLEX Tx #4 was planned for today. However, given the further drop in her platelet count to 110, would postpone this until platelets normalize. Would obtain daily 5 AM repeat CBC to track platelets.  - Continue to monitor her vision for any changes - Recommend ID consult to further assess the possibility of PCR-negative HSV infection as a potential etiology for her acute transverse myelitis and recent-onset of bilateral trigeminal neuralgia - Neurology will continue to follow ______________________________________________________________________   Bonney SHARK, Sarahbeth Cashin, MD [facility]

## 2023-12-13 NOTE — Consult Note (Signed)
 CC: No chief complaint on file.   HPI: Patricia Patton is a 53 y.o. female w/ PMH below who presents for evaluation of intractable facial pain in the setting of recently diagnosed trigeminal neuralgia.  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  The patient began to experience blurry vision in the right>left eyes a few days ago, which prompted an ophthalmology consult.  The patient reports the blurry vision is stable at this time.   ROS: Negative except as otherwise stated.  PMH: Past Medical History:  Diagnosis Date   Anxiety    Bell's palsy    Strep throat 2018    PSH: Past Surgical History:  Procedure Laterality Date   CESAREAN SECTION     FOOT SURGERY Bilateral    IR TUNNELED CENTRAL VENOUS CATH PLC W IMG  12/07/2023   TONSILLECTOMY      Meds: No current facility-administered medications on file prior to encounter.   Current Outpatient Medications on File Prior to Encounter  Medication Sig Dispense Refill   carbamazepine  (TEGRETOL ) 100 MG chewable tablet Chew 100 mg by mouth 3 (three) times daily.     DULoxetine  (CYMBALTA ) 60 MG capsule Take 60 mg by mouth daily.     gabapentin  (NEURONTIN ) 300 MG capsule Take 300 mg by mouth 3 (three) times daily.     meloxicam (MOBIC) 15 MG tablet Take 15 mg by mouth daily.     oxyCODONE -acetaminophen  (PERCOCET/ROXICET) 5-325 MG tablet Take 1 tablet by mouth every 8 (eight) hours as needed for severe pain (pain score 7-10) or moderate pain (pain score 4-6).     traZODone  (DESYREL ) 150 MG tablet Take 150 mg by mouth at bedtime.     valACYclovir (VALTREX) 1000 MG tablet Take 1,000 mg by mouth 3 (three) times daily.     methylPREDNISolone  sodium succinate 1,000 mg in sodium chloride  0.9 % 50 mL Inject 1,000 mg into the vein daily.     oxyCODONE  (OXY IR/ROXICODONE ) 5 MG immediate release tablet Take 5 mg by mouth every 6 (six) hours as needed. (Patient not  taking: Reported on 12/05/2023)     pantoprazole  (PROTONIX ) 40 MG injection Inject 40 mg into the vein daily.      SH: Social History   Socioeconomic History   Marital status: Married    Spouse name: Not on file   Number of children: Not on file   Years of education: Not on file   Highest education level: Not on file  Occupational History   Not on file  Tobacco Use   Smoking status: Former   Smokeless tobacco: Never  Substance and Sexual Activity   Alcohol use: Yes    Comment: occasional    Drug use: Yes    Types: Marijuana   Sexual activity: Not on file  Other Topics Concern   Not on file  Social History Narrative   Not on file   Social Drivers of Health   Financial Resource Strain: Low Risk  (07/30/2023)   Received from [facility]   Overall Financial Resource Strain (CARDIA)    Difficulty of Paying Living Expenses: Not hard at all  Food Insecurity: No Food Insecurity (12/06/2023)   Hunger Vital Sign    Worried About Running Out of Food in the Last Year: Never true    Ran Out of Food in the Last Year: Never true  Transportation Needs: No Transportation Needs (12/06/2023)   PRAPARE -  Administrator, Civil Service (Medical): No    Lack of Transportation (Non-Medical): No  Physical Activity: Insufficiently Active (08/13/2018)   Received from [facility]   Exercise Vital Sign    On average, how many days per week do you engage in moderate to strenuous exercise (like a brisk walk)?: 4 days    On average, how many minutes do you engage in exercise at this level?: 10 min  Stress: Not on file  Social Connections: Unknown (08/13/2018)   Received from [facility]   Social Connection and Isolation Panel    In a typical week, how many times do you talk on the phone with family, friends, or neighbors?: Three times a week    How often do you get together with friends or relatives?: Three times a week    Attends Religious Services: Not on file     Do you belong to any clubs or organizations such as church groups, unions, fraternal or athletic groups, or school groups?: No    How often do you attend meetings of the clubs or organizations you belong to?: Never    Are you married, widowed, divorced, separated, never married, or living with a partner?: Married    FH: Family History  Problem Relation Age of Onset   Diabetes Mother    Diabetes Father      Past Ocular History: none   Last Eye Exam: unknown   Primary Eye Care: unknown   Past Medical History:  Diagnosis Date   Anxiety    Bell's palsy    Strep throat 2018     Past Surgical History:  Procedure Laterality Date   CESAREAN SECTION     FOOT SURGERY Bilateral    IR TUNNELED CENTRAL VENOUS CATH PLC W IMG  12/07/2023   TONSILLECTOMY       Social History   Socioeconomic History   Marital status: Married    Spouse name: Not on file   Number of children: Not on file   Years of education: Not on file   Highest education level: Not on file  Occupational History   Not on file  Tobacco Use   Smoking status: Former   Smokeless tobacco: Never  Substance and Sexual Activity   Alcohol use: Yes    Comment: occasional    Drug use: Yes    Types: Marijuana   Sexual activity: Not on file  Other Topics Concern   Not on file  Social History Narrative   Not on file   Social Drivers of Health   Financial Resource Strain: Low Risk  (07/30/2023)   Received from [facility]   Overall Financial Resource Strain (CARDIA)    Difficulty of Paying Living Expenses: Not hard at all  Food Insecurity: No Food Insecurity (12/06/2023)   Hunger Vital Sign    Worried About Running Out of Food in the Last Year: Never true    Ran Out of Food in the Last Year: Never true  Transportation Needs: No Transportation Needs (12/06/2023)   PRAPARE - Administrator, Civil Service (Medical): No    Lack of Transportation (Non-Medical): No  Physical Activity:  Insufficiently Active (08/13/2018)   Received from [facility]   Exercise Vital Sign    On average, how many days per week do you engage in moderate to strenuous exercise (like a brisk walk)?: 4 days    On average, how many minutes do you engage in  exercise at this level?: 10 min  Stress: Not on file  Social Connections: Unknown (08/13/2018)   Received from [facility]   Social Connection and Isolation Panel    In a typical week, how many times do you talk on the phone with family, friends, or neighbors?: Three times a week    How often do you get together with friends or relatives?: Three times a week    Attends Religious Services: Not on file    Do you belong to any clubs or organizations such as church groups, unions, fraternal or athletic groups, or school groups?: No    How often do you attend meetings of the clubs or organizations you belong to?: Never    Are you married, widowed, divorced, separated, never married, or living with a partner?: Married  Intimate Partner Violence: Not At Risk (12/06/2023)   Humiliation, Afraid, Rape, and Kick questionnaire    Fear of Current or Ex-Partner: No    Emotionally Abused: No    Physically Abused: No    Sexually Abused: No     Allergies  Allergen Reactions   Amoxicillin-Pot Clavulanate Hives and Swelling    Specifically Augmentin      No current facility-administered medications on file prior to encounter.   Current Outpatient Medications on File Prior to Encounter  Medication Sig Dispense Refill   carbamazepine  (TEGRETOL ) 100 MG chewable tablet Chew 100 mg by mouth 3 (three) times daily.     DULoxetine  (CYMBALTA ) 60 MG capsule Take 60 mg by mouth daily.     gabapentin  (NEURONTIN ) 300 MG capsule Take 300 mg by mouth 3 (three) times daily.     meloxicam (MOBIC) 15 MG tablet Take 15 mg by mouth daily.     oxyCODONE -acetaminophen  (PERCOCET/ROXICET) 5-325 MG tablet Take 1 tablet by mouth every 8 (eight) hours as needed for severe  pain (pain score 7-10) or moderate pain (pain score 4-6).     traZODone  (DESYREL ) 150 MG tablet Take 150 mg by mouth at bedtime.     valACYclovir (VALTREX) 1000 MG tablet Take 1,000 mg by mouth 3 (three) times daily.     methylPREDNISolone  sodium succinate 1,000 mg in sodium chloride  0.9 % 50 mL Inject 1,000 mg into the vein daily.     oxyCODONE  (OXY IR/ROXICODONE ) 5 MG immediate release tablet Take 5 mg by mouth every 6 (six) hours as needed. (Patient not taking: Reported on 12/05/2023)     pantoprazole  (PROTONIX ) 40 MG injection Inject 40 mg into the vein daily.       ROS    Exam:  General: Awake, Alert, Oriented *3  Vision (near): with correction   OD: J5  OS: J6  Confrontational Field:   Full to count fingers, both eyes  Extraocular Motility:  Full ductions and versions, both eyes  Pupils  OD: 4mm to 3mm reactive without afferent pupillary defect (APD)  OS: 4mm to 3mm reactive without afferent pupillary defect (APD)   IOP(tonopen)  OD: 15  OS: 17  Slit Lamp Exam:  Lids/Lashes  OD: Normal Lids and lashes, No lesion or injury  OS: Normal lids and lashes, nor lesion or injury  Conjunctiva/Sclera  OD: White and quiet  OS: White and quiet  Cornea  OD: Clear without abrasion or defect  OS: Clear without abrasion or defect  Anterior Chamber  OD: Deep and quiet  OS: Deep and quiet  Iris  OD: Normal iris architecture  OS: Normal Iris Architecture   Lens  OD: Clear,  Without significant opacities  OS: Clear, Without significant opacities  Anterior Vitreous  OD: Clear, without cell  OS: Clear without cell   POSTERIOR POLE EXAM (Dilated with phenylephrine and tropicamide.  Dilation may last up to 24 hours)  View:   OD: 20/20 view without opacities  OS: 20/20 view without opacities  Vitreous:   OD: Clear, no cell  OS: Clear, no cell  Disc:   OD: flat, sharp margin, with appropriate color  OS: flat, sharp margin, with appropriate color  C:D Ratio:    OD: 0.3  OS: 0.3  Macula  OD: Flat, with appropriate light reflex  OS: Flat with appropriate light reflex  Vessels  OD: Normal vasculature  OS: Normal vasculature  Periphery  OD: Flat 360 degrees without tear, hole or detachment  OS: Flat 360 degrees without tear, hole or detachment  MRI Orbits w/wo 12/12/23 IMPRESSION: 1. Question focal atrophy involving the distal left optic nerve just posterior to the left globe. No significant abnormal enhancement to suggest active or acute optic neuritis at this time. 2. Otherwise unremarkable MRI of the orbits.   Assessment and Plan:   Blurry Vision -Pt with complex hospital course involving new onset trigeminal neuralgia with findings of transverse myelitis and now on plasmapheresis -After new onset blurry vision Ophthalmology was consulted.  Given the new transverse myelitis and MS/NMO spectrum high on the differential, there was concern for a new optic neuritis at this point.  Repeat MRI was performed which does not support this. -Pt complains of blurry vision which appears to be symmetric and mild at this point.  Notably no afferent pupillary defect is present.  Ophthalmic exam is benign -No evidence of optic neuritis or other ophthalmic pathology that requires urgent intervention -Unfortunately there are no findings on ophthalmic exam which would aid in diagnosis for this patient. - Consider artificial tears 3-4x daily to treat blurry vision. -We are happy to see patient after discharge for further evaluation of her blurry vision, should she still have it.   Stephanie Mcglone, MD Ophthalmology [facility] [phone]  Total Time Spent: 30 minutes

## 2023-12-13 NOTE — Progress Notes (Signed)
 PROGRESS NOTE    Patricia Patton  FMW:[id number] DOB: [birth year] DOA: 12/06/2023 PCP: Claudene Rayfield HERO, MD  No chief complaint on file.   Brief Narrative:   53 yo with hx R sided trigeminal neuralgia, anxiety, depression, and multiple other medical issues transferred from [facility], now being followed by neurology with concern for transverse myelitis.   Assessment & Plan:   Principal Problem:   Neuromyelitis optica spectrum disorder (HCC) Active Problems:   Anxiety and depression   Abnormal finding on GI tract imaging   Grade II internal hemorrhoids  Acute Transverse Myelitis  - MRI with abnormal increased T2 signal within the spinal cord extending from cervical medullary junction to the mid body of C6, with abnormal enhancement posterolaterally on the R at cervical medullary junction and posterolaterally on the L at C1 and C2 vertebral bodies c/w demyelinating disease - MRI T spine without demyelinating disease.  MRI L spine without spinal canal stenosis or neural foraminal narrowing in lumbar spine.  MRI orbits without evidence of acute or prior optic neuritis.  MRI brain with abnormal cervical findings noted concerning for demyelinating disease. - repeat MRI orbits with ? Focal atrophy of distal L optic nerve (no significant abnormal enhancement to suggest active or acute optic neuritis) - s/p LP with elevated WBC count, lymph predominant.  Protein 31, Glucose 83.  Negative meningitis/encephalitis panel.  Pending oligoclonal bands. - negative autoimmune myelopathy panel - negative neuromyelitis optica ab - negative aqp4 ab and mog ab - negative antiextractable nuclear ag, negative anca, negative ANA - appreciate neurology recs - follow CSF myelitis panel, mayo test code MAC1.  Gabapentin  900 mg TID.  Carbamazepine  decreased to home regimen.  PLEX per neurology (on hold for thrombocytopenia today).  ID c/s for possible PCR negatiev HSV infection.   - ophtho saw and exam benign  without evdience of optic neuritis or other pathology requiring urgent intervention  Trigeminal Neuralgia - appreciate neurology assistance - continuing tegretol , gabapentin   Acne - started on flagyl cream  Irregular Bowel Wall thickening concerning for malignancy - s/p colonoscopy which was normal  Depression  Anxiety - cymbalta , trazodone   Small Fiber Neuropathy - continue cymbalta , gabapentin     DVT prophylaxis: SCD Code Status: full Family Communication: none Disposition:   Status is: Inpatient Remains inpatient appropriate because: need for continued inpatient care   Consultants:  Neurology GI ID Ophtho   Procedures:  Colonoscopy - The entire examined colon is normal. - Ascending colon was normal- appearing on anterograde and retroflexed views. Made several passes through the right colon and no pathology noted. - Non- bleeding internal hemorrhoids. - The examined portion of the ileum was normal. - No specimens collected. Recommendation: - Return patient to hospital ward for ongoing care. - Advance diet as tolerated. - Continue present medications. - Repeat colonoscopy in 10 years for screening purposes. - Return to GI office PRN. - Inpatient GI service will sign off at this time. Please do not hesitate to contact us  with additional questions or concerns.  Antimicrobials:  Anti-infectives (From admission, onward)    None       Subjective: C/o R face tingling/pain and electric discomfort to head  Objective: Vitals:   12/13/23 0800 12/13/23 0817 12/13/23 1144 12/13/23 1619  BP: (!) 98/58  98/71 106/83  Pulse: 87  92 98  Resp: 18  18 17   Temp: 98.3 F (36.8 C)  (!) 97.5 F (36.4 C) 98 F (36.7 C)  TempSrc: Oral  Oral Oral  SpO2:  93%  94% 92%  Weight:  59 kg    Height:        Intake/Output Summary (Last 24 hours) at 12/13/2023 2007 Last data filed at 12/12/2023 2200 Gross per 24 hour  Intake 60 ml  Output --  Net 60 ml   Filed Weights   12/09/23  1101 12/11/23 1016 12/13/23 0817  Weight: 60.8 kg 62.6 kg 59 kg    Examination:  General exam: Appears calm and comfortable  Respiratory system: unlabored Cardiovascular system: RRR Gastrointestinal system: Abdomen is nondistended, soft and nontender.  Central nervous system: Alert and oriented. No focal neurological deficits. Extremities: no LEE   Data Reviewed: I have personally reviewed following labs and imaging studies  CBC: Recent Labs  Lab 12/09/23 0435 12/10/23 0515 12/11/23 0443 12/12/23 0200 12/13/23 0545  WBC 10.8* 8.8 9.3 11.1* 8.9  NEUTROABS 7.6 7.0 4.7 6.4 5.1  HGB 11.9* 12.0 11.7* 12.4 12.3  HCT 34.9* 35.1* 34.6* 37.3 36.5  MCV 87.5 87.5 89.2 90.5 89.5  PLT 238 203 148* 121* 110*    Basic Metabolic Panel: Recent Labs  Lab 12/07/23 0500 12/09/23 0435 12/10/23 0515 12/11/23 0443 12/12/23 0200  NA 141 140 143 141 143  K 3.9 3.6 3.7 3.1* 3.9  CL 106 102 108 106 102  CO2 26 27 25 27 28   GLUCOSE 123* 115* 153* 86 109*  BUN 16 16 13 14 10   CREATININE 0.74 0.83 0.73 0.75 0.83  CALCIUM  9.1 8.8* 8.8* 8.2* 8.7*    GFR: Estimated Creatinine Clearance: 62.7 mL/min (by C-G formula based on SCr of 0.83 mg/dL).  Liver Function Tests: Recent Labs  Lab 12/07/23 0500 12/08/23 1331  AST 18  --   ALT 27  --   ALKPHOS 50  --   BILITOT 0.5  --   PROT 6.4*  --   ALBUMIN  3.6 4.3    CBG: Recent Labs  Lab 12/12/23 0133  GLUCAP 76     Recent Results (from the past 240 hours)  CSF culture w Gram Stain     Status: None   Collection Time: 12/08/23  1:31 PM   Specimen: CSF; Cerebrospinal Fluid  Result Value Ref Range Status   Specimen Description CSF  Final   Special Requests NONE  Final   Gram Stain   Final    WBC PRESENT, PREDOMINANTLY MONONUCLEAR NO ORGANISMS SEEN CYTOSPIN SMEAR NO RBC SEEN    Culture   Final    NO GROWTH 3 DAYS Performed at [facility], [address], [place], [place] [address]    Report Status 12/11/2023 FINAL   Final         Radiology Studies: MR ORBITS W WO CONTRAST Result Date: 12/12/2023 CLINICAL DATA:  Initial evaluation for endo most spectrum disorder. EXAM: MRI OF THE ORBITS WITHOUT AND WITH CONTRAST TECHNIQUE: Multiplanar, multi-echo pulse sequences of the orbits and surrounding structures were acquired including fat saturation techniques, before and after intravenous contrast administration. CONTRAST:  6mL GADAVIST  GADOBUTROL  1 MMOL/ML IV SOLN COMPARISON:  MRI from 12/06/2023 FINDINGS: Orbits:  Examination degraded by motion artifact. Globes are symmetric in size with normal appearance and morphology bilaterally. There is question of focal atrophy involving the distal left optic nerve just posterior to the left globe (series 4, image 17). No significant abnormal enhancement is visible to suggest active or acute optic neuritis, although evaluation limited by motion. Right optic nerve normal in appearance. Intraconal and extraconal fat maintained. Extra-ocular muscles symmetric and within normal  limits. No abnormality about the orbital apices or cavernous sinus. Optic chiasm normally situated within the suprasellar cistern. Lacrimal glands within normal limits. Visualized sinuses: Minor mucoperiosteal thickening present about the ethmoidal air cells. Paranasal sinuses are otherwise clear. Soft tissues: No visible periorbital soft tissue swelling. Limited intracranial: Unremarkable. IMPRESSION: 1. Question focal atrophy involving the distal left optic nerve just posterior to the left globe. No significant abnormal enhancement to suggest active or acute optic neuritis at this time. 2. Otherwise unremarkable MRI of the orbits. Electronically Signed   By: Morene Hoard M.D.   On: 12/12/2023 20:44        Scheduled Meds:  artificial tears  2 drop Both Eyes Q2H   carbamazepine   100 mg Oral TID   Chlorhexidine  Gluconate Cloth  6 each Topical Daily   DULoxetine   60 mg Oral Daily   gabapentin   900  mg Oral TID   metroNIDAZOLE   Topical BID   polyethylene glycol  17 g Oral BID   senna  1 tablet Oral QHS   traZODone   150 mg Oral QHS   Continuous Infusions:   LOS: 7 days    Time spent: over 30 min     Meliton Monte, MD [facility]   To contact the attending provider between 7A-7P or the covering provider during after hours 7P-7A, please log into the web site [email or web address] and access using universal [place] password for that web site. If you do not have the password, please call the hospital operator.  12/13/2023, 8:07 PM

## 2023-12-14 ENCOUNTER — Encounter (HOSPITAL_COMMUNITY): Payer: Self-pay | Admitting: Gastroenterology

## 2023-12-14 LAB — CBC WITH DIFFERENTIAL/PLATELET
Abs Immature Granulocytes: 0.1 K/uL — ABNORMAL HIGH (ref 0.00–0.07)
Basophils Absolute: 0 K/uL (ref 0.0–0.1)
Basophils Relative: 1 %
Eosinophils Absolute: 0.2 K/uL (ref 0.0–0.5)
Eosinophils Relative: 4 %
HCT: 36.4 % (ref 36.0–46.0)
Hemoglobin: 11.9 g/dL — ABNORMAL LOW (ref 12.0–15.0)
Immature Granulocytes: 1 %
Lymphocytes Relative: 21 %
Lymphs Abs: 1.5 K/uL (ref 0.7–4.0)
MCH: 29.9 pg (ref 26.0–34.0)
MCHC: 32.7 g/dL (ref 30.0–36.0)
MCV: 91.5 fL (ref 80.0–100.0)
Monocytes Absolute: 0.7 K/uL (ref 0.1–1.0)
Monocytes Relative: 10 %
Neutro Abs: 4.4 K/uL (ref 1.7–7.7)
Neutrophils Relative %: 63 %
Platelets: 123 K/uL — ABNORMAL LOW (ref 150–400)
RBC: 3.98 MIL/uL (ref 3.87–5.11)
RDW: 12.3 % (ref 11.5–15.5)
WBC: 6.9 K/uL (ref 4.0–10.5)
nRBC: 0 % (ref 0.0–0.2)

## 2023-12-14 MED ORDER — OXYCODONE HCL 5 MG PO TABS
10.0000 mg | ORAL_TABLET | ORAL | Status: DC | PRN
  Administered 2023-12-14 – 2023-12-20 (×17): 10 mg via ORAL
  Filled 2023-12-14 (×18): qty 2

## 2023-12-14 MED ORDER — OXYCODONE HCL 5 MG PO TABS
5.0000 mg | ORAL_TABLET | ORAL | Status: DC | PRN
Start: 2023-12-14 — End: 2023-12-21

## 2023-12-14 MED ORDER — POLYVINYL ALCOHOL 1.4 % OP SOLN
2.0000 [drp] | Freq: Four times a day (QID) | OPHTHALMIC | Status: DC
  Administered 2023-12-14 – 2023-12-21 (×19): 2 [drp] via OPHTHALMIC
  Filled 2023-12-14: qty 15

## 2023-12-14 MED ORDER — ACETAMINOPHEN 500 MG PO TABS
1000.0000 mg | ORAL_TABLET | Freq: Three times a day (TID) | ORAL | Status: AC
  Administered 2023-12-14 – 2023-12-19 (×15): 1000 mg via ORAL
  Filled 2023-12-14 (×15): qty 2

## 2023-12-14 MED ORDER — ACETAMINOPHEN 325 MG PO TABS: 650.0000 mg | ORAL_TABLET | Freq: Four times a day (QID) | ORAL | Status: DC | PRN

## 2023-12-14 NOTE — TOC Progression Note (Signed)
 Transition of Care [facility]) - Progression Note    Patient Details  Name: Wilbur Labuda MRN: [id number] Date of Birth: 03/04/1971  [facility] CM/SW Contact  Andrez JULIANNA George, RN Phone Number: 12/14/2023, 10:14 AM  Clinical Narrative:     Pt continues with PLEX.  IP Care management following.  Expected Discharge Plan: Home/Self Care Barriers to Discharge: Continued Medical Work up               Expected Discharge Plan and Services   Discharge Planning Services: CM Consult   Living arrangements for the past 2 months: Single Family Home                                       Social Drivers of Health (SDOH) Interventions SDOH Screenings   Food Insecurity: No Food Insecurity (12/06/2023)  Housing: Low Risk  (12/06/2023)  Transportation Needs: No Transportation Needs (12/06/2023)  Utilities: Not At Risk (12/06/2023)  Financial Resource Strain: Low Risk  (07/30/2023)   Received from [facility]  Physical Activity: Insufficiently Active (08/13/2018)   Received from [facility]  Social Connections: Unknown (08/13/2018)   Received from [facility]  Tobacco Use: Medium Risk (12/12/2023)  Health Literacy: Low Risk  (06/20/2020)   Received from [facility]    Readmission Risk Interventions     No data to display

## 2023-12-14 NOTE — Progress Notes (Signed)
 IV Team consult placed for PIV.  Pt has HDC with pigtail.  Flushes easily and has GBR.  Dressing changed per policy.  Notified nursing and pt to utilize the 3rd access same as CVC, DBIV and meds.

## 2023-12-14 NOTE — Progress Notes (Addendum)
 NEUROLOGY CONSULT FOLLOW UP NOTE   Date of service: December 14, 2023 Patient Name: Patricia Patton MRN:  [id number] DOB:  [birth year]    Review of HPI  53 y.o. female with hx of recently diagnosed right sided trigeminal neuralgia as well as anxiety and depression who presented to the ED with worsening right sided facial pain and bilateral shoulder pain.  She was in her usual state of health until 2 months ago.  She had no pre-existing neurologic conditions other than numbness in her fingertips and her toes which had been previously diagnosed by outside neurologist as small fiber neuropathy.  She began to have severe paroxysmal facial pain in her right face initially around the V1 region and then spreading to V2 and V3.  The paroxysms of pain were crippling and 10 out of 10 in severity.  They would be triggered reliably by touching her face or brushing her hair.  She initially sought care at [facility] and they suspected that she may be developing shingles and that her rash had just not shown yet.  She was treated with Valtrex and increasing doses of gabapentin .  She never did develop a rash.  The trigeminal neuralgia-like pain then spread to the left V1 region.  The right sided pain was still with a paroxysmal component but was becoming near constant and she was unable to function.  Prior to admission she was taking gabapentin  900 mg 3 times daily and carbamazepine  100 mg 3 times daily without significant benefit.  She denied any other neurologic symptoms although she reported pain and mild weakness in her neck and shoulders.  On initial neurological exam at [facility] on  9/23, she had bilateral deltoid weakness and was lethargic secondary to narcotics administered in the ED.  MRI brain was performed which showed no significant intracranial abnormalities but did show abnormal T2 signal throughout the visualized cervical cord and abnormal enhancement along the right lateral and left posterolateral surfaces of the  upper cervical spinal cord most concerning for a transverse myelitis.   Interval Hx/subjective  Continued head pain, but left trigeminal pain now resolved. Has headache to top of head rated as 10/10 currently, that got somewhat better last night with sleep meds and oxycodone . Right trigeminal pain now rated as 6/10.   Vitals   Vitals:   12/13/23 2136 12/13/23 2329 12/14/23 0426 12/14/23 0824  BP:  (!) 95/57 94/60 95/66   Pulse:  84 86 87  Resp:  18 18 18   Temp: 98.6 F (37 C) 97.9 F (36.6 C) 98.4 F (36.9 C) 97.7 F (36.5 C)  TempSrc: Oral Oral Oral Oral  SpO2:  95% 95% 95%  Weight:      Height:         Body mass index is 23.79 kg/m.  Physical Exam   Constitutional: Appears well-developed and well-nourished.  Psych: Dysthymic affect; also with pained affect at times. Improved compared with yesterday.  Eyes: No scleral injection.  HENT: No OP obstrucion.  Head: Normocephalic.  Respiratory: Effort normal, non-labored breathing.   Neurologic Examination   Mental Status: Awake, alert and oriented. Speech fluent with intact naming and comprehension.  Cranial Nerves: II: PERRL. Fixates and tracks normally, but with subtle jerky quality to visual pursuits.    III,IV, VI: No ptosis. EOMI. No nystagmus.  V: Temp sensation normal in right V1-3 distribution, but with some pain triggered by cold stimulus to V2 VII: Subtle right facial weakness, lower quadrant (patient states this is chronic due to prior  Bell's palsy about 10 years ago) VIII: Hearing intact to conversation IX,X: No hoarseness or hypophonia XI: Symmetric XII: Midline tongue extension Motor: Right :  Upper extremity   5/5                                      Left:     Upper extremity   5/5             Lower extremity   5/5                                                  Lower extremity   5/5 No pronator drift. Muscle tone and bulk is normal x 4.  Sensory: Temp sensation decreased to right face, arm and leg.   Deep Tendon Reflexes: 2+ and symmetric bilateral biceps and brachioradialis. 3+ bilateral patellae, 2+ bilateral achilles. Reflexes improved from yesterday. Resolution of sustained clonus with forced dorsiflexion at ankles bilaterally, now with just 3 beats on left and right.   Cerebellar: No ataxia with FNF bilaterally. No tremor noted.  Gait: Deferred  Medications  Current Facility-Administered Medications:    acetaminophen  (TYLENOL ) tablet 650 mg, 650 mg, Oral, Q6H PRN, Samtani, Jai-Gurmukh, MD   artificial tears ophthalmic solution 2 drop, 2 drop, Both Eyes, Q2H, Samtani, Jai-Gurmukh, MD, 2 drop at 12/14/23 0844   carbamazepine  (TEGRETOL ) chewable tablet 100 mg, 100 mg, Oral, TID, Samtani, Jai-Gurmukh, MD, 100 mg at 12/14/23 0847   Chlorhexidine  Gluconate Cloth 2 % PADS 6 each, 6 each, Topical, Daily, Samtani, Jai-Gurmukh, MD, 6 each at 12/13/23 2209   diphenhydrAMINE  (BENADRYL ) capsule 25 mg, 25 mg, Oral, Q6H PRN, Perri DELENA Meliton Mickey., MD, 25 mg at 12/14/23 9146   DULoxetine  (CYMBALTA ) DR capsule 60 mg, 60 mg, Oral, Daily, Samtani, Jai-Gurmukh, MD, 60 mg at 12/14/23 0847   gabapentin  (NEURONTIN ) capsule 900 mg, 900 mg, Oral, TID, Samtani, Jai-Gurmukh, MD, 900 mg at 12/14/23 0846   metroNIDAZOLE (METROGEL) 0.75 % gel, , Topical, BID, Samtani, Jai-Gurmukh, MD, Given at 12/14/23 1036   morphine (PF) 2 MG/ML injection 1 mg, 1 mg, Intravenous, Q2H PRN, Samtani, Jai-Gurmukh, MD, 1 mg at 12/14/23 1032   oxyCODONE  (Oxy IR/ROXICODONE ) immediate release tablet 5 mg, 5 mg, Oral, Q4H PRN, Samtani, Jai-Gurmukh, MD, 5 mg at 12/14/23 0853   polyethylene glycol (MIRALAX / GLYCOLAX) packet 17 g, 17 g, Oral, BID, Samtani, Jai-Gurmukh, MD, 17 g at 12/14/23 0846   senna (SENOKOT) tablet 8.6 mg, 1 tablet, Oral, QHS, Samtani, Jai-Gurmukh, MD, 8.6 mg at 12/13/23 2205   traZODone  (DESYREL ) tablet 150 mg, 150 mg, Oral, QHS, Samtani, Jai-Gurmukh, MD, 150 mg at 12/13/23 2205  Labs and Diagnostic Imaging   CBC:   Recent Labs  Lab 12/12/23 0200 12/13/23 0545  WBC 11.1* 8.9  NEUTROABS 6.4 5.1  HGB 12.4 12.3  HCT 37.3 36.5  MCV 90.5 89.5  PLT 121* 110*    Basic Metabolic Panel:  Lab Results  Component Value Date   NA 143 12/12/2023   K 3.9 12/12/2023   CO2 28 12/12/2023   GLUCOSE 109 (H) 12/12/2023   BUN 10 12/12/2023   CREATININE 0.83 12/12/2023   CALCIUM  8.7 (L) 12/12/2023   GFRNONAA >60 12/12/2023   GFRAA >60 09/30/2018   Lipid Panel:  No results found for: LDLCALC HgbA1c: No results found for: HGBA1C Urine Drug Screen: No results found for: LABOPIA, COCAINSCRNUR, LABBENZ, AMPHETMU, THCU, LABBARB  Alcohol Level No results found for: [facility] INR  Lab Results  Component Value Date   INR 1.3 (H) 12/10/2023   APTT  Lab Results  Component Value Date   APTT 26 12/07/2023   Serum Autoimmune Myelopathy panel has resulted and is pan-negative: Anti-Hu Ab                     Negative                  BN     Reference Range: Negative                              This test was developed and its performance characteristics  determined by [facility]. It has not been cleared or approved  by the Food and Drug Administration.  Anti-Ri Ab                     Negative                  BN     Reference Range: Negative                              This test was developed and its performance characteristics  determined by [facility]. It has not been cleared or approved  by the Food and Drug Administration.  Antineuronal nuclear Ab Type 3 Negative                  BN     Reference Range: Negative                              This test was developed and its performance characteristics  determined by [facility]. It has not been cleared or approved  by the Food and Drug Administration.  PCA Type-1 (Anti-Yo) Ab        Negative                  BN     Reference Range: Negative                              This test was developed and its performance characteristics  determined by [facility]. It  has not been cleared or approved  by the Food and Drug Administration.  Purkinje Cell Cyto Ab Type 2   Negative                  BN     Reference Range: Negative                              This test was developed and its performance characteristics  determined by [facility]. It has not been cleared or approved  by the Food and Drug Administration.  Purkinje Cell Cyto Ab Type Tr  Negative                  BN     Reference Range: Negative  This test was developed and its performance characteristics  determined by [facility]. It has not been cleared or approved  by the Food and Drug Administration.  Amphiphysin Antibody           Negative                  BN     Reference Range: Negative                              This test was developed and its performance characteristics  determined by [facility]. It has not been cleared or approved  by the Food and Drug Administration.  CRMP-5 IgG                     Negative                  BN     Reference Range: Negative                              This test was developed and its performance characteristics  determined by [facility]. It has not been cleared or approved  by the Food and Drug Administration.  CRMP-5 IgG Line Blot           Negative                  BN     Reference Range: Negative                              This test was developed and its performance characteristics  determined by [facility]. It has not been cleared or approved  by the Food and Drug Administration.  AGNA-1                         Negative                  BN     Reference Range: Negative                              This test was developed and its performance characteristics  determined by [facility]. It has not been cleared or approved  by the Food and Drug Administration.  DPPX Antibody                  Negative                  BN     Reference Range: Negative                              This test was developed and its performance  characteristics  determined by [facility]. It has not been cleared or approved  by the Food and Drug Administration.  mGluR1 Antibody                Negative                  BN     Reference Range: Negative  This test was developed and its performance characteristics  determined by [facility]. It has not been cleared or approved  by the Food and Drug Administration.  AMPA-R1 Antibody               Negative                  BN     Reference Range: Negative                              This test was developed and its performance characteristics  determined by [facility]. It has not been cleared or approved  by the Food and Drug Administration.  AMPA-R2 Antibody               Negative                  BN     Reference Range: Negative                              This test was developed and its performance characteristics  determined by [facility]. It has not been cleared or approved  by the Food and Drug Administration.  GABA-B-R Antibody              Negative                  BN     Reference Range: Negative                              This test was developed and its performance characteristics  determined by [facility]. It has not been cleared or approved  by the Food and Drug Administration.  NMDA-R Antibody                Negative                  BN     Reference Range: Negative                              This test was developed and its performance characteristics  determined by [facility]. It has not been cleared or approved  by the Food and Drug Administration.  GAD65 Antibody                 Negative                  BN     Reference Range: Negative                              This test was developed and its performance characteristics  determined by [facility]. It has not been cleared or approved  by the Food and Drug Administration.  Aquaporin 4 Antibody           Negative                  BN     Reference Range: Negative                       CSF  meningitis/encephalitis panel: Negative for all pathogens tested, including HSV1-2, VZV and HHV6  Assessment  Rexene Mering [facility]  is a 53 y.o. 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 Her symptoms began with severe trigeminal neuralgia pain, which has been unremitting and resistant to medications.  - Exam today is as documented above. Reflexes less brisk than yesterday. No limb weakness.  - Neuroimaging: - MRI orbits w/wo contrast: No evidence of acute or prior optic neuritis  - MRI brain w/wo contrast: No acute infarct. No acute intracranial hemorrhage. No mass effect or midline shift. No hydrocephalus. The sella is unremarkable. Normal flow voids. There is mild subcortical white matter disease present bilaterally - MRI C-spine w/wo contrast:  Abnormal T2 signal throughout the visualized cervical cord and abnormal enhancement along the right lateral and left posterolateral surfaces of the upper cervical spinal cord. The findings are concerning for demyelinating disease, but neoplasm cannot be excluded.  - MRI L-spine w/wo contrast: No spinal canal stenosis or neural foraminal narrowing in the lumbar spine.  - MRI T-spine w/wo contrast: No evidence of demyelinating disease involving the thoracic spinal cord.  - Repeat MRI orbits w/wo contrast (9/30): Question focal atrophy involving the distal left optic nerve just posterior to the left globe. No significant abnormal enhancement to suggest active or acute optic neuritis at this time. Otherwise unremarkable MRI of the orbits. - EKG: Sinus tachycardia; Otherwise normal ECG - Labs: - CSF meningitis/encephalitis panel:  Negative for all pathogens tested, including HSV1-2, VZV and HHV6 - CSF with elevated WBC of 17, predominantly lymphocytes. Elevated IgG index of 1.2. Total protein normal at 31.  - Serum Autoimmune Myelopathy panel has resulted and is pan-negative: - Differential diagnosis: - Although she has negative anti-MOG antibodies, her MRI findings are typical for this entity and MOGAD (myelin-oligodendrocyte glycoprotein antibody-associated disease) can present with acute trigeminal neuralgia as well, based on multiple case reports documented in the literature. Although anti-MOG Ab titers do have high sensitivity of about 90%, MOGAD is still relatively high on the differential despite her negative titer due to the clinical and imaging characteristics.  - Anti-aqp4 antibodies were also negative, which makes neuromyelitis optica much less likely given that she has no brain lesions and no symptoms or signs of optic neuritis.   - At this point, even if this is not MOGAD, out suspicion that this is some type of autoimmune process is very high and for this reason she has been started on plasmapheresis. - Her LP shows mild inflammation as would be expected with an inflammatory myelitis.  - ID saw the patient on 10/1 and feel that it is unlikely that the etiology of all of her neuro findings are secondary to HSV. They have called [facility] and have added on HSV1 and 2 IgG to her CSF fluid that had already been sent out. They do not recommend starting anti virals at this time.  - CT chest abdomen pelvis looking for possible paraneoplastic association, in conjunction with colonoscopy, are negative for a mass lesion (possible apple-core lesion on CT was refuted by colonoscopy).   - Platelets have been trending downwards, now thrombocytopenic. Last normal platelet count was 203 on 9/28, subsequently 148 (9/29) >> 121 (9/30) >> 110 (10/1). DDx includes secondary to carbamazepine  versus a rare complication of PLEX.  - Complaining  of recurrence  of blurred vision on Tuesday. DDx includes due to NMO versus MOGAD versus secondary to headache pain. Her blurred vision was worse with either eye closed, better with binocular vision. Dr. Corbin of Ophthalmology ordered MRI orbits although received steroids so not really sure what else to do.  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 HSV can also be associated with transverse myelitis (herpes zoster myelitis), but CSF HSV PCR was negative.  - Severe trigeminal neuralgia of recent onset. Possible etiologies include autoimmune, versus secondary to HSV (although CSF HSV PCR was negative).   Recommendations  - Follow-up CSF myelitis panel, Mayo test code MAC1 - Follow-up ANCA titers, ENA panel(includes SSA, SSBA among others) - Continue gabapentin  900 mg 3 times daily - Decreased carbamazepine  back to her home dosage regimen on Tuesday, as it has been ineffective at controlling her pain and may be causing her thrombocytopenia.  - Escalate opiate pain medication for her intractable trigeminal neuralgia pain - PLEX Tx #4 was planned for today. However, given the further drop in her platelet count to 110, would postpone this until platelets normalize. Today's platelet level is pending. Ordering a CBC for Friday at 5 AM.  - Continue to monitor her vision for any changes - After PLEX #5, will need to obtain repeat MRIs of the brain and C-spine w/wo contrast - Neurology will continue to follow ______________________________________________________________________   Bonney SHARK, Talyah Seder, MD [facility]

## 2023-12-14 NOTE — Progress Notes (Signed)
 PROGRESS NOTE    Patricia Patton  FMW:[id number] DOB: 05/16/1970 DOA: 12/06/2023 PCP: Claudene Rayfield HERO, MD  No chief complaint on file.   Brief Narrative:   53 yo with hx R sided trigeminal neuralgia, anxiety, depression, and multiple other medical issues transferred from [facility], now being followed by neurology with concern for transverse myelitis.   Assessment & Plan:   Principal Problem:   Neuromyelitis optica spectrum disorder (HCC) Active Problems:   Anxiety and depression   Abnormal finding on GI tract imaging   Grade II internal hemorrhoids  Acute Transverse Myelitis  - MRI with abnormal increased T2 signal within the spinal cord extending from cervical medullary junction to the mid body of C6, with abnormal enhancement posterolaterally on the R at cervical medullary junction and posterolaterally on the L at C1 and C2 vertebral bodies c/w demyelinating disease - MRI T spine without demyelinating disease.  MRI L spine without spinal canal stenosis or neural foraminal narrowing in lumbar spine.  MRI orbits without evidence of acute or prior optic neuritis.  MRI brain with abnormal cervical findings noted concerning for demyelinating disease. - repeat MRI orbits with ? Focal atrophy of distal L optic nerve (no significant abnormal enhancement to suggest active or acute optic neuritis) - s/p LP with elevated WBC count, lymph predominant.  Protein 31, Glucose 83.  Negative meningitis/encephalitis panel.  Pending oligoclonal bands. - negative autoimmune myelopathy panel - negative neuromyelitis optica ab - negative aqp4 ab and mog ab - negative antiextractable nuclear ag, negative anca, negative ANA - appreciate neurology recs - follow CSF myelitis panel, mayo test code MAC1.  Gabapentin  900 mg TID.  Carbamazepine  decreased to home regimen.  PLEX per neurology (on hold for thrombocytopenia).  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   - ophtho saw and exam benign without evdience of optic neuritis or other pathology requiring urgent intervention  Trigeminal Neuralgia - appreciate neurology assistance - continuing tegretol , gabapentin   Acne - started on flagyl cream  Irregular Bowel Wall thickening concerning for malignancy - s/p colonoscopy which was normal  Depression  Anxiety - cymbalta , trazodone   Small Fiber Neuropathy - continue cymbalta , gabapentin     DVT prophylaxis: SCD Code Status: full Family Communication: none Disposition:   Status is: Inpatient Remains inpatient appropriate because: need for continued inpatient care   Consultants:  Neurology GI ID Ophtho   Procedures:  Colonoscopy - The entire examined colon is normal. - Ascending colon was normal- appearing on anterograde and retroflexed views. Made several passes through the right colon and no pathology noted. - Non- bleeding internal hemorrhoids. - The examined portion of the ileum was normal. - No specimens collected. Recommendation: - Return patient to hospital ward for ongoing care. - Advance diet as tolerated. - Continue present medications. - Repeat colonoscopy in 10 years for screening purposes. - Return to GI office PRN. - Inpatient GI service will sign off at this time. Please do not hesitate to contact us  with additional questions or concerns.  Antimicrobials:  Anti-infectives (From admission, onward)    None       Subjective: C/o neck and face pain   Objective: Vitals:   12/13/23 2329 12/14/23 0426 12/14/23 0824 12/14/23 1107  BP: (!) 95/57 94/60 95/66  94/61  Pulse: 84 86 87 88  Resp: 18 18 18 18   Temp: 97.9 F (36.6 C) 98.4 F (36.9 C) 97.7 F (36.5 C) 98.4 F (  36.9 C)  TempSrc: Oral Oral Oral Oral  SpO2: 95% 95% 95% 93%  Weight:      Height:       No intake or output data in the 24 hours ending 12/14/23 1446  Filed Weights   12/09/23 1101 12/11/23 1016 12/13/23 0817   Weight: 60.8 kg 62.6 kg 59 kg    Examination:  General: No acute distress. Sitting with her head still due to pain with minimal movement. Lungs: unlabored Neurological: Alert and oriented 3. Moves all extremities 4 with equal strength. Extremities: No clubbing or cyanosis. No edema.  Data Reviewed: I have personally reviewed following labs and imaging studies  CBC: Recent Labs  Lab 12/10/23 0515 12/11/23 0443 12/12/23 0200 12/13/23 0545 12/14/23 0944  WBC 8.8 9.3 11.1* 8.9 6.9  NEUTROABS 7.0 4.7 6.4 5.1 4.4  HGB 12.0 11.7* 12.4 12.3 11.9*  HCT 35.1* 34.6* 37.3 36.5 36.4  MCV 87.5 89.2 90.5 89.5 91.5  PLT 203 148* 121* 110* 123*    Basic Metabolic Panel: Recent Labs  Lab 12/09/23 0435 12/10/23 0515 12/11/23 0443 12/12/23 0200  NA 140 143 141 143  K 3.6 3.7 3.1* 3.9  CL 102 108 106 102  CO2 27 25 27 28   GLUCOSE 115* 153* 86 109*  BUN 16 13 14 10   CREATININE 0.83 0.73 0.75 0.83  CALCIUM  8.8* 8.8* 8.2* 8.7*    GFR: Estimated Creatinine Clearance: 62.7 mL/min (by C-G formula based on SCr of 0.83 mg/dL).  Liver Function Tests: Recent Labs  Lab 12/08/23 1331  ALBUMIN  4.3    CBG: Recent Labs  Lab 12/12/23 0133  GLUCAP 76     Recent Results (from the past 240 hours)  CSF culture w Gram Stain     Status: None   Collection Time: 12/08/23  1:31 PM   Specimen: CSF; Cerebrospinal Fluid  Result Value Ref Range Status   Specimen Description CSF  Final   Special Requests NONE  Final   Gram Stain   Final    WBC PRESENT, PREDOMINANTLY MONONUCLEAR NO ORGANISMS SEEN CYTOSPIN SMEAR NO RBC SEEN    Culture   Final    NO GROWTH 3 DAYS Performed at [facility], [address] [place], [place] [address]    Report Status 12/11/2023 FINAL  Final         Radiology Studies: MR ORBITS W WO CONTRAST Result Date: 12/12/2023 CLINICAL DATA:  Initial evaluation for endo most spectrum disorder. EXAM: MRI OF THE ORBITS WITHOUT AND WITH CONTRAST TECHNIQUE:  Multiplanar, multi-echo pulse sequences of the orbits and surrounding structures were acquired including fat saturation techniques, before and after intravenous contrast administration. CONTRAST:  6mL GADAVIST  GADOBUTROL  1 MMOL/ML IV SOLN COMPARISON:  MRI from 12/06/2023 FINDINGS: Orbits:  Examination degraded by motion artifact. Globes are symmetric in size with normal appearance and morphology bilaterally. There is question of focal atrophy involving the distal left optic nerve just posterior to the left globe (series 4, image 17). No significant abnormal enhancement is visible to suggest active or acute optic neuritis, although evaluation limited by motion. Right optic nerve normal in appearance. Intraconal and extraconal fat maintained. Extra-ocular muscles symmetric and within normal limits. No abnormality about the orbital apices or cavernous sinus. Optic chiasm normally situated within the suprasellar cistern. Lacrimal glands within normal limits. Visualized sinuses: Minor mucoperiosteal thickening present about the ethmoidal air cells. Paranasal sinuses are otherwise clear. Soft tissues: No visible periorbital soft tissue swelling. Limited intracranial: Unremarkable. IMPRESSION: 1. Question  focal atrophy involving the distal left optic nerve just posterior to the left globe. No significant abnormal enhancement to suggest active or acute optic neuritis at this time. 2. Otherwise unremarkable MRI of the orbits. Electronically Signed   By: Morene Hoard M.D.   On: 12/12/2023 20:44        Scheduled Meds:  acetaminophen   1,000 mg Oral Q8H   artificial tears  2 drop Both Eyes Q2H   carbamazepine   100 mg Oral TID   Chlorhexidine  Gluconate Cloth  6 each Topical Daily   DULoxetine   60 mg Oral Daily   gabapentin   900 mg Oral TID   metroNIDAZOLE   Topical BID   polyethylene glycol  17 g Oral BID   senna  1 tablet Oral QHS   traZODone   150 mg Oral QHS   Continuous Infusions:   LOS: 8 days     Time spent: over 30 min     Meliton Monte, MD [facility]   To contact the attending provider between 7A-7P or the covering provider during after hours 7P-7A, please log into the web site [email or web address] and access using universal [place] password for that web site. If you do not have the password, please call the hospital operator.  12/14/2023, 2:46 PM

## 2023-12-14 NOTE — Plan of Care (Signed)
  Problem: Pain Managment: Goal: General experience of comfort will improve and/or be controlled Outcome: Progressing

## 2023-12-15 LAB — PHOSPHORUS: Phosphorus: 4.4 mg/dL (ref 2.5–4.6)

## 2023-12-15 LAB — COMPREHENSIVE METABOLIC PANEL WITH GFR
ALT: 47 U/L — ABNORMAL HIGH (ref 0–44)
AST: 29 U/L (ref 15–41)
Albumin: 3.4 g/dL — ABNORMAL LOW (ref 3.5–5.0)
Alkaline Phosphatase: 36 U/L — ABNORMAL LOW (ref 38–126)
Anion gap: 8 (ref 5–15)
BUN: 16 mg/dL (ref 6–20)
CO2: 29 mmol/L (ref 22–32)
Calcium: 8.6 mg/dL — ABNORMAL LOW (ref 8.9–10.3)
Chloride: 103 mmol/L (ref 98–111)
Creatinine, Ser: 0.73 mg/dL (ref 0.44–1.00)
GFR, Estimated: >60 mL/min (ref >60)
Glucose, Bld: 127 mg/dL — ABNORMAL HIGH (ref 70–99)
Potassium: 4.4 mmol/L (ref 3.5–5.1)
Sodium: 140 mmol/L (ref 135–145)
Total Bilirubin: 0.6 mg/dL (ref 0.0–1.2)
Total Protein: 5.3 g/dL — ABNORMAL LOW (ref 6.5–8.1)

## 2023-12-15 LAB — CBC WITH DIFFERENTIAL/PLATELET
Abs Immature Granulocytes: 0.12 K/uL — ABNORMAL HIGH (ref 0.00–0.07)
Basophils Absolute: 0.1 K/uL (ref 0.0–0.1)
Basophils Relative: 1 %
Eosinophils Absolute: 0.3 K/uL (ref 0.0–0.5)
Eosinophils Relative: 3 %
HCT: 36.1 % (ref 36.0–46.0)
Hemoglobin: 11.9 g/dL — ABNORMAL LOW (ref 12.0–15.0)
Immature Granulocytes: 2 %
Lymphocytes Relative: 34 %
Lymphs Abs: 2.7 K/uL (ref 0.7–4.0)
MCH: 30.5 pg (ref 26.0–34.0)
MCHC: 33 g/dL (ref 30.0–36.0)
MCV: 92.6 fL (ref 80.0–100.0)
Monocytes Absolute: 1 K/uL (ref 0.1–1.0)
Monocytes Relative: 12 %
Neutro Abs: 3.8 K/uL (ref 1.7–7.7)
Neutrophils Relative %: 48 %
Platelets: 141 K/uL — ABNORMAL LOW (ref 150–400)
RBC: 3.9 MIL/uL (ref 3.87–5.11)
RDW: 12.2 % (ref 11.5–15.5)
WBC: 7.9 K/uL (ref 4.0–10.5)
nRBC: 0 % (ref 0.0–0.2)

## 2023-12-15 LAB — MAGNESIUM: Magnesium: 2.3 mg/dL (ref 1.7–2.4)

## 2023-12-15 LAB — OLIGOCLONAL BANDS, CSF + SERM

## 2023-12-15 MED ORDER — LACTATED RINGERS IV BOLUS
250.0000 mL | Freq: Once | INTRAVENOUS | Status: AC
  Administered 2023-12-15: 250 mL via INTRAVENOUS

## 2023-12-15 MED ORDER — DIPHENHYDRAMINE HCL 25 MG PO CAPS
25.0000 mg | ORAL_CAPSULE | Freq: Once | ORAL | Status: AC
  Administered 2023-12-15: 25 mg via ORAL
  Filled 2023-12-15: qty 1

## 2023-12-15 MED ORDER — PROCHLORPERAZINE EDISYLATE 10 MG/2ML IJ SOLN
10.0000 mg | Freq: Once | INTRAMUSCULAR | Status: AC
  Administered 2023-12-15: 10 mg via INTRAVENOUS
  Filled 2023-12-15: qty 2

## 2023-12-15 MED ORDER — ORAL CARE MOUTH RINSE: 15.0000 mL | OROMUCOSAL | Status: DC | PRN

## 2023-12-15 NOTE — Plan of Care (Signed)

## 2023-12-15 NOTE — Progress Notes (Signed)
 PROGRESS NOTE    Patricia Patton  FMW:[id number] DOB: 01-Mar-1971 DOA: 12/06/2023 PCP: Claudene Rayfield HERO, MD  No chief complaint on file.   Brief Narrative:   53 yo with hx R sided trigeminal neuralgia, anxiety, depression, and multiple other medical issues transferred from [facility], now being followed by neurology with concern for transverse myelitis.   Assessment & Plan:   Principal Problem:   Neuromyelitis optica spectrum disorder (HCC) Active Problems:   Anxiety and depression   Abnormal finding on GI tract imaging   Grade II internal hemorrhoids  Acute Transverse Myelitis  - MRI with abnormal increased T2 signal within the spinal cord extending from cervical medullary junction to the mid body of C6, with abnormal enhancement posterolaterally on the R at cervical medullary junction and posterolaterally on the L at C1 and C2 vertebral bodies c/w demyelinating disease - MRI T spine without demyelinating disease.  MRI L spine without spinal canal stenosis or neural foraminal narrowing in lumbar spine.  MRI orbits without evidence of acute or prior optic neuritis.  MRI brain with abnormal cervical findings noted concerning for demyelinating disease. - repeat MRI orbits with ? Focal atrophy of distal L optic nerve (no significant abnormal enhancement to suggest active or acute optic neuritis) - s/p LP with elevated WBC count, lymph predominant.  Protein 31, Glucose 83.  Negative meningitis/encephalitis panel.  Pending oligoclonal bands. - negative autoimmune myelopathy panel - negative neuromyelitis optica ab - negative aqp4 ab and mog ab - negative antiextractable nuclear ag, negative anca, negative ANA - appreciate neurology recs - follow CSF myelitis panel, mayo test code MAC1.  Gabapentin  900 mg TID.  Carbamazepine  decreased to home regimen.  PLEX per neurology (on hold for thrombocytopenia - hopefully tmrw if platelets >150).  ID c/s for possible PCR negatiev HSV infection (HSV1  and 2 IgG added to CSF - no recommendation for antivirals).   - ophtho saw and exam benign without evdience of optic neuritis or other pathology requiring urgent intervention  Trigeminal Neuralgia - appreciate neurology assistance - continuing tegretol , gabapentin   Acne - started on flagyl cream  Irregular Bowel Wall thickening concerning for malignancy - s/p colonoscopy which was normal  Depression  Anxiety - cymbalta , trazodone   Small Fiber Neuropathy - continue cymbalta , gabapentin     DVT prophylaxis: SCD Code Status: full Family Communication: none Disposition:   Status is: Inpatient Remains inpatient appropriate because: need for continued inpatient care   Consultants:  Neurology GI ID Ophtho   Procedures:  Colonoscopy - The entire examined colon is normal. - Ascending colon was normal- appearing on anterograde and retroflexed views. Made several passes through the right colon and no pathology noted. - Non- bleeding internal hemorrhoids. - The examined portion of the ileum was normal. - No specimens collected. Recommendation: - Return patient to hospital ward for ongoing care. - Advance diet as tolerated. - Continue present medications. - Repeat colonoscopy in 10 years for screening purposes. - Return to GI office PRN. - Inpatient GI service will sign off at this time. Please do not hesitate to contact us  with additional questions or concerns.  Antimicrobials:  Anti-infectives (From admission, onward)    None       Subjective: Pain about the same  Objective: Vitals:   12/15/23 0017 12/15/23 0356 12/15/23 0825 12/15/23 1243  BP: (!) 105/59 95/70 (!) 91/57 (!) 102/58  Pulse: 79 80 84 87  Resp: 18 18 18 18   Temp: 98.4 F (36.9 C) 98.2 F (36.8 C)  98.3 F (36.8 C) 98 F (36.7 C)  TempSrc: Oral Oral    SpO2: 94% 93% 93% 95%  Weight:      Height:        Intake/Output Summary (Last 24 hours) at 12/15/2023 1454 Last data filed at 12/14/2023 2200 Gross  per 24 hour  Intake 270 ml  Output --  Net 270 ml    Filed Weights   12/09/23 1101 12/11/23 1016 12/13/23 0817  Weight: 60.8 kg 62.6 kg 59 kg    Examination:  General: appears uncomfortable, limiting movement of her head due to discomfort Cardiovascular: RRR Lungs: unlabored Neurological: Alert and oriented 3. Moves all extremities 4 with equal strength. Cranial nerves II through XII grossly intact. Extremities: No clubbing or cyanosis. No edema.   Data Reviewed: I have personally reviewed following labs and imaging studies  CBC: Recent Labs  Lab 12/11/23 0443 12/12/23 0200 12/13/23 0545 12/14/23 0944 12/15/23 0154  WBC 9.3 11.1* 8.9 6.9 7.9  NEUTROABS 4.7 6.4 5.1 4.4 3.8  HGB 11.7* 12.4 12.3 11.9* 11.9*  HCT 34.6* 37.3 36.5 36.4 36.1  MCV 89.2 90.5 89.5 91.5 92.6  PLT 148* 121* 110* 123* 141*    Basic Metabolic Panel: Recent Labs  Lab 12/09/23 0435 12/10/23 0515 12/11/23 0443 12/12/23 0200 12/15/23 0154  NA 140 143 141 143 140  K 3.6 3.7 3.1* 3.9 4.4  CL 102 108 106 102 103  CO2 27 25 27 28 29   GLUCOSE 115* 153* 86 109* 127*  BUN 16 13 14 10 16   CREATININE 0.83 0.73 0.75 0.83 0.73  CALCIUM  8.8* 8.8* 8.2* 8.7* 8.6*  MG  --   --   --   --  2.3  PHOS  --   --   --   --  4.4    GFR: Estimated Creatinine Clearance: 65.1 mL/min (by C-G formula based on SCr of 0.73 mg/dL).  Liver Function Tests: Recent Labs  Lab 12/15/23 0154  AST 29  ALT 47*  ALKPHOS 36*  BILITOT 0.6  PROT 5.3*  ALBUMIN  3.4*    CBG: Recent Labs  Lab 12/12/23 0133  GLUCAP 76     Recent Results (from the past 240 hours)  CSF culture w Gram Stain     Status: None   Collection Time: 12/08/23  1:31 PM   Specimen: CSF; Cerebrospinal Fluid  Result Value Ref Range Status   Specimen Description CSF  Final   Special Requests NONE  Final   Gram Stain   Final    WBC PRESENT, PREDOMINANTLY MONONUCLEAR NO ORGANISMS SEEN CYTOSPIN SMEAR NO RBC SEEN    Culture   Final    NO  GROWTH 3 DAYS Performed at [facility], [address] [place], [place] [address]    Report Status 12/11/2023 FINAL  Final         Radiology Studies: No results found.       Scheduled Meds:  acetaminophen   1,000 mg Oral Q8H   artificial tears  2 drop Both Eyes QID   carbamazepine   100 mg Oral TID   Chlorhexidine  Gluconate Cloth  6 each Topical Daily   diphenhydrAMINE   25 mg Oral Once   DULoxetine   60 mg Oral Daily   gabapentin   900 mg Oral TID   metroNIDAZOLE   Topical BID   polyethylene glycol  17 g Oral BID   prochlorperazine   10 mg Intravenous Once   senna  1 tablet Oral QHS   traZODone   150 mg Oral QHS   Continuous Infusions:  lactated ringers       LOS: 9 days    Time spent: over 30 min     Meliton Monte, MD [facility]   To contact the attending provider between 7A-7P or the covering provider during after hours 7P-7A, please log into the web site [email or web address] and access using universal [place] password for that web site. If you do not have the password, please call the hospital operator.  12/15/2023, 2:54 PM

## 2023-12-15 NOTE — Plan of Care (Signed)
  Problem: Health Behavior/Discharge Planning: Goal: Ability to manage health-related needs will improve Outcome: Progressing   Problem: Clinical Measurements: Goal: Will remain free from infection Outcome: Progressing   Problem: Activity: Goal: Risk for activity intolerance will decrease Outcome: Progressing   Problem: Nutrition: Goal: Adequate nutrition will be maintained Outcome: Progressing   Problem: Elimination: Goal: Will not experience complications related to bowel motility Outcome: Progressing   Problem: Elimination: Goal: Will not experience complications related to urinary retention Outcome: Progressing   Problem: Safety: Goal: Ability to remain free from injury will improve Outcome: Progressing

## 2023-12-15 NOTE — Progress Notes (Signed)
 NEUROLOGY CONSULT FOLLOW UP NOTE   Date of service: December 15, 2023 Patient Name: Patricia Patton MRN:  [id number] DOB:  [birth year]    Review of HPI  53 y.o. female with hx of recently diagnosed right sided trigeminal neuralgia as well as anxiety and depression who presented to the ED with worsening right sided facial pain and bilateral shoulder pain.  She was in her usual state of health until 2 months ago.  She had no pre-existing neurologic conditions other than numbness in her fingertips and her toes which had been previously diagnosed by outside neurologist as small fiber neuropathy.  She began to have severe paroxysmal facial pain in her right face initially around the V1 region and then spreading to V2 and V3.  The paroxysms of pain were crippling and 10 out of 10 in severity.  They would be triggered reliably by touching her face or brushing her hair.  She initially sought care at [facility] and they suspected that she may be developing shingles and that her rash had just not shown yet.  She was treated with Valtrex and increasing doses of gabapentin .  She never did develop a rash.  The trigeminal neuralgia-like pain then spread to the left V1 region.  The right sided pain was still with a paroxysmal component but was becoming near constant and she was unable to function.  Prior to admission she was taking gabapentin  900 mg 3 times daily and carbamazepine  100 mg 3 times daily without significant benefit.  She denied any other neurologic symptoms although she reported pain and mild weakness in her neck and shoulders.  On initial neurological exam at [facility] on  9/23, she had bilateral deltoid weakness and was lethargic secondary to narcotics administered in the ED.  MRI brain was performed which showed no significant intracranial abnormalities but did show abnormal T2 signal throughout the visualized cervical cord and abnormal enhancement along the right lateral and left posterolateral surfaces of the  upper cervical spinal cord most concerning for a transverse myelitis.   Interval Hx/subjective  Complains of 10/10 calvarial headache pain, 7/10 right V1 and V2 distribution trigeminal pain and 4/10 left forehead trigeminal pain. Pain is worsened with movement, tactile stimulation and chewing food.   Vitals   Vitals:   12/14/23 2155 12/15/23 0017 12/15/23 0356 12/15/23 0825  BP: (!) 95/53 (!) 105/59 95/70 (!) 91/57  Pulse: 91 79 80 84  Resp: 18 18 18 18   Temp: 100.2 F (37.9 C) 98.4 F (36.9 C) 98.2 F (36.8 C) 98.3 F (36.8 C)  TempSrc: Oral Oral Oral   SpO2: 94% 94% 93% 93%  Weight:      Height:         Body mass index is 23.79 kg/m.  Physical Exam   Constitutional: Appears well-developed and well-nourished.  Psych: Dysthymic affect; also with pained affect at times. Improved compared with yesterday.  Eyes: No scleral injection.  HENT: No OP obstrucion.  Head: Normocephalic.  Respiratory: Effort normal, non-labored breathing.   Neurologic Examination   Mental Status: Awake, alert and oriented. Speech fluent with intact naming and comprehension.  Cranial Nerves: II: PERRL. Fixates and tracks normally.    III,IV, VI: No ptosis. EOMI. No nystagmus.  V: Patchy temp sensation deficits to face bilaterally VII: Subtle right facial weakness, lower quadrant (patient states this is chronic due to prior Bell's palsy about 10 years ago) VIII: Hearing intact to conversation IX,X: No hoarseness or hypophonia XI: Symmetric XII: Midline tongue extension Motor:  Right :  Upper extremity   5/5                                      Left:     Upper extremity   5/5             Lower extremity   5/5                                                  Lower extremity   5/5 No pronator drift. Muscle tone and bulk is normal x 4.  Sensory: Temp sensation decreased to right face, arm and leg.  Deep Tendon Reflexes: 2+ and symmetric bilateral biceps and brachioradialis. 4+ right patellar, 2+  left patellar, 2+ right achilles, 0 left achilles. 3 beats clonus with dorsiflexion of right ankle, 0 beats clonus with left ankle.    Cerebellar: No ataxia with FNF bilaterally. No tremor noted.  Gait: Deferred  Medications  Current Facility-Administered Medications:    acetaminophen  (TYLENOL ) tablet 1,000 mg, 1,000 mg, Oral, Q8H, 1,000 mg at 12/15/23 0537 **FOLLOWED BY** [START ON 12/19/2023] acetaminophen  (TYLENOL ) tablet 650 mg, 650 mg, Oral, Q6H PRN, Perri DELENA Meliton Mickey., MD   artificial tears ophthalmic solution 2 drop, 2 drop, Both Eyes, QID, Perri DELENA Meliton Mickey., MD, 2 drop at 12/15/23 9061   carbamazepine  (TEGRETOL ) chewable tablet 100 mg, 100 mg, Oral, TID, Samtani, Jai-Gurmukh, MD, 100 mg at 12/15/23 9061   Chlorhexidine  Gluconate Cloth 2 % PADS 6 each, 6 each, Topical, Daily, Samtani, Jai-Gurmukh, MD, 6 each at 12/13/23 2209   diphenhydrAMINE  (BENADRYL ) capsule 25 mg, 25 mg, Oral, Q6H PRN, Perri DELENA Meliton Mickey., MD, 25 mg at 12/14/23 1509   DULoxetine  (CYMBALTA ) DR capsule 60 mg, 60 mg, Oral, Daily, Samtani, Jai-Gurmukh, MD, 60 mg at 12/15/23 9061   gabapentin  (NEURONTIN ) capsule 900 mg, 900 mg, Oral, TID, Samtani, Jai-Gurmukh, MD, 900 mg at 12/15/23 9061   metroNIDAZOLE (METROGEL) 0.75 % gel, , Topical, BID, Samtani, Jai-Gurmukh, MD, Given at 12/15/23 0939   morphine (PF) 2 MG/ML injection 1 mg, 1 mg, Intravenous, Q2H PRN, Samtani, Jai-Gurmukh, MD, 1 mg at 12/15/23 0541   oxyCODONE  (Oxy IR/ROXICODONE ) immediate release tablet 5 mg, 5 mg, Oral, Q4H PRN **OR** oxyCODONE  (Oxy IR/ROXICODONE ) immediate release tablet 10 mg, 10 mg, Oral, Q4H PRN, Perri DELENA Meliton Mickey., MD, 10 mg at 12/15/23 0944   polyethylene glycol (MIRALAX / GLYCOLAX) packet 17 g, 17 g, Oral, BID, Samtani, Jai-Gurmukh, MD, 17 g at 12/14/23 2132   senna (SENOKOT) tablet 8.6 mg, 1 tablet, Oral, QHS, Samtani, Jai-Gurmukh, MD, 8.6 mg at 12/14/23 2134   traZODone  (DESYREL ) tablet 150 mg, 150 mg, Oral, QHS, Samtani,  Jai-Gurmukh, MD, 150 mg at 12/14/23 2134  Labs and Diagnostic Imaging   CBC:  Recent Labs  Lab 12/14/23 0944 12/15/23 0154  WBC 6.9 7.9  NEUTROABS 4.4 3.8  HGB 11.9* 11.9*  HCT 36.4 36.1  MCV 91.5 92.6  PLT 123* 141*    Basic Metabolic Panel:  Lab Results  Component Value Date   NA 140 12/15/2023   K 4.4 12/15/2023   CO2 29 12/15/2023   GLUCOSE 127 (H) 12/15/2023   BUN 16 12/15/2023   CREATININE 0.73 12/15/2023   CALCIUM  8.6 (L) 12/15/2023  GFRNONAA >60 12/15/2023   GFRAA >60 09/30/2018   Lipid Panel: No results found for: LDLCALC HgbA1c: No results found for: HGBA1C Urine Drug Screen: No results found for: LABOPIA, COCAINSCRNUR, LABBENZ, AMPHETMU, THCU, LABBARB  Alcohol Level No results found for: [facility] INR  Lab Results  Component Value Date   INR 1.3 (H) 12/10/2023   APTT  Lab Results  Component Value Date   APTT 26 12/07/2023   Serum Autoimmune Myelopathy panel has resulted and is pan-negative: Anti-Hu Ab                     Negative                  BN     Reference Range: Negative                              This test was developed and its performance characteristics  determined by [facility]. It has not been cleared or approved  by the Food and Drug Administration.  Anti-Ri Ab                     Negative                  BN     Reference Range: Negative                              This test was developed and its performance characteristics  determined by [facility]. It has not been cleared or approved  by the Food and Drug Administration.  Antineuronal nuclear Ab Type 3 Negative                  BN     Reference Range: Negative                              This test was developed and its performance characteristics  determined by [facility]. It has not been cleared or approved  by the Food and Drug Administration.  PCA Type-1 (Anti-Yo) Ab        Negative                  BN     Reference Range: Negative                              This  test was developed and its performance characteristics  determined by [facility]. It has not been cleared or approved  by the Food and Drug Administration.  Purkinje Cell Cyto Ab Type 2   Negative                  BN     Reference Range: Negative                              This test was developed and its performance characteristics  determined by [facility]. It has not been cleared or approved  by the Food and Drug Administration.  Purkinje Cell Cyto Ab Type Tr  Negative                  BN     Reference Range: Negative  This test was developed and its performance characteristics  determined by [facility]. It has not been cleared or approved  by the Food and Drug Administration.  Amphiphysin Antibody           Negative                  BN     Reference Range: Negative                              This test was developed and its performance characteristics  determined by [facility]. It has not been cleared or approved  by the Food and Drug Administration.  CRMP-5 IgG                     Negative                  BN     Reference Range: Negative                              This test was developed and its performance characteristics  determined by [facility]. It has not been cleared or approved  by the Food and Drug Administration.  CRMP-5 IgG Line Blot           Negative                  BN     Reference Range: Negative                              This test was developed and its performance characteristics  determined by [facility]. It has not been cleared or approved  by the Food and Drug Administration.  AGNA-1                         Negative                  BN     Reference Range: Negative                              This test was developed and its performance characteristics  determined by [facility]. It has not been cleared or approved  by the Food and Drug Administration.  DPPX Antibody                  Negative                  BN     Reference Range: Negative                               This test was developed and its performance characteristics  determined by [facility]. It has not been cleared or approved  by the Food and Drug Administration.  mGluR1 Antibody                Negative                  BN     Reference Range: Negative  This test was developed and its performance characteristics  determined by [facility]. It has not been cleared or approved  by the Food and Drug Administration.  AMPA-R1 Antibody               Negative                  BN     Reference Range: Negative                              This test was developed and its performance characteristics  determined by [facility]. It has not been cleared or approved  by the Food and Drug Administration.  AMPA-R2 Antibody               Negative                  BN     Reference Range: Negative                              This test was developed and its performance characteristics  determined by [facility]. It has not been cleared or approved  by the Food and Drug Administration.  GABA-B-R Antibody              Negative                  BN     Reference Range: Negative                              This test was developed and its performance characteristics  determined by [facility]. It has not been cleared or approved  by the Food and Drug Administration.  NMDA-R Antibody                Negative                  BN     Reference Range: Negative                              This test was developed and its performance characteristics  determined by [facility]. It has not been cleared or approved  by the Food and Drug Administration.  GAD65 Antibody                 Negative                  BN     Reference Range: Negative                              This test was developed and its performance characteristics  determined by [facility]. It has not been cleared or approved  by the Food and Drug Administration.  Aquaporin 4 Antibody           Negative                  BN      Reference Range: Negative                        CSF meningitis/encephalitis panel: Negative for all pathogens tested, including HSV1-2, VZV and HHV6    Assessment  Patricia Patton is a 53 y.o. 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 Her symptoms began with severe trigeminal neuralgia pain, which has been unremitting and resistant to medications.  - Exam today is as documented above. Reflexes less brisk than yesterday. No limb weakness.  - Symptoms today: Complains of 10/10 calvarial headache pain, 7/10 right V1 and V2 distribution trigeminal pain and 4/10 left forehead trigeminal pain. Pain is worsened with movement, tactile stimulation and chewing food.  - Neuroimaging: - MRI orbits w/wo contrast: No evidence of acute or prior optic neuritis  - MRI brain w/wo contrast: No acute infarct. No acute intracranial hemorrhage. No mass effect or midline shift. No hydrocephalus. The sella is unremarkable. Normal flow voids. There is mild subcortical white matter disease present bilaterally - MRI C-spine w/wo contrast:  Abnormal T2 signal throughout the visualized cervical cord and abnormal enhancement along the right lateral and left posterolateral surfaces of the upper cervical spinal cord. The findings are concerning for demyelinating disease, but neoplasm cannot be excluded.  - MRI L-spine w/wo contrast: No spinal canal stenosis or neural foraminal narrowing in the lumbar spine.  - MRI T-spine w/wo contrast: No evidence of demyelinating disease involving the thoracic spinal cord.  - Repeat MRI orbits w/wo contrast (9/30): Question focal atrophy involving the  distal left optic nerve just posterior to the left globe. No significant abnormal enhancement to suggest active or acute optic neuritis at this time. Otherwise unremarkable MRI of the orbits. - EKG: Sinus tachycardia; Otherwise normal ECG - Labs: - CSF meningitis/encephalitis panel: Negative for all pathogens tested, including HSV1-2, VZV and HHV6 - CSF with elevated WBC of 17, predominantly lymphocytes. Elevated IgG index of 1.2. Total protein normal at 31.  - Serum Autoimmune Myelopathy panel has resulted and is pan-negative: - Differential diagnosis: - Although she has negative anti-MOG antibodies, her MRI findings are typical for this entity and MOGAD (myelin-oligodendrocyte glycoprotein antibody-associated disease) can present with acute trigeminal neuralgia as well, based on multiple case reports documented in the literature. Although anti-MOG Ab titers do have high sensitivity of about 90%, MOGAD is still relatively high on the differential despite her negative titer due to the clinical and imaging characteristics.  - Anti-aqp4 antibodies were also negative, which makes neuromyelitis optica much less likely given that she has no brain lesions and no symptoms or signs of optic neuritis.   - At this point, even if this is not MOGAD, out suspicion that this is some type of autoimmune process is very high and for this reason she has been started on plasmapheresis. - Her LP shows mild inflammation as would be expected with an inflammatory myelitis.  - ID saw the patient on 10/1 and feel that it is unlikely that the etiology of all of her neuro findings are secondary to HSV. They have called [facility] and have added on HSV1 and 2 IgG to her CSF fluid that had already been sent out. They do not recommend starting anti virals at this time.  - CT chest abdomen pelvis looking for possible paraneoplastic association, in conjunction with colonoscopy, are negative for a mass lesion (possible apple-core lesion on CT  was refuted by colonoscopy).   - Platelets initially were  normal, then declined with patient thrombocytopenic. Last normal platelet count was 203 on 9/28, subsequently 148 (9/29) >> 121 (9/30) >> 110 >> 123 >> 141 (10/3). Now trending upwards after lowering of her CBZ dose, but may also be due to holding her PLEX treatments. DDx includes secondary to carbamazepine  versus a rare complication of PLEX.  - Complained of recurrence of blurred vision on Tuesday. DDx includes due to NMO versus MOGAD versus secondary to headache pain. Her blurred vision was worse with either eye closed, better with binocular vision. Dr. Corbin of Ophthalmology ordered MRI orbits although received steroids so not really sure what else to do.  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 HSV can also be associated with transverse myelitis (herpes zoster myelitis), but CSF HSV PCR was negative.  - Severe trigeminal neuralgia of recent onset. Possible etiologies include autoimmune, versus secondary to HSV (although CSF HSV PCR was negative).   Recommendations  - Follow-up CSF myelitis panel, Mayo test code MAC1 - Follow-up ANCA titers, ENA panel(includes SSA, SSBA among others)  - Continue gabapentin  900 mg 3 times daily - Decreased carbamazepine  back to her home dosage regimen on Tuesday, as it has been ineffective at controlling her pain and may be causing her thrombocytopenia.  - Escalate opiate pain medication for her intractable trigeminal neuralgia pain - PLEX Tx #4 was planned for today. However, given that her platelets have not recovered to > 150, will continue to postpone. Anticipate that PLEX may be resumed tomorrow, pending repeat platelet level on Saturday AM.  - Continue to monitor her vision for any changes - After PLEX #5, will need to obtain repeat MRIs of the brain and C-spine w/wo contrast - Neurology will continue to  follow ______________________________________________________________________   Bonney SHARK, Paraskevi Funez, MD [facility]

## 2023-12-16 LAB — CBC WITH DIFFERENTIAL/PLATELET
Abs Immature Granulocytes: 0.06 K/uL (ref 0.00–0.07)
Basophils Absolute: 0 K/uL (ref 0.0–0.1)
Basophils Relative: 1 %
Eosinophils Absolute: 0.2 K/uL (ref 0.0–0.5)
Eosinophils Relative: 3 %
HCT: 32.5 % — ABNORMAL LOW (ref 36.0–46.0)
Hemoglobin: 10.5 g/dL — ABNORMAL LOW (ref 12.0–15.0)
Immature Granulocytes: 1 %
Lymphocytes Relative: 23 %
Lymphs Abs: 1.5 K/uL (ref 0.7–4.0)
MCH: 30.1 pg (ref 26.0–34.0)
MCHC: 32.3 g/dL (ref 30.0–36.0)
MCV: 93.1 fL (ref 80.0–100.0)
Monocytes Absolute: 0.8 K/uL (ref 0.1–1.0)
Monocytes Relative: 12 %
Neutro Abs: 4.1 K/uL (ref 1.7–7.7)
Neutrophils Relative %: 60 %
Platelets: 162 K/uL (ref 150–400)
RBC: 3.49 MIL/uL — ABNORMAL LOW (ref 3.87–5.11)
RDW: 11.8 % (ref 11.5–15.5)
WBC: 6.6 K/uL (ref 4.0–10.5)
nRBC: 0 % (ref 0.0–0.2)

## 2023-12-16 MED ORDER — SODIUM CHLORIDE 0.9% FLUSH
10.0000 mL | Freq: Two times a day (BID) | INTRAVENOUS | Status: DC
  Administered 2023-12-16 – 2023-12-21 (×9): 10 mL

## 2023-12-16 MED ORDER — CALCIUM CARBONATE ANTACID 500 MG PO CHEW: 2.0000 | CHEWABLE_TABLET | ORAL | Status: DC

## 2023-12-16 MED ORDER — SODIUM CHLORIDE 0.9 % IV SOLN: Freq: Once | INTRAVENOUS | Status: DC

## 2023-12-16 MED ORDER — ACD FORMULA A 0.73-2.45-2.2 GM/100ML VI SOLN
1000.0000 mL | Status: DC
  Administered 2023-12-16 (×2): 1000 mL
  Filled 2023-12-16: qty 1000

## 2023-12-16 MED ORDER — SODIUM CHLORIDE 0.9% FLUSH
10.0000 mL | INTRAVENOUS | Status: DC | PRN
  Administered 2023-12-18: 10 mL

## 2023-12-16 MED ORDER — CALCIUM GLUCONATE-NACL 2-0.675 GM/100ML-% IV SOLN
2.0000 g | Freq: Once | INTRAVENOUS | Status: AC
  Administered 2023-12-16: 2000 mg via INTRAVENOUS
  Filled 2023-12-16: qty 100

## 2023-12-16 MED ORDER — CALCIUM CARBONATE ANTACID 500 MG PO CHEW
2.0000 | CHEWABLE_TABLET | ORAL | Status: AC
  Administered 2023-12-16 (×2): 400 mg via ORAL
  Filled 2023-12-16: qty 2

## 2023-12-16 MED ORDER — DIPHENHYDRAMINE HCL 25 MG PO CAPS
ORAL_CAPSULE | ORAL | Status: AC
  Filled 2023-12-16: qty 1

## 2023-12-16 MED ORDER — CALCIUM CARBONATE ANTACID 500 MG PO CHEW
CHEWABLE_TABLET | ORAL | Status: AC
  Filled 2023-12-16: qty 2

## 2023-12-16 MED ORDER — ANTICOAGULANT SODIUM CITRATE 4% (200MG/5ML) IV SOLN
5.0000 mL | Freq: Once | Status: AC
  Administered 2023-12-16: 5 mL
  Filled 2023-12-16: qty 5

## 2023-12-16 MED ORDER — DIPHENHYDRAMINE HCL 25 MG PO CAPS
25.0000 mg | ORAL_CAPSULE | Freq: Four times a day (QID) | ORAL | Status: DC | PRN
  Administered 2023-12-16 – 2023-12-18 (×4): 25 mg via ORAL
  Filled 2023-12-16 (×2): qty 1

## 2023-12-16 MED ORDER — SODIUM CHLORIDE 0.9 % IV SOLN
INTRAVENOUS | Status: AC
  Filled 2023-12-16 (×3): qty 200

## 2023-12-16 NOTE — Progress Notes (Signed)
 NEUROLOGY CONSULT FOLLOW UP NOTE   Date of service: December 16, 2023 Patient Name: Patricia Patton MRN:  [id number] DOB:  [birth year]    Review of HPI  53 y.o. female with hx of recently diagnosed right sided trigeminal neuralgia as well as anxiety and depression who presented to the ED with worsening right sided facial pain and bilateral shoulder pain.  She was in her usual state of health until 2 months ago.  She had no pre-existing neurologic conditions other than numbness in her fingertips and her toes which had been previously diagnosed by outside neurologist as small fiber neuropathy.  She began to have severe paroxysmal facial pain in her right face initially around the V1 region and then spreading to V2 and V3.  The paroxysms of pain were crippling and 10 out of 10 in severity.  They would be triggered reliably by touching her face or brushing her hair.  She initially sought care at [facility] and they suspected that she may be developing shingles and that her rash had just not shown yet.  She was treated with Valtrex and increasing doses of gabapentin .  She never did develop a rash.  The trigeminal neuralgia-like pain then spread to the left V1 region.  The right sided pain was still with a paroxysmal component but was becoming near constant and she was unable to function.  Prior to admission she was taking gabapentin  900 mg 3 times daily and carbamazepine  100 mg 3 times daily without significant benefit.  She denied any other neurologic symptoms although she reported pain and mild weakness in her neck and shoulders.  On initial neurological exam at [facility] on  9/23, she had bilateral deltoid weakness and was lethargic secondary to narcotics administered in the ED.  MRI brain was performed which showed no significant intracranial abnormalities but did show abnormal T2 signal throughout the visualized cervical cord and abnormal enhancement along the right lateral and left posterolateral surfaces of the  upper cervical spinal cord most concerning for a transverse myelitis.   Interval Hx/subjective  Patient remains hemodynamically stable with blood pressures on the low side.  She states that the pain in the top of her head and bilateral sides of her face is about a 7-8/10 and is worsened by touching the face, brushing hair or blinking.  Vitals   Vitals:   12/16/23 0113 12/16/23 0418 12/16/23 0822 12/16/23 0825  BP: (!) 93/54 (!) 92/53 (!) 86/62 (!) 89/54  Pulse:   84 85  Resp: 18 18 18    Temp: 98 F (36.7 C) 98 F (36.7 C) 98.6 F (37 C)   TempSrc: Oral Oral Oral   SpO2: 94% 93% 95%   Weight:      Height:         Body mass index is 23.79 kg/m.  Physical Exam   Constitutional: Appears well-developed and well-nourished.  Psych: Affect somewhat blunted but appropriate to situation Eyes: No scleral injection.  HENT: No OP obstrucion.  Head: Normocephalic.  Respiratory: Effort normal, non-labored breathing.   Neurologic Examination   Mental Status: Awake, alert and oriented. Speech clear and fluent  Cranial Nerves: II: PERRL. Fixates and tracks normally.    III,IV, VI: No ptosis. EOMI. No nystagmus.  V: Assessment of facial sensation deferred due to pain VII: Subtle right facial weakness, lower quadrant (patient states this is chronic due to prior Bell's palsy about 10 years ago) VIII: Hearing intact to conversation IX,X: No hoarseness or hypophonia XI: Symmetric XII: Midline  tongue extension Motor: Right :  Upper extremity   5/5                                      Left:     Upper extremity   5/5             Lower extremity   5/5                                                  Lower extremity   5/5 No pronator drift. Muscle tone and bulk is normal x 4.  Sensory: Sensation intact to light touch and symmetrical in bilateral extremities Cerebellar: No ataxia with FNF bilaterally. No tremor noted.  Gait: Deferred  Medications  Current Facility-Administered  Medications:    acetaminophen  (TYLENOL ) tablet 1,000 mg, 1,000 mg, Oral, Q8H, 1,000 mg at 12/16/23 0536 **FOLLOWED BY** [START ON 12/19/2023] acetaminophen  (TYLENOL ) tablet 650 mg, 650 mg, Oral, Q6H PRN, Perri DELENA Meliton Mickey., MD   artificial tears ophthalmic solution 2 drop, 2 drop, Both Eyes, QID, Perri DELENA Meliton Mickey., MD, 2 drop at 12/16/23 0827   carbamazepine  (TEGRETOL ) chewable tablet 100 mg, 100 mg, Oral, TID, Samtani, Jai-Gurmukh, MD, 100 mg at 12/16/23 0827   Chlorhexidine  Gluconate Cloth 2 % PADS 6 each, 6 each, Topical, Daily, Samtani, Jai-Gurmukh, MD, 6 each at 12/15/23 2200   diphenhydrAMINE  (BENADRYL ) capsule 25 mg, 25 mg, Oral, Q6H PRN, Perri DELENA Meliton Mickey., MD, 25 mg at 12/16/23 0010   DULoxetine  (CYMBALTA ) DR capsule 60 mg, 60 mg, Oral, Daily, Samtani, Jai-Gurmukh, MD, 60 mg at 12/16/23 9172   gabapentin  (NEURONTIN ) capsule 900 mg, 900 mg, Oral, TID, Samtani, Jai-Gurmukh, MD, 900 mg at 12/16/23 0827   metroNIDAZOLE (METROGEL) 0.75 % gel, , Topical, BID, Samtani, Jai-Gurmukh, MD, Given at 12/16/23 9171   morphine (PF) 2 MG/ML injection 1 mg, 1 mg, Intravenous, Q2H PRN, Samtani, Jai-Gurmukh, MD, 1 mg at 12/16/23 0429   Oral care mouth rinse, 15 mL, Mouth Rinse, PRN, Perri DELENA Meliton Mickey., MD   oxyCODONE  (Oxy IR/ROXICODONE ) immediate release tablet 5 mg, 5 mg, Oral, Q4H PRN **OR** oxyCODONE  (Oxy IR/ROXICODONE ) immediate release tablet 10 mg, 10 mg, Oral, Q4H PRN, Perri DELENA Meliton Mickey., MD, 10 mg at 12/16/23 0009   polyethylene glycol (MIRALAX / GLYCOLAX) packet 17 g, 17 g, Oral, BID, Samtani, Jai-Gurmukh, MD, 17 g at 12/16/23 9171   senna (SENOKOT) tablet 8.6 mg, 1 tablet, Oral, QHS, Samtani, Jai-Gurmukh, MD, 8.6 mg at 12/15/23 2106   sodium chloride  flush (NS) 0.9 % injection 10-40 mL, 10-40 mL, Intracatheter, Q12H, Powell, A Meliton Mickey., MD   sodium chloride  flush (NS) 0.9 % injection 10-40 mL, 10-40 mL, Intracatheter, PRN, Perri DELENA Meliton Mickey., MD   traZODone  (DESYREL )  tablet 150 mg, 150 mg, Oral, QHS, Samtani, Jai-Gurmukh, MD, 150 mg at 12/15/23 2106  Labs and Diagnostic Imaging   CBC:  Recent Labs  Lab 12/14/23 0944 12/15/23 0154  WBC 6.9 7.9  NEUTROABS 4.4 3.8  HGB 11.9* 11.9*  HCT 36.4 36.1  MCV 91.5 92.6  PLT 123* 141*    Basic Metabolic Panel:  Lab Results  Component Value Date   NA 140 12/15/2023   K 4.4 12/15/2023   CO2 29 12/15/2023  GLUCOSE 127 (H) 12/15/2023   BUN 16 12/15/2023   CREATININE 0.73 12/15/2023   CALCIUM  8.6 (L) 12/15/2023   GFRNONAA >60 12/15/2023   GFRAA >60 09/30/2018    INR  Lab Results  Component Value Date   INR 1.3 (H) 12/10/2023   APTT  Lab Results  Component Value Date   APTT 26 12/07/2023   Serum Autoimmune Myelopathy panel has resulted and is pan-negative: Anti-Hu Ab                     Negative                  BN     Reference Range: Negative                              This test was developed and its performance characteristics  determined by [facility]. It has not been cleared or approved  by the Food and Drug Administration.  Anti-Ri Ab                     Negative                  BN     Reference Range: Negative                              This test was developed and its performance characteristics  determined by [facility]. It has not been cleared or approved  by the Food and Drug Administration.  Antineuronal nuclear Ab Type 3 Negative                  BN     Reference Range: Negative                              This test was developed and its performance characteristics  determined by [facility]. It has not been cleared or approved  by the Food and Drug Administration.  PCA Type-1 (Anti-Yo) Ab        Negative                  BN     Reference Range: Negative                              This test was developed and its performance characteristics  determined by [facility]. It has not been cleared or approved  by the Food and Drug Administration.  Purkinje Cell Cyto Ab Type 2   Negative                   BN     Reference Range: Negative                              This test was developed and its performance characteristics  determined by [facility]. It has not been cleared or approved  by the Food and Drug Administration.  Purkinje Cell Cyto Ab Type Tr  Negative                  BN     Reference Range: Negative  This test was developed and its performance characteristics  determined by [facility]. It has not been cleared or approved  by the Food and Drug Administration.  Amphiphysin Antibody           Negative                  BN     Reference Range: Negative                              This test was developed and its performance characteristics  determined by [facility]. It has not been cleared or approved  by the Food and Drug Administration.  CRMP-5 IgG                     Negative                  BN     Reference Range: Negative                              This test was developed and its performance characteristics  determined by [facility]. It has not been cleared or approved  by the Food and Drug Administration.  CRMP-5 IgG Line Blot           Negative                  BN     Reference Range: Negative                              This test was developed and its performance characteristics  determined by [facility]. It has not been cleared or approved  by the Food and Drug Administration.  AGNA-1                         Negative                  BN     Reference Range: Negative                              This test was developed and its performance characteristics  determined by [facility]. It has not been cleared or approved  by the Food and Drug Administration.  DPPX Antibody                  Negative                  BN     Reference Range: Negative                              This test was developed and its performance characteristics  determined by [facility]. It has not been cleared or approved  by the Food and Drug Administration.  mGluR1  Antibody                Negative                  BN     Reference Range: Negative  This test was developed and its performance characteristics  determined by [facility]. It has not been cleared or approved  by the Food and Drug Administration.  AMPA-R1 Antibody               Negative                  BN     Reference Range: Negative                              This test was developed and its performance characteristics  determined by [facility]. It has not been cleared or approved  by the Food and Drug Administration.  AMPA-R2 Antibody               Negative                  BN     Reference Range: Negative                              This test was developed and its performance characteristics  determined by [facility]. It has not been cleared or approved  by the Food and Drug Administration.  GABA-B-R Antibody              Negative                  BN     Reference Range: Negative                              This test was developed and its performance characteristics  determined by [facility]. It has not been cleared or approved  by the Food and Drug Administration.  NMDA-R Antibody                Negative                  BN     Reference Range: Negative                              This test was developed and its performance characteristics  determined by [facility]. It has not been cleared or approved  by the Food and Drug Administration.  GAD65 Antibody                 Negative                  BN     Reference Range: Negative                              This test was developed and its performance characteristics  determined by [facility]. It has not been cleared or approved  by the Food and Drug Administration.  Aquaporin 4 Antibody           Negative                  BN     Reference Range: Negative                        CSF meningitis/encephalitis panel: Negative for all pathogens tested, including HSV1-2, VZV and HHV6    Assessment  Patricia Patton is a 53 y.o. 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 Her symptoms began with severe trigeminal neuralgia pain, which has been unremitting and resistant to medications.  - Exam today is as documented above. No limb weakness.  - Symptoms today: Headache has improved and is 7-8/10 in the top of her head and bilateral sides of the face.  We are hopeful that headache will continue to improve with more PLEX treatments. - Neuroimaging: - MRI orbits w/wo contrast: No evidence of acute or prior optic neuritis  - MRI brain w/wo contrast: No acute infarct. No acute intracranial hemorrhage. No mass effect or midline shift. No hydrocephalus. The sella is unremarkable. Normal flow voids. There is mild subcortical white matter disease present bilaterally - MRI C-spine w/wo contrast:  Abnormal T2 signal throughout the visualized cervical cord and abnormal enhancement along the right lateral and left posterolateral surfaces of the upper cervical spinal cord. The findings are concerning for demyelinating disease, but neoplasm cannot be excluded.  - MRI L-spine w/wo contrast: No spinal canal stenosis or neural foraminal narrowing in the lumbar spine.  - MRI T-spine w/wo contrast: No evidence of demyelinating disease involving the thoracic spinal cord.  - Repeat MRI orbits w/wo contrast (9/30): Question focal atrophy involving the distal left optic nerve just posterior to the left globe. No significant abnormal enhancement to suggest active or acute optic neuritis at this time. Otherwise unremarkable MRI of the orbits. - EKG: Sinus tachycardia; Otherwise normal ECG - Labs: - CSF  meningitis/encephalitis panel: Negative for all pathogens tested, including HSV1-2, VZV and HHV6 - CSF with elevated WBC of 17, predominantly lymphocytes. Elevated IgG index of 1.2. Total protein normal at 31.  - Serum Autoimmune Myelopathy panel has resulted and is pan-negative: - C-ANCA normal. P-ANCA normal. Atypical P-ANCA titer normal.  - ENA panel: -ENA SM Ab Ser-aCnc: Normal - Ribonucleic protein: Normal - Differential diagnosis: - Although she has negative anti-MOG antibodies, her MRI findings are typical for this entity and MOGAD (myelin-oligodendrocyte glycoprotein antibody-associated disease) can present with acute trigeminal neuralgia as well, based on multiple case reports documented in the literature. Although anti-MOG Ab titers do have high sensitivity of about 90%, MOGAD is still relatively high on the differential despite her negative titer due to the clinical and imaging characteristics.  - Anti-aqp4 antibodies were also negative, which makes neuromyelitis optica much less likely given that she has no brain lesions and no symptoms or signs of optic neuritis.   - At this point, even if this is not MOGAD, out suspicion that this is some type of autoimmune process is very high and for this reason she has been started on plasmapheresis. - Her LP shows mild inflammation as would be expected with an inflammatory myelitis.  - ID saw the patient on 10/1 and feel that it is unlikely that the etiology of all of her neuro findings are secondary to HSV. They have called [facility] and have added on HSV1 and 2 IgG to her CSF fluid that had already been sent out. They do not recommend starting anti virals at this time.  - CT chest abdomen pelvis looking for possible paraneoplastic association, in conjunction with colonoscopy, are negative for a  mass lesion (possible apple-core lesion on CT was refuted by colonoscopy).   - Platelets initially were normal, then declined with patient thrombocytopenic. Last  normal platelet count was 203 on 9/28, subsequently 148 (9/29) >> 121 (9/30) >> 110 >> 123 >> 141 (10/3). Now trending upwards after lowering of her CBZ dose, but may also be due to holding her PLEX treatments. DDx includes secondary to carbamazepine  versus a rare complication of PLEX.  - Complained of recurrence of blurred vision on Tuesday. DDx includes due to NMO versus MOGAD versus secondary to headache pain. Her blurred vision was worse with either eye closed, better with binocular vision. Dr. Corbin of Ophthalmology ordered MRI orbits although received steroids so not really sure what else to do.  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 HSV can also be associated with transverse myelitis (herpes zoster myelitis), but CSF HSV PCR was negative.  - Severe trigeminal neuralgia of recent onset. Possible etiologies include autoimmune, versus secondary to HSV (although CSF HSV PCR was negative).   Recommendations  - Follow-up CSF myelitis panel, Mayo test code MAC1 - SSA, SSBA titers pending  - Continue gabapentin  900 mg 3 times daily - Decreased carbamazepine  back to her home dosage regimen on Tuesday, as it has been ineffective at controlling her pain and may be causing her thrombocytopenia.  - Escalate opiate pain medication as needed for her intractable trigeminal neuralgia pain - Platelets today normalized to 162. Proceed with PLEX treatment #4 today.  - Continue to monitor her vision for any changes - After PLEX #5, will need to obtain repeat MRIs of the brain and C-spine w/wo contrast - Neurology will continue to follow ______________________________________________________________________  Patient seen by NP. Cortney E Everitt Clint Kill , MSN, AGACNP-BC [facility] See Amion for schedule and pager information 12/16/2023 9:37 AM   Electronically signed: Dr. Nylan Nevel

## 2023-12-16 NOTE — Progress Notes (Signed)
 TPE Tx 4  12/16/23 1910  Vitals  Temp 97.8 F (36.6 C)  Temp Source Oral  Pulse Rate 97  ECG Heart Rate 78  Resp 20  BP 111/62  Oxygen Therapy  SpO2 96 %  O2 Device Room Air  Patient Activity (if Appropriate) In bed  Pulse Oximetry Type Continuous  Pain Assessment  Pain Scale 0-10  Pain Score 0  Apheresis   Procedure Comments Pt voice no complaint. Pt. VSS and no distress noted.  Post-apheresis  Net Removed (mL) 2872 mL  Replacement (mL) 2262 mL  ACDA infused (mL) 462 mL  Total Normal Saline Administered 171 mL  Total Calcium  Administered 100 mL  Tolerated Procedure Yes  Post-Procedure Comments Tx. Complete without difficulties. Report call to 3W bedside Nurse.  Hemodialysis Catheter Right Internal jugular Triple lumen Temporary (Non-Tunneled)  Placement Date/Time: 12/07/23 1124   Serial / Lot #: [id number]  Expiration Date: 10/12/26  Time Out: Correct patient;Correct site;Correct procedure  Maximum sterile barrier precautions: Hand hygiene;Cap;Mask;Sterile gown;Sterile gloves;Large sterile sh...  Site Condition No complications  Blue Lumen Status Infusing;Flushed;Antimicrobial dead end cap;Blood return noted  Red Lumen Status Infusing;Flushed;Antimicrobial dead end cap;Blood return noted  Purple Lumen Status Saline locked  Catheter fill solution 4% Sodium Citrate  Catheter fill volume (Arterial) 1.2 cc  Catheter fill volume (Venous) 1.2  Dressing Type Transparent  Dressing Status Clean, Dry, Intact  Drainage Description None  Dressing Change Due 12/21/23  Post treatment catheter status Capped and Clamped

## 2023-12-16 NOTE — Progress Notes (Signed)
 IVT/Vast consult note:   Assessed R Non tunneled catheter. Pt stated painful near the puncture site; no redness or drainage noted; dressing dry/ intact. 3rd lumen ( pigtail) flushes well w/ NS w/ good blood return. Recommended cxray for placement verification, pt stated will have RN performing plasma pharesis procedure today assessed area and wants to hold off on getting xray done. She wants to wait and continue to observe for changes and if more painful and getting worse ; will have Rn order for cxray. Unit RN notified.

## 2023-12-16 NOTE — Progress Notes (Signed)
 Patient left unit for HD. CCMD notified.

## 2023-12-16 NOTE — Progress Notes (Signed)
 PROGRESS NOTE    Patricia Patton  FMW:[id number] DOB: [birth year] DOA: 12/06/2023 PCP: Claudene Rayfield HERO, MD  No chief complaint on file.   Brief Narrative:   53 yo with hx R sided trigeminal neuralgia, anxiety, depression, and multiple other medical issues transferred from [facility], now being followed by neurology with concern for transverse myelitis.   Assessment & Plan:   Principal Problem:   Neuromyelitis optica spectrum disorder (HCC) Active Problems:   Anxiety and depression   Abnormal finding on GI tract imaging   Grade II internal hemorrhoids  Acute Transverse Myelitis  - MRI with abnormal increased T2 signal within the spinal cord extending from cervical medullary junction to the mid body of C6, with abnormal enhancement posterolaterally on the R at cervical medullary junction and posterolaterally on the L at C1 and C2 vertebral bodies c/w demyelinating disease - MRI T spine without demyelinating disease.  MRI L spine without spinal canal stenosis or neural foraminal narrowing in lumbar spine.  MRI orbits without evidence of acute or prior optic neuritis.  MRI brain with abnormal cervical findings noted concerning for demyelinating disease. - repeat MRI orbits with ? Focal atrophy of distal L optic nerve (no significant abnormal enhancement to suggest active or acute optic neuritis) - s/p LP with elevated WBC count, lymph predominant.  Protein 31, Glucose 83.  Negative meningitis/encephalitis panel.  Pending oligoclonal bands. - negative autoimmune myelopathy panel - negative neuromyelitis optica ab - negative aqp4 ab and mog ab - negative antiextractable nuclear ag, negative anca, negative ANA - appreciate neurology recs - follow CSF myelitis panel, mayo test code MAC1.  Gabapentin  900 mg TID.  Carbamazepine  decreased to home regimen.  PLEX per neurology (platelets >150 today).  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   - ophtho saw and exam benign without evdience of optic neuritis or other pathology requiring urgent intervention  Trigeminal Neuralgia - appreciate neurology assistance - continuing tegretol , gabapentin   Acne - started on flagyl cream  Irregular Bowel Wall thickening concerning for malignancy - s/p colonoscopy which was normal  Depression  Anxiety - cymbalta , trazodone   Small Fiber Neuropathy - continue cymbalta , gabapentin     DVT prophylaxis: SCD Code Status: full Family Communication: none Disposition:   Status is: Inpatient Remains inpatient appropriate because: need for continued inpatient care   Consultants:  Neurology GI ID Ophtho   Procedures:  Colonoscopy - The entire examined colon is normal. - Ascending colon was normal- appearing on anterograde and retroflexed views. Made several passes through the right colon and no pathology noted. - Non- bleeding internal hemorrhoids. - The examined portion of the ileum was normal. - No specimens collected. Recommendation: - Return patient to hospital ward for ongoing care. - Advance diet as tolerated. - Continue present medications. - Repeat colonoscopy in 10 years for screening purposes. - Return to GI office PRN. - Inpatient GI service will sign off at this time. Please do not hesitate to contact us  with additional questions or concerns.  Antimicrobials:  Anti-infectives (From admission, onward)    None       Subjective: Feels Patricia Patton little better today, but persistent pain/discomfort   Objective: Vitals:   12/16/23 1009 12/16/23 1125 12/16/23 1312 12/16/23 1609  BP: 101/60 (!) 103/59  94/62  Pulse: 90 89  92  Resp: 18 20  18   Temp:  99.7 F (37.6 C)  98.6 F (37 C)  TempSrc:  Oral  Oral  SpO2: 96% 95%  95%  Weight:   62.2 kg   Height:        Intake/Output Summary (Last 24 hours) at 12/16/2023 1623 Last data filed at 12/16/2023 1310 Gross per 24 hour  Intake 660 ml  Output --  Net 660  ml    Filed Weights   12/11/23 1016 12/13/23 0817 12/16/23 1312  Weight: 62.6 kg 59 kg 62.2 kg    Examination:  General: No acute distress. Lungs: unlabored Neurological: Alert and oriented 3. Moves all extremities 4 with equal strength. Cranial nerves II through XII grossly intact.. Extremities: No clubbing or cyanosis. No edema.  Data Reviewed: I have personally reviewed following labs and imaging studies  CBC: Recent Labs  Lab 12/12/23 0200 12/13/23 0545 12/14/23 0944 12/15/23 0154 12/16/23 0925  WBC 11.1* 8.9 6.9 7.9 6.6  NEUTROABS 6.4 5.1 4.4 3.8 4.1  HGB 12.4 12.3 11.9* 11.9* 10.5*  HCT 37.3 36.5 36.4 36.1 32.5*  MCV 90.5 89.5 91.5 92.6 93.1  PLT 121* 110* 123* 141* 162    Basic Metabolic Panel: Recent Labs  Lab 12/10/23 0515 12/11/23 0443 12/12/23 0200 12/15/23 0154  NA 143 141 143 140  K 3.7 3.1* 3.9 4.4  CL 108 106 102 103  CO2 25 27 28 29   GLUCOSE 153* 86 109* 127*  BUN 13 14 10 16   CREATININE 0.73 0.75 0.83 0.73  CALCIUM  8.8* 8.2* 8.7* 8.6*  MG  --   --   --  2.3  PHOS  --   --   --  4.4    GFR: Estimated Creatinine Clearance: 71.3 mL/min (by C-G formula based on SCr of 0.73 mg/dL).  Liver Function Tests: Recent Labs  Lab 12/15/23 0154  AST 29  ALT 47*  ALKPHOS 36*  BILITOT 0.6  PROT 5.3*  ALBUMIN  3.4*    CBG: Recent Labs  Lab 12/12/23 0133  GLUCAP 76     Recent Results (from the past 240 hours)  CSF culture w Gram Stain     Status: None   Collection Time: 12/08/23  1:31 PM   Specimen: CSF; Cerebrospinal Fluid  Result Value Ref Range Status   Specimen Description CSF  Final   Special Requests NONE  Final   Gram Stain   Final    WBC PRESENT, PREDOMINANTLY MONONUCLEAR NO ORGANISMS SEEN CYTOSPIN SMEAR NO RBC SEEN    Culture   Final    NO GROWTH 3 DAYS Performed at [facility], [address], [place], [place] [address]    Report Status 12/11/2023 FINAL  Final         Radiology Studies: No results  found.       Scheduled Meds:  acetaminophen   1,000 mg Oral Q8H   artificial tears  2 drop Both Eyes QID   calcium  carbonate  2 tablet Oral Q3H   carbamazepine   100 mg Oral TID   Chlorhexidine  Gluconate Cloth  6 each Topical Daily   DULoxetine   60 mg Oral Daily   gabapentin   900 mg Oral TID   metroNIDAZOLE   Topical BID   polyethylene glycol  17 g Oral BID   senna  1 tablet Oral QHS   sodium chloride  flush  10-40 mL Intracatheter Q12H   traZODone   150 mg Oral QHS   Continuous Infusions:  albumin  human 25 % 50 g in sodium chloride  0.9 %     anticoagulant sodium citrate     calcium  gluconate  citrate dextrose        LOS: 10 days    Time spent: over 30 min     Meliton Monte, MD [facility]   To contact the attending provider between 7A-7P or the covering provider during after hours 7P-7A, please log into the web site [email or web address] and access using universal [place] password for that web site. If you do not have the password, please call the hospital operator.  12/16/2023, 4:23 PM

## 2023-12-16 NOTE — Plan of Care (Signed)
  Problem: Health Behavior/Discharge Planning: Goal: Ability to manage health-related needs will improve Outcome: Progressing   Problem: Clinical Measurements: Goal: Will remain free from infection Outcome: Progressing   Problem: Clinical Measurements: Goal: Respiratory complications will improve Outcome: Progressing   Problem: Activity: Goal: Risk for activity intolerance will decrease Outcome: Progressing   Problem: Elimination: Goal: Will not experience complications related to bowel motility Outcome: Progressing   Problem: Pain Managment: Goal: General experience of comfort will improve and/or be controlled Outcome: Progressing   Problem: Safety: Goal: Ability to remain free from injury will improve Outcome: Progressing   Problem: Skin Integrity: Goal: Risk for impaired skin integrity will decrease Outcome: Progressing

## 2023-12-17 LAB — CBC WITH DIFFERENTIAL/PLATELET
Abs Immature Granulocytes: 0.07 K/uL (ref 0.00–0.07)
Basophils Absolute: 0.1 K/uL (ref 0.0–0.1)
Basophils Relative: 1 %
Eosinophils Absolute: 0.2 K/uL (ref 0.0–0.5)
Eosinophils Relative: 2 %
HCT: 33 % — ABNORMAL LOW (ref 36.0–46.0)
Hemoglobin: 10.8 g/dL — ABNORMAL LOW (ref 12.0–15.0)
Immature Granulocytes: 1 %
Lymphocytes Relative: 27 %
Lymphs Abs: 2.2 K/uL (ref 0.7–4.0)
MCH: 30.1 pg (ref 26.0–34.0)
MCHC: 32.7 g/dL (ref 30.0–36.0)
MCV: 91.9 fL (ref 80.0–100.0)
Monocytes Absolute: 1.1 K/uL — ABNORMAL HIGH (ref 0.1–1.0)
Monocytes Relative: 13 %
Neutro Abs: 4.8 K/uL (ref 1.7–7.7)
Neutrophils Relative %: 56 %
Platelets: 190 K/uL (ref 150–400)
RBC: 3.59 MIL/uL — ABNORMAL LOW (ref 3.87–5.11)
RDW: 11.9 % (ref 11.5–15.5)
WBC: 8.4 K/uL (ref 4.0–10.5)
nRBC: 0 % (ref 0.0–0.2)

## 2023-12-17 LAB — BASIC METABOLIC PANEL WITH GFR
Anion gap: 6 (ref 5–15)
BUN: 8 mg/dL (ref 6–20)
CO2: 28 mmol/L (ref 22–32)
Calcium: 8.8 mg/dL — ABNORMAL LOW (ref 8.9–10.3)
Chloride: 107 mmol/L (ref 98–111)
Creatinine, Ser: 0.59 mg/dL (ref 0.44–1.00)
GFR, Estimated: >60 mL/min (ref >60)
Glucose, Bld: 95 mg/dL (ref 70–99)
Potassium: 3.9 mmol/L (ref 3.5–5.1)
Sodium: 141 mmol/L (ref 135–145)

## 2023-12-17 NOTE — Plan of Care (Signed)
   Problem: Education: Goal: Knowledge of General Education information will improve Description Including pain rating scale, medication(s)/side effects and non-pharmacologic comfort measures Outcome: Progressing

## 2023-12-17 NOTE — Progress Notes (Signed)
 NEUROLOGY CONSULT FOLLOW UP NOTE   Date of service: December 17, 2023 Patient Name: Patricia Patton MRN:  [id number] DOB:  11/17/1970    Review of HPI  53 y.o. female with hx of recently diagnosed right sided trigeminal neuralgia as well as anxiety and depression who presented to the ED with worsening right sided facial pain and bilateral shoulder pain.  She was in her usual state of health until 2 months ago.  She had no pre-existing neurologic conditions other than numbness in her fingertips and her toes which had been previously diagnosed by outside neurologist as small fiber neuropathy.  She began to have severe paroxysmal facial pain in her right face initially around the V1 region and then spreading to V2 and V3.  The paroxysms of pain were crippling and 10 out of 10 in severity.  They would be triggered reliably by touching her face or brushing her hair.  She initially sought care at [facility] and they suspected that she may be developing shingles and that her rash had just not shown yet.  She was treated with Valtrex and increasing doses of gabapentin .  She never did develop a rash.  The trigeminal neuralgia-like pain then spread to the left V1 region.  The right sided pain was still with a paroxysmal component but was becoming near constant and she was unable to function.  Prior to admission she was taking gabapentin  900 mg 3 times daily and carbamazepine  100 mg 3 times daily without significant benefit.  She denied any other neurologic symptoms although she reported pain and mild weakness in her neck and shoulders.  On initial neurological exam at [facility] on  9/23, she had bilateral deltoid weakness and was lethargic secondary to narcotics administered in the ED.  MRI brain was performed which showed no significant intracranial abnormalities. MRI C-spine did show abnormal T2 signal throughout the visualized cervical cord and abnormal enhancement along the right lateral and left posterolateral surfaces  of the upper cervical spinal cord most concerning for a transverse myelitis.   Interval Hx/subjective  Patient continues to be hemodynamically stable.She received PLEX treatment #4 yesterday. Platelet count is 190 this morning.  She has no visual changes and continues to complain of headache in the top of her head on the right side rated 7-8/10 with pain 5/10 on the left side of the face  Vitals   Vitals:   12/16/23 2000 12/17/23 0119 12/17/23 0345 12/17/23 0912  BP:  93/65 102/65 (!) 91/59  Pulse:  86 76 84  Resp:  18 19 17   Temp:  98 F (36.7 C) 98.3 F (36.8 C) 97.6 F (36.4 C)  TempSrc:  Oral Oral Oral  SpO2: 98% 94% 96% 95%  Weight:      Height:         Body mass index is 25.08 kg/m.  Physical Exam   Constitutional: Appears well-developed and well-nourished.  Psych: Affect somewhat blunted but appropriate to situation Eyes: No scleral injection.  HENT: No OP obstrucion.  Head: Normocephalic.  Respiratory: Effort normal, non-labored breathing.   Neurologic Examination   Mental Status: Awake, alert and oriented. Speech clear and fluent  Cranial Nerves: II: PERRL. Fixates and tracks normally.    III,IV, VI: No ptosis. EOMI. No nystagmus.  V: Pins and needle sensation noted with touch on the right side of the face, sensation more normal on the left VII: Subtle right facial weakness, lower quadrant (patient states this is chronic due to prior Bell's palsy about  10 years ago) VIII: Hearing intact to conversation IX,X: No hoarseness or hypophonia XI: Symmetric XII: Midline tongue extension Motor: Right :  Upper extremity   5/5                                      Left:     Upper extremity   5/5             Lower extremity   5/5                                                  Lower extremity   5/5 No pronator drift. Muscle tone and bulk is normal x 4.  Sensory: Sensation intact to light touch and symmetrical in bilateral extremities Cerebellar: No ataxia with FNF  bilaterally. No tremor noted.  Gait: Deferred  Medications  Current Facility-Administered Medications:    acetaminophen  (TYLENOL ) tablet 1,000 mg, 1,000 mg, Oral, Q8H, 1,000 mg at 12/17/23 0633 **FOLLOWED BY** [START ON 12/19/2023] acetaminophen  (TYLENOL ) tablet 650 mg, 650 mg, Oral, Q6H PRN, Perri DELENA Meliton Mickey., MD   artificial tears ophthalmic solution 2 drop, 2 drop, Both Eyes, QID, Perri DELENA Meliton Mickey., MD, 2 drop at 12/17/23 9096   carbamazepine  (TEGRETOL ) chewable tablet 100 mg, 100 mg, Oral, TID, Samtani, Jai-Gurmukh, MD, 100 mg at 12/17/23 0902   Chlorhexidine  Gluconate Cloth 2 % PADS 6 each, 6 each, Topical, Daily, Samtani, Jai-Gurmukh, MD, 6 each at 12/15/23 2200   citrate dextrose  (ACD-A  anticoagulant) solution 1,000 mL, 1,000 mL, Other, Continuous, Merrianne Locus, MD, 1,000 mL at 12/16/23 1811   diphenhydrAMINE  (BENADRYL ) capsule 25 mg, 25 mg, Oral, Q6H PRN, Perri DELENA Meliton Mickey., MD, 25 mg at 12/16/23 1212   diphenhydrAMINE  (BENADRYL ) capsule 25 mg, 25 mg, Oral, Q6H PRN, Meerab Maselli, MD, 25 mg at 12/17/23 9367   DULoxetine  (CYMBALTA ) DR capsule 60 mg, 60 mg, Oral, Daily, Samtani, Jai-Gurmukh, MD, 60 mg at 12/17/23 9097   gabapentin  (NEURONTIN ) capsule 900 mg, 900 mg, Oral, TID, Samtani, Jai-Gurmukh, MD, 900 mg at 12/17/23 0902   metroNIDAZOLE (METROGEL) 0.75 % gel, , Topical, BID, Samtani, Jai-Gurmukh, MD, Given at 12/17/23 0902   morphine (PF) 2 MG/ML injection 1 mg, 1 mg, Intravenous, Q2H PRN, Samtani, Jai-Gurmukh, MD, 1 mg at 12/16/23 2209   Oral care mouth rinse, 15 mL, Mouth Rinse, PRN, Perri DELENA Meliton Mickey., MD   oxyCODONE  (Oxy IR/ROXICODONE ) immediate release tablet 5 mg, 5 mg, Oral, Q4H PRN **OR** oxyCODONE  (Oxy IR/ROXICODONE ) immediate release tablet 10 mg, 10 mg, Oral, Q4H PRN, Perri DELENA Meliton Mickey., MD, 10 mg at 12/17/23 9367   polyethylene glycol (MIRALAX / GLYCOLAX) packet 17 g, 17 g, Oral, BID, Samtani, Jai-Gurmukh, MD, 17 g at 12/16/23 9171   senna (SENOKOT)  tablet 8.6 mg, 1 tablet, Oral, QHS, Samtani, Jai-Gurmukh, MD, 8.6 mg at 12/16/23 2205   sodium chloride  flush (NS) 0.9 % injection 10-40 mL, 10-40 mL, Intracatheter, Q12H, Perri DELENA Meliton Mickey., MD, 10 mL at 12/17/23 9096   sodium chloride  flush (NS) 0.9 % injection 10-40 mL, 10-40 mL, Intracatheter, PRN, Perri DELENA Meliton Mickey., MD   traZODone  (DESYREL ) tablet 150 mg, 150 mg, Oral, QHS, Samtani, Jai-Gurmukh, MD, 150 mg at 12/16/23 2205  Labs and Diagnostic Imaging   CBC:  Recent Labs  Lab 12/16/23 0925 12/17/23 0500  WBC 6.6 8.4  NEUTROABS 4.1 4.8  HGB 10.5* 10.8*  HCT 32.5* 33.0*  MCV 93.1 91.9  PLT 162 190    Basic Metabolic Panel:  Lab Results  Component Value Date   NA 141 12/17/2023   K 3.9 12/17/2023   CO2 28 12/17/2023   GLUCOSE 95 12/17/2023   BUN 8 12/17/2023   CREATININE 0.59 12/17/2023   CALCIUM  8.8 (L) 12/17/2023   GFRNONAA >60 12/17/2023   GFRAA >60 09/30/2018    INR  Lab Results  Component Value Date   INR 1.3 (H) 12/10/2023   APTT  Lab Results  Component Value Date   APTT 26 12/07/2023   Serum Autoimmune Myelopathy panel has resulted and is pan-negative: Anti-Hu Ab                     Negative                  BN     Reference Range: Negative                              This test was developed and its performance characteristics  determined by [facility]. It has not been cleared or approved  by the Food and Drug Administration.  Anti-Ri Ab                     Negative                  BN     Reference Range: Negative                              This test was developed and its performance characteristics  determined by [facility]. It has not been cleared or approved  by the Food and Drug Administration.  Antineuronal nuclear Ab Type 3 Negative                  BN     Reference Range: Negative                              This test was developed and its performance characteristics  determined by [facility]. It has not been cleared or approved  by the  Food and Drug Administration.  PCA Type-1 (Anti-Yo) Ab        Negative                  BN     Reference Range: Negative                              This test was developed and its performance characteristics  determined by [facility]. It has not been cleared or approved  by the Food and Drug Administration.  Purkinje Cell Cyto Ab Type 2   Negative                  BN     Reference Range: Negative                              This test was developed and its performance characteristics  determined by [facility]. It has  not been cleared or approved  by the Food and Drug Administration.  Purkinje Cell Cyto Ab Type Tr  Negative                  BN     Reference Range: Negative                              This test was developed and its performance characteristics  determined by [facility]. It has not been cleared or approved  by the Food and Drug Administration.  Amphiphysin Antibody           Negative                  BN     Reference Range: Negative                              This test was developed and its performance characteristics  determined by [facility]. It has not been cleared or approved  by the Food and Drug Administration.  CRMP-5 IgG                     Negative                  BN     Reference Range: Negative                              This test was developed and its performance characteristics  determined by [facility]. It has not been cleared or approved  by the Food and Drug Administration.  CRMP-5 IgG Line Blot           Negative                  BN     Reference Range: Negative                              This test was developed and its performance characteristics  determined by [facility]. It has not been cleared or approved  by the Food and Drug Administration.  AGNA-1                         Negative                  BN     Reference Range: Negative                              This test was developed and its performance characteristics  determined by [facility]. It has not  been cleared or approved  by the Food and Drug Administration.  DPPX Antibody                  Negative                  BN     Reference Range: Negative                              This test was developed and its performance characteristics  determined by [facility]. It has not been cleared or approved  by the  Food and Drug Administration.  mGluR1 Antibody                Negative                  BN     Reference Range: Negative                              This test was developed and its performance characteristics  determined by [facility]. It has not been cleared or approved  by the Food and Drug Administration.  AMPA-R1 Antibody               Negative                  BN     Reference Range: Negative                              This test was developed and its performance characteristics  determined by [facility]. It has not been cleared or approved  by the Food and Drug Administration.  AMPA-R2 Antibody               Negative                  BN     Reference Range: Negative                              This test was developed and its performance characteristics  determined by [facility]. It has not been cleared or approved  by the Food and Drug Administration.  GABA-B-R Antibody              Negative                  BN     Reference Range: Negative                              This test was developed and its performance characteristics  determined by [facility]. It has not been cleared or approved  by the Food and Drug Administration.  NMDA-R Antibody                Negative                  BN     Reference Range: Negative                              This test was developed and its performance characteristics  determined by [facility]. It has not been cleared or approved  by the Food and Drug Administration.  GAD65 Antibody                 Negative                  BN     Reference Range: Negative                              This test was developed and its performance characteristics   determined by [facility]. It has not been cleared or approved  by the Food and Drug Administration.  Aquaporin 4 Antibody  Negative                  BN     Reference Range: Negative                        CSF meningitis/encephalitis panel: Negative for all pathogens tested, including HSV1-2, VZV and HHV6   Assessment  Patricia Patton is a 53 y.o. 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 Her symptoms began with severe trigeminal neuralgia pain, which has been unremitting and resistant to medications.  - Exam today is as documented above. No limb weakness.  - Symptoms today: Headache has improved and is 7-8/10 in the top of her head and bilateral sides of the face.  We are hopeful that headache will continue to improve with more PLEX treatments. - Neuroimaging: - MRI orbits w/wo contrast: No evidence of acute or prior optic neuritis  - MRI brain w/wo contrast: No acute infarct. No acute intracranial hemorrhage. No mass effect or midline shift. No hydrocephalus. The sella is unremarkable. Normal flow voids. There is mild subcortical white matter disease present bilaterally - MRI C-spine w/wo contrast:  Abnormal T2 signal throughout the visualized cervical cord and abnormal enhancement along the right lateral and left posterolateral surfaces of the upper cervical spinal cord. The findings are concerning for demyelinating disease, but neoplasm cannot be excluded.  - MRI L-spine w/wo contrast: No spinal canal stenosis or neural foraminal narrowing in the lumbar spine.  - MRI T-spine w/wo contrast: No evidence of demyelinating disease involving the thoracic  spinal cord.  - Repeat MRI orbits w/wo contrast (9/30): Question focal atrophy involving the distal left optic nerve just posterior to the left globe. No significant abnormal enhancement to suggest active or acute optic neuritis at this time. Otherwise unremarkable MRI of the orbits. - EKG: Sinus tachycardia; Otherwise normal ECG - Labs: - CSF meningitis/encephalitis panel: Negative for all pathogens tested, including HSV1-2, VZV and HHV6 - CSF with elevated WBC of 17, predominantly lymphocytes. Elevated IgG index of 1.2. Total protein normal at 31.  - Serum Autoimmune Myelopathy panel has resulted and is pan-negative: - C-ANCA normal. P-ANCA normal. Atypical P-ANCA titer normal.  - ENA panel: - ENA SM Ab Ser-aCnc: Normal - Ribonucleic protein: Normal - Differential diagnosis: - Although she has negative anti-MOG antibodies, her MRI findings are typical for this entity and MOGAD (myelin-oligodendrocyte glycoprotein antibody-associated disease) can present with acute trigeminal neuralgia as well, based on multiple case reports documented in the literature. Although anti-MOG Ab titers do have high sensitivity of about 90%, MOGAD is still relatively high on the differential despite her negative titer due to the clinical and imaging characteristics.  - Anti-aqp4 antibodies were also negative, which makes neuromyelitis optica much less likely given that she has no brain lesions and no symptoms or signs of optic neuritis.   - At this point, even if this is not MOGAD, out suspicion that this is some type of autoimmune process is very high and for this reason she has been started on plasmapheresis. - Her LP shows mild inflammation as would be expected with an inflammatory myelitis.  - ID saw the patient on 10/1 and feel that  it is unlikely that the etiology of all of her neuro findings are secondary to HSV. They have called [facility] and have added on HSV1 and 2 IgG to her CSF fluid that had already been sent  out. They do not recommend starting anti virals at this time.  - CT chest abdomen pelvis looking for possible paraneoplastic association, in conjunction with colonoscopy, are negative for a mass lesion (possible apple-core lesion on CT was refuted by colonoscopy).   - Platelets initially were normal, then declined with patient thrombocytopenic. Last normal platelet count was 203 on 9/28, subsequently 148 (9/29) >> 121 (9/30) >> 110 >> 123 >> 141 >> 162 >> 190(10/5). Now trending upwards after lowering of her CBZ dose, but may also be due to holding her PLEX treatments. DDx includes secondary to carbamazepine  versus a rare complication of PLEX.  - Complained of recurrence of blurred vision on Tuesday. DDx includes due to NMO versus MOGAD versus secondary to headache pain. Her blurred vision was worse with either eye closed, better with binocular vision. Dr. Corbin of Ophthalmology ordered MRI orbits although received steroids so not really sure what else to do.  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 HSV can also be associated with transverse myelitis (herpes zoster myelitis), but CSF HSV PCR was negative.  - Severe trigeminal neuralgia of recent onset. Possible etiologies include autoimmune, versus secondary to HSV (although CSF HSV PCR was negative).   Recommendations  - Follow-up CSF myelitis panel, Mayo test code MAC1 - SSA, SSBA titers pending  - Continue gabapentin  900 mg 3 times daily - Decreased carbamazepine  back to her home dosage regimen on Tuesday, as it has been ineffective at controlling her pain and most likely, at the high dose, was the cause for her thrombocytopenia.  - Escalate opiate pain medication as needed for her intractable trigeminal neuralgia pain - Platelets today normalized to 190. Proceed with PLEX treatment #5 tomorrow.  - Continue to monitor her vision for any changes - After PLEX  #5, will need to obtain repeat MRIs of the brain and C-spine w/wo contrast.  - Neurology will continue to follow ______________________________________________________________________  Patient seen by NP and then by MD, MD to edit note as needed. Cortney E Everitt Clint Kill , MSN, AGACNP-BC [facility] See Amion for schedule and pager information 12/17/2023 9:55 AM  Electronically signed: Dr. Paulett Kaufhold

## 2023-12-17 NOTE — Progress Notes (Signed)
 PROGRESS NOTE    Patricia Patton  FMW:[id number] DOB: August 23, 1970 DOA: 12/06/2023 PCP: Claudene Rayfield HERO, MD  No chief complaint on file.   Brief Narrative:   53 yo with hx R sided trigeminal neuralgia, anxiety, depression, and multiple other medical issues transferred from [facility], now being followed by neurology with concern for transverse myelitis.   Assessment & Plan:   Principal Problem:   Neuromyelitis optica spectrum disorder (HCC) Active Problems:   Anxiety and depression   Abnormal finding on GI tract imaging   Grade II internal hemorrhoids  Acute Transverse Myelitis  - MRI with abnormal increased T2 signal within the spinal cord extending from cervical medullary junction to the mid body of C6, with abnormal enhancement posterolaterally on the R at cervical medullary junction and posterolaterally on the L at C1 and C2 vertebral bodies c/w demyelinating disease - MRI T spine without demyelinating disease.  MRI L spine without spinal canal stenosis or neural foraminal narrowing in lumbar spine.  MRI orbits without evidence of acute or prior optic neuritis.  MRI brain with abnormal cervical findings noted concerning for demyelinating disease. - repeat MRI orbits with ? Focal atrophy of distal L optic nerve (no significant abnormal enhancement to suggest active or acute optic neuritis) - s/p LP with elevated WBC count, lymph predominant.  Protein 31, Glucose 83.  Negative meningitis/encephalitis panel.  Pending oligoclonal bands. - negative autoimmune myelopathy panel - negative neuromyelitis optica ab - negative aqp4 ab and mog ab - negative antiextractable nuclear ag, negative anca, negative ANA - appreciate neurology recs - follow CSF myelitis panel, mayo test code MAC1.  Gabapentin  900 mg TID.  Carbamazepine  decreased to home regimen.  PLEX per neurology, plan for #5 tmrw (platelets >150 today).  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   - ophtho saw and exam benign without evdience of optic neuritis or other pathology requiring urgent intervention  Trigeminal Neuralgia - appreciate neurology assistance - continuing tegretol , gabapentin   Acne - started on flagyl cream  Irregular Bowel Wall thickening concerning for malignancy - s/p colonoscopy which was normal  Depression  Anxiety - cymbalta , trazodone   Small Fiber Neuropathy - continue cymbalta , gabapentin     DVT prophylaxis: SCD Code Status: full Family Communication: none Disposition:   Status is: Inpatient Remains inpatient appropriate because: need for continued inpatient care   Consultants:  Neurology GI ID Ophtho   Procedures:  Colonoscopy - The entire examined colon is normal. - Ascending colon was normal- appearing on anterograde and retroflexed views. Made several passes through the right colon and no pathology noted. - Non- bleeding internal hemorrhoids. - The examined portion of the ileum was normal. - No specimens collected. Recommendation: - Return patient to hospital ward for ongoing care. - Advance diet as tolerated. - Continue present medications. - Repeat colonoscopy in 10 years for screening purposes. - Return to GI office PRN. - Inpatient GI service will sign off at this time. Please do not hesitate to contact us  with additional questions or concerns.  Antimicrobials:  Anti-infectives (From admission, onward)    None       Subjective: Pain is the same   Objective: Vitals:   12/17/23 0345 12/17/23 0912 12/17/23 1129 12/17/23 1516  BP: 102/65 (!) 91/59 107/62 (!) 100/58  Pulse: 76 84 89 93  Resp: 19 17 16 18   Temp: 98.3 F (36.8 C) 97.6 F (36.4 C) 97.7 F (36.5  C) 98 F (36.7 C)  TempSrc: Oral Oral Oral Oral  SpO2: 96% 95% 95% 94%  Weight:      Height:        Intake/Output Summary (Last 24 hours) at 12/17/2023 1547 Last data filed at 12/17/2023 1300 Gross per 24 hour  Intake 960  ml  Output --  Net 960 ml    Filed Weights   12/13/23 0817 12/16/23 1312 12/16/23 1816  Weight: 59 kg 62.2 kg 62.2 kg    Examination:  General: appears uncomfortable, limited movement due to discomfort Lungs: unlabored Neurological: Alert and oriented 3. Moves all extremities 4 with equal strength. Cranial nerves II through XII grossly intact. Extremities: No clubbing or cyanosis. No edema.  Data Reviewed: I have personally reviewed following labs and imaging studies  CBC: Recent Labs  Lab 12/13/23 0545 12/14/23 0944 12/15/23 0154 12/16/23 0925 12/17/23 0500  WBC 8.9 6.9 7.9 6.6 8.4  NEUTROABS 5.1 4.4 3.8 4.1 4.8  HGB 12.3 11.9* 11.9* 10.5* 10.8*  HCT 36.5 36.4 36.1 32.5* 33.0*  MCV 89.5 91.5 92.6 93.1 91.9  PLT 110* 123* 141* 162 190    Basic Metabolic Panel: Recent Labs  Lab 12/11/23 0443 12/12/23 0200 12/15/23 0154 12/17/23 0500  NA 141 143 140 141  K 3.1* 3.9 4.4 3.9  CL 106 102 103 107  CO2 27 28 29 28   GLUCOSE 86 109* 127* 95  BUN 14 10 16 8   CREATININE 0.75 0.83 0.73 0.59  CALCIUM  8.2* 8.7* 8.6* 8.8*  MG  --   --  2.3  --   PHOS  --   --  4.4  --     GFR: Estimated Creatinine Clearance: 71.3 mL/min (by C-G formula based on SCr of 0.59 mg/dL).  Liver Function Tests: Recent Labs  Lab 12/15/23 0154  AST 29  ALT 47*  ALKPHOS 36*  BILITOT 0.6  PROT 5.3*  ALBUMIN  3.4*    CBG: Recent Labs  Lab 12/12/23 0133  GLUCAP 76     Recent Results (from the past 240 hours)  CSF culture w Gram Stain     Status: None   Collection Time: 12/08/23  1:31 PM   Specimen: CSF; Cerebrospinal Fluid  Result Value Ref Range Status   Specimen Description CSF  Final   Special Requests NONE  Final   Gram Stain   Final    WBC PRESENT, PREDOMINANTLY MONONUCLEAR NO ORGANISMS SEEN CYTOSPIN SMEAR NO RBC SEEN    Culture   Final    NO GROWTH 3 DAYS Performed at [facility], [address] [place], [place] [address]    Report Status 12/11/2023  FINAL  Final         Radiology Studies: No results found.       Scheduled Meds:  acetaminophen   1,000 mg Oral Q8H   artificial tears  2 drop Both Eyes QID   carbamazepine   100 mg Oral TID   Chlorhexidine  Gluconate Cloth  6 each Topical Daily   DULoxetine   60 mg Oral Daily   gabapentin   900 mg Oral TID   metroNIDAZOLE   Topical BID   polyethylene glycol  17 g Oral BID   senna  1 tablet Oral QHS   sodium chloride  flush  10-40 mL Intracatheter Q12H   traZODone   150 mg Oral QHS   Continuous Infusions:  citrate dextrose        LOS: 11 days    Time spent: over 30 min  Meliton Monte, MD [facility]   To contact the attending provider between 7A-7P or the covering provider during after hours 7P-7A, please log into the web site [email or web address] and access using universal [place] password for that web site. If you do not have the password, please call the hospital operator.  12/17/2023, 3:47 PM

## 2023-12-18 ENCOUNTER — Inpatient Hospital Stay (HOSPITAL_COMMUNITY)

## 2023-12-18 DIAGNOSIS — G379 Demyelinating disease of central nervous system, unspecified: Secondary | ICD-10-CM

## 2023-12-18 LAB — BASIC METABOLIC PANEL WITH GFR
Anion gap: 10 (ref 5–15)
BUN: 8 mg/dL (ref 6–20)
CO2: 27 mmol/L (ref 22–32)
Calcium: 8.5 mg/dL — ABNORMAL LOW (ref 8.9–10.3)
Chloride: 104 mmol/L (ref 98–111)
Creatinine, Ser: 0.52 mg/dL (ref 0.44–1.00)
GFR, Estimated: >60 mL/min (ref >60)
Glucose, Bld: 87 mg/dL (ref 70–99)
Potassium: 3.5 mmol/L (ref 3.5–5.1)
Sodium: 141 mmol/L (ref 135–145)

## 2023-12-18 LAB — CBC
HCT: 30.3 % — ABNORMAL LOW (ref 36.0–46.0)
Hemoglobin: 9.9 g/dL — ABNORMAL LOW (ref 12.0–15.0)
MCH: 30.1 pg (ref 26.0–34.0)
MCHC: 32.7 g/dL (ref 30.0–36.0)
MCV: 92.1 fL (ref 80.0–100.0)
Platelets: 218 K/uL (ref 150–400)
RBC: 3.29 MIL/uL — ABNORMAL LOW (ref 3.87–5.11)
RDW: 11.9 % (ref 11.5–15.5)
WBC: 8.2 K/uL (ref 4.0–10.5)
nRBC: 0 % (ref 0.0–0.2)

## 2023-12-18 MED ORDER — CALCIUM CARBONATE ANTACID 500 MG PO CHEW
2.0000 | CHEWABLE_TABLET | ORAL | Status: AC
  Administered 2023-12-18 (×2): 400 mg via ORAL

## 2023-12-18 MED ORDER — CALCIUM GLUCONATE-NACL 2-0.675 GM/100ML-% IV SOLN
2.0000 g | Freq: Once | INTRAVENOUS | Status: AC
  Administered 2023-12-18: 2000 mg via INTRAVENOUS

## 2023-12-18 MED ORDER — GADOBUTROL 1 MMOL/ML IV SOLN
6.0000 mL | Freq: Once | INTRAVENOUS | Status: AC | PRN
Start: 2023-12-18 — End: 2023-12-18
  Administered 2023-12-18: 6 mL via INTRAVENOUS

## 2023-12-18 MED ORDER — ACETAMINOPHEN 325 MG PO TABS
ORAL_TABLET | ORAL | Status: AC
  Filled 2023-12-18: qty 2

## 2023-12-18 MED ORDER — CALCIUM GLUCONATE-NACL 2-0.675 GM/100ML-% IV SOLN
INTRAVENOUS | Status: AC
  Filled 2023-12-18: qty 100

## 2023-12-18 MED ORDER — HEPARIN SODIUM (PORCINE) 1000 UNIT/ML IJ SOLN
1000.0000 [IU] | Freq: Once | INTRAMUSCULAR | Status: AC
  Administered 2023-12-18: 2400 [IU]

## 2023-12-18 MED ORDER — ACD FORMULA A 0.73-2.45-2.2 GM/100ML VI SOLN
1000.0000 mL | Status: DC
  Administered 2023-12-18: 1000 mL
  Filled 2023-12-18: qty 1000

## 2023-12-18 MED ORDER — HEPARIN SODIUM (PORCINE) 1000 UNIT/ML IJ SOLN
INTRAMUSCULAR | Status: AC
  Filled 2023-12-18: qty 3

## 2023-12-18 MED ORDER — ACETAMINOPHEN 325 MG PO TABS: 650.0000 mg | ORAL_TABLET | ORAL | Status: DC | PRN

## 2023-12-18 MED ORDER — SODIUM CHLORIDE 0.9 % IV SOLN
INTRAVENOUS | Status: AC
  Filled 2023-12-18 (×3): qty 200

## 2023-12-18 MED ORDER — CALCIUM CARBONATE ANTACID 500 MG PO CHEW
CHEWABLE_TABLET | ORAL | Status: AC
  Filled 2023-12-18: qty 4

## 2023-12-18 MED ORDER — DIPHENHYDRAMINE HCL 25 MG PO CAPS
ORAL_CAPSULE | ORAL | Status: AC
  Filled 2023-12-18: qty 1

## 2023-12-18 NOTE — Progress Notes (Signed)
 Summary of TPE:   12/18/23 2014  Vitals  Temp 98.7 F (37.1 C)  Temp Source Oral  Pulse Rate 93  ECG Heart Rate 92  Resp (!) 21  BP 102/62  Oxygen Therapy  SpO2 95 %  Post-apheresis  Net Removed (mL) 2538 mL  Replacement (mL) 2357 mL  ACDA infused (mL) 458 mL  Total Normal Saline Administered 161 mL  Total Calcium  Administered 100 mL  Tolerated Procedure Yes  Post-Procedure Comments Pt tolerating treatment, no s/s of hypocalcemia, no s/s of reaction.  Hemodialysis Catheter Right Internal jugular Triple lumen Temporary (Non-Tunneled)  Placement Date/Time: 12/07/23 1124   Serial / Lot #: [id number]  Expiration Date: 10/12/26  Time Out: Correct patient;Correct site;Correct procedure  Maximum sterile barrier precautions: Hand hygiene;Cap;Mask;Sterile gown;Sterile gloves;Large sterile sh...  Site Condition No complications  Blue Lumen Status Flushed;Antimicrobial dead end cap;Heparin  locked  Red Lumen Status Flushed;Heparin  locked;Antimicrobial dead end cap  Purple Lumen Status Saline locked  Catheter fill solution Heparin  1000 units/ml  Catheter fill volume (Arterial) 1.2 cc  Catheter fill volume (Venous) 1.2  Dressing Type Transparent  Dressing Status Clean, Dry, Intact  Interventions Other (Comment) (assessed)  Drainage Description None  Dressing Change Due 12/21/23  Post treatment catheter status Capped and Clamped

## 2023-12-18 NOTE — Progress Notes (Signed)
 PROGRESS NOTE    Patricia Patton  FMW:[id number] DOB: [birth year] DOA: 12/06/2023 PCP: Claudene Rayfield HERO, MD  No chief complaint on file.   Brief Narrative:   53 yo with hx R sided trigeminal neuralgia, anxiety, depression, and multiple other medical issues transferred from [facility], now being followed by neurology with concern for transverse myelitis.   Assessment & Plan:   Principal Problem:   Neuromyelitis optica spectrum disorder (HCC) Active Problems:   Anxiety and depression   Abnormal finding on GI tract imaging   Grade II internal hemorrhoids  Acute Transverse Myelitis  - MRI with abnormal increased T2 signal within the spinal cord extending from cervical medullary junction to the mid body of C6, with abnormal enhancement posterolaterally on the R at cervical medullary junction and posterolaterally on the L at C1 and C2 vertebral bodies c/w demyelinating disease - MRI T spine without demyelinating disease.  MRI L spine without spinal canal stenosis or neural foraminal narrowing in lumbar spine.  MRI orbits without evidence of acute or prior optic neuritis.  MRI brain with abnormal cervical findings noted concerning for demyelinating disease. - repeat MRI orbits with ? Focal atrophy of distal L optic nerve (no significant abnormal enhancement to suggest active or acute optic neuritis) - s/p LP with elevated WBC count, lymph predominant.  Protein 31, Glucose 83.  Negative meningitis/encephalitis panel.  Pending oligoclonal bands. - negative autoimmune myelopathy panel - negative neuromyelitis optica ab - negative aqp4 ab and mog ab - negative antiextractable nuclear ag, negative anca, negative ANA - appreciate neurology recs - follow CSF myelitis panel, mayo test code MAC1.  Gabapentin  900 mg TID.  Carbamazepine  decreased to home regimen.  PLEX per neurology, plan for #5 tmrw (platelets >150 today).  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   - ophtho saw and exam benign without evdience of optic neuritis or other pathology requiring urgent intervention  Trigeminal Neuralgia - appreciate neurology assistance - continuing tegretol , gabapentin   Acne - started on flagyl cream  Irregular Bowel Wall thickening concerning for malignancy - s/p colonoscopy which was normal  Depression  Anxiety - cymbalta , trazodone   Small Fiber Neuropathy - continue cymbalta , gabapentin     DVT prophylaxis: SCD Code Status: full Family Communication: none Disposition:   Status is: Inpatient Remains inpatient appropriate because: need for continued inpatient care   Consultants:  Neurology GI ID Ophtho   Procedures:  Colonoscopy - The entire examined colon is normal. - Ascending colon was normal- appearing on anterograde and retroflexed views. Made several passes through the right colon and no pathology noted. - Non- bleeding internal hemorrhoids. - The examined portion of the ileum was normal. - No specimens collected. Recommendation: - Return patient to hospital ward for ongoing care. - Advance diet as tolerated. - Continue present medications. - Repeat colonoscopy in 10 years for screening purposes. - Return to GI office PRN. - Inpatient GI service will sign off at this time. Please do not hesitate to contact us  with additional questions or concerns.  Antimicrobials:  Anti-infectives (From admission, onward)    None       Subjective: Feels about the same, frustrated without any imminent discharge plans   Objective: Vitals:   12/17/23 2356 12/18/23 0344 12/18/23 0828 12/18/23 1342  BP: (!) 90/53 99/60 98/62  106/70  Pulse: 87 80 91 91  Resp: 16 17 16 18   Temp: 99.1 F (37.3 C) 98.7 F (  37.1 C) 97.6 F (36.4 C) 98 F (36.7 C)  TempSrc: Oral Oral Oral Oral  SpO2: 94% 95% 94% 99%  Weight:      Height:        Intake/Output Summary (Last 24 hours) at 12/18/2023 1442 Last data filed at  12/17/2023 1700 Gross per 24 hour  Intake 240 ml  Output --  Net 240 ml    Filed Weights   12/13/23 0817 12/16/23 1312 12/16/23 1816  Weight: 59 kg 62.2 kg 62.2 kg    Examination:  General: appears uncomfortable  Lungs: unlabored Neurological: Alert and oriented 3. Moves all extremities 4 with equal strength. Cranial nerves II through XII grossly intact. Extremities: No clubbing or cyanosis. No edema.  Data Reviewed: I have personally reviewed following labs and imaging studies  CBC: Recent Labs  Lab 12/13/23 0545 12/14/23 0944 12/15/23 0154 12/16/23 0925 12/17/23 0500 12/18/23 0610  WBC 8.9 6.9 7.9 6.6 8.4 8.2  NEUTROABS 5.1 4.4 3.8 4.1 4.8  --   HGB 12.3 11.9* 11.9* 10.5* 10.8* 9.9*  HCT 36.5 36.4 36.1 32.5* 33.0* 30.3*  MCV 89.5 91.5 92.6 93.1 91.9 92.1  PLT 110* 123* 141* 162 190 218    Basic Metabolic Panel: Recent Labs  Lab 12/12/23 0200 12/15/23 0154 12/17/23 0500 12/18/23 0610  NA 143 140 141 141  K 3.9 4.4 3.9 3.5  CL 102 103 107 104  CO2 28 29 28 27   GLUCOSE 109* 127* 95 87  BUN 10 16 8 8   CREATININE 0.83 0.73 0.59 0.52  CALCIUM  8.7* 8.6* 8.8* 8.5*  MG  --  2.3  --   --   PHOS  --  4.4  --   --     GFR: Estimated Creatinine Clearance: 71.3 mL/min (by C-G formula based on SCr of 0.52 mg/dL).  Liver Function Tests: Recent Labs  Lab 12/15/23 0154  AST 29  ALT 47*  ALKPHOS 36*  BILITOT 0.6  PROT 5.3*  ALBUMIN  3.4*    CBG: Recent Labs  Lab 12/12/23 0133  GLUCAP 76     No results found for this or any previous visit (from the past 240 hours).        Radiology Studies: No results found.       Scheduled Meds:  acetaminophen   1,000 mg Oral Q8H   artificial tears  2 drop Both Eyes QID   calcium  carbonate  2 tablet Oral Q3H   carbamazepine   100 mg Oral TID   Chlorhexidine  Gluconate Cloth  6 each Topical Daily   DULoxetine   60 mg Oral Daily   gabapentin   900 mg Oral TID   heparin  sodium (porcine)  1,000 Units  Intracatheter Once   metroNIDAZOLE   Topical BID   polyethylene glycol  17 g Oral BID   senna  1 tablet Oral QHS   sodium chloride  flush  10-40 mL Intracatheter Q12H   traZODone   150 mg Oral QHS   Continuous Infusions:  calcium  gluconate     citrate dextrose      citrate dextrose        LOS: 12 days    Time spent: over 30 min     Meliton Monte, MD [facility]   To contact the attending provider between 7A-7P or the covering provider during after hours 7P-7A, please log into the web site [email or web address] and access using universal [place] password for that web site. If you do not have the password, please call the hospital operator.  12/18/2023, 2:42  PM

## 2023-12-18 NOTE — Plan of Care (Signed)

## 2023-12-18 NOTE — TOC Progression Note (Signed)
 Transition of Care [facility]) - Progression Note    Patient Details  Name: Patricia Patton MRN: [id number] Date of Birth: 04-18-1970  [facility] CM/SW Contact  Andrez JULIANNA George, RN Phone Number: 12/18/2023, 1:28 PM  Clinical Narrative:    Pt continues with PLEX and will need MRI tomorrow. IP Care management following.   Expected Discharge Plan: Home/Self Care Barriers to Discharge: Continued Medical Work up               Expected Discharge Plan and Services   Discharge Planning Services: CM Consult   Living arrangements for the past 2 months: Single Family Home                                       Social Drivers of Health (SDOH) Interventions SDOH Screenings   Food Insecurity: No Food Insecurity (12/06/2023)  Housing: Low Risk  (12/06/2023)  Transportation Needs: No Transportation Needs (12/06/2023)  Utilities: Not At Risk (12/06/2023)  Financial Resource Strain: Low Risk  (07/30/2023)   Received from [facility]  Physical Activity: Insufficiently Active (08/13/2018)   Received from [facility]  Social Connections: Unknown (08/13/2018)   Received from [facility]  Tobacco Use: Medium Risk (12/12/2023)  Health Literacy: Low Risk  (06/20/2020)   Received from [facility]    Readmission Risk Interventions     No data to display

## 2023-12-18 NOTE — Plan of Care (Signed)
 I attempted to see patient in follow-up but she is OTF en route to dialysis for final PLEX session. Will see her again in the morning.  Elida Ross, MD [facility] [phone]  If 7pm- 7am, please page neurology on call as listed in AMION.

## 2023-12-19 LAB — BASIC METABOLIC PANEL WITH GFR
Anion gap: 9 (ref 5–15)
BUN: 6 mg/dL (ref 6–20)
CO2: 26 mmol/L (ref 22–32)
Calcium: 8.8 mg/dL — ABNORMAL LOW (ref 8.9–10.3)
Chloride: 107 mmol/L (ref 98–111)
Creatinine, Ser: 0.6 mg/dL (ref 0.44–1.00)
GFR, Estimated: >60 mL/min (ref >60)
Glucose, Bld: 87 mg/dL (ref 70–99)
Potassium: 3.6 mmol/L (ref 3.5–5.1)
Sodium: 142 mmol/L (ref 135–145)

## 2023-12-19 LAB — CBC
HCT: 29.9 % — ABNORMAL LOW (ref 36.0–46.0)
Hemoglobin: 9.8 g/dL — ABNORMAL LOW (ref 12.0–15.0)
MCH: 30.2 pg (ref 26.0–34.0)
MCHC: 32.8 g/dL (ref 30.0–36.0)
MCV: 92 fL (ref 80.0–100.0)
Platelets: 218 K/uL (ref 150–400)
RBC: 3.25 MIL/uL — ABNORMAL LOW (ref 3.87–5.11)
RDW: 11.9 % (ref 11.5–15.5)
WBC: 7.9 K/uL (ref 4.0–10.5)
nRBC: 0 % (ref 0.0–0.2)

## 2023-12-19 MED ORDER — BACLOFEN 10 MG PO TABS
5.0000 mg | ORAL_TABLET | Freq: Three times a day (TID) | ORAL | Status: DC
Start: 2023-12-19 — End: 2023-12-21
  Administered 2023-12-19 – 2023-12-20 (×4): 5 mg via ORAL
  Filled 2023-12-19 (×4): qty 1

## 2023-12-19 MED ORDER — ONDANSETRON HCL 4 MG PO TABS: 4.0000 mg | ORAL_TABLET | Freq: Three times a day (TID) | ORAL | Status: DC | PRN

## 2023-12-19 NOTE — Progress Notes (Signed)
 NEUROLOGY CONSULT FOLLOW UP NOTE   Date of service: December 19, 2023 Patient Name: Patricia Patton MRN:  [id number] DOB:  04/10/1970    Review of HPI  53 y.o. female with hx of recently diagnosed right sided trigeminal neuralgia as well as anxiety and depression who presented to the ED with worsening right sided facial pain and bilateral shoulder pain.  She was in her usual state of health until 2 months ago.  She had no pre-existing neurologic conditions other than numbness in her fingertips and her toes which had been previously diagnosed by outside neurologist as small fiber neuropathy.  She began to have severe paroxysmal facial pain in her right face initially around the V1 region and then spreading to V2 and V3.  The paroxysms of pain were crippling and 10 out of 10 in severity.  They would be triggered reliably by touching her face or brushing her hair.  She initially sought care at [facility] and they suspected that she may be developing shingles and that her rash had just not shown yet.  She was treated with Valtrex and increasing doses of gabapentin .  She never did develop a rash.  The trigeminal neuralgia-like pain then spread to the left V1 region.  The right sided pain was still with a paroxysmal component but was becoming near constant and she was unable to function.  Prior to admission she was taking gabapentin  900 mg 3 times daily and carbamazepine  100 mg 3 times daily without significant benefit.  She denied any other neurologic symptoms although she reported pain and mild weakness in her neck and shoulders.  On initial neurological exam at [facility] on  9/23, she had bilateral deltoid weakness and was lethargic secondary to narcotics administered in the ED.  MRI brain was performed which showed no significant intracranial abnormalities. MRI C-spine did show abnormal T2 signal throughout the visualized cervical cord and abnormal enhancement along the right lateral and left posterolateral surfaces  of the upper cervical spinal cord most concerning for a transverse myelitis.   Interval Hx/subjective  Patient completed PLEX #5 last night. Platelet count is 190 this morning.  She has no visual changes and continues to complain of headache in the top of her head on the right side rated 7-8/10 with pain 5/10 on the left side of the face  Vitals   Vitals:   12/19/23 0352 12/19/23 0811 12/19/23 1241 12/19/23 1650  BP: 102/62 (!) 94/58 94/66 102/65  Pulse: 75 92 83 91  Resp: 14 16 17 15   Temp: 98.2 F (36.8 C) 98.2 F (36.8 C) 98 F (36.7 C) 98.9 F (37.2 C)  TempSrc: Oral Oral Oral Oral  SpO2: 96% 94% 98% 94%  Weight:      Height:         Body mass index is 24.84 kg/m.  Physical Exam   Constitutional: Appears well-developed and well-nourished.  Psych: Affect somewhat blunted but appropriate to situation Eyes: No scleral injection.  HENT: No OP obstrucion.  Head: Normocephalic.  Respiratory: Effort normal, non-labored breathing.   Neurologic Examination   Mental Status: Awake, alert and oriented. Speech clear and fluent  Cranial Nerves: II: PERRL. Fixates and tracks normally.    III,IV, VI: No ptosis. EOMI. No nystagmus.  V: Pins and needle sensation noted with touch on the right side of the face, sensation more normal on the left VII: Subtle right facial weakness, lower quadrant (patient states this is chronic due to prior Bell's palsy about 10 years  ago) VIII: Hearing intact to conversation IX,X: No hoarseness or hypophonia XI: Symmetric XII: Midline tongue extension Motor: Right :  Upper extremity   5/5                                      Left:     Upper extremity   5/5             Lower extremity   5/5                                                  Lower extremity   5/5 No pronator drift. Muscle tone and bulk is normal x 4.  Sensory: Sensation intact to light touch and symmetrical in bilateral extremities Cerebellar: No ataxia with FNF bilaterally. No tremor  noted.  Gait: Deferred  Medications  Current Facility-Administered Medications:    [COMPLETED] acetaminophen  (TYLENOL ) tablet 1,000 mg, 1,000 mg, Oral, Q8H, 1,000 mg at 12/19/23 0635 **FOLLOWED BY** acetaminophen  (TYLENOL ) tablet 650 mg, 650 mg, Oral, Q6H PRN, Perri DELENA Meliton Mickey., MD   artificial tears ophthalmic solution 2 drop, 2 drop, Both Eyes, QID, Perri DELENA Meliton Mickey., MD, 2 drop at 12/19/23 1754   baclofen (LIORESAL) tablet 5 mg, 5 mg, Oral, TID, Matthews Elida HERO, MD   carbamazepine  (TEGRETOL ) chewable tablet 100 mg, 100 mg, Oral, TID, Samtani, Jai-Gurmukh, MD, 100 mg at 12/19/23 1754   Chlorhexidine  Gluconate Cloth 2 % PADS 6 each, 6 each, Topical, Daily, Samtani, Jai-Gurmukh, MD, 6 each at 12/18/23 9385   diphenhydrAMINE  (BENADRYL ) capsule 25 mg, 25 mg, Oral, Q6H PRN, Perri DELENA Meliton Mickey., MD, 25 mg at 12/19/23 1416   DULoxetine  (CYMBALTA ) DR capsule 60 mg, 60 mg, Oral, Daily, Samtani, Jai-Gurmukh, MD, 60 mg at 12/19/23 0947   gabapentin  (NEURONTIN ) capsule 900 mg, 900 mg, Oral, TID, Samtani, Jai-Gurmukh, MD, 900 mg at 12/19/23 1754   morphine (PF) 2 MG/ML injection 1 mg, 1 mg, Intravenous, Q2H PRN, Samtani, Jai-Gurmukh, MD, 1 mg at 12/18/23 0605   ondansetron  (ZOFRAN ) tablet 4 mg, 4 mg, Oral, Q8H PRN, Matthews Elida HERO, MD   Oral care mouth rinse, 15 mL, Mouth Rinse, PRN, Perri DELENA Meliton Mickey., MD   oxyCODONE  (Oxy IR/ROXICODONE ) immediate release tablet 5 mg, 5 mg, Oral, Q4H PRN **OR** oxyCODONE  (Oxy IR/ROXICODONE ) immediate release tablet 10 mg, 10 mg, Oral, Q4H PRN, Perri DELENA Meliton Mickey., MD, 10 mg at 12/19/23 1416   polyethylene glycol (MIRALAX / GLYCOLAX) packet 17 g, 17 g, Oral, BID, Samtani, Jai-Gurmukh, MD, 17 g at 12/16/23 9171   senna (SENOKOT) tablet 8.6 mg, 1 tablet, Oral, QHS, Samtani, Jai-Gurmukh, MD, 8.6 mg at 12/18/23 2129   sodium chloride  flush (NS) 0.9 % injection 10-40 mL, 10-40 mL, Intracatheter, Q12H, Perri DELENA Meliton Mickey., MD, 10 mL at 12/19/23 0948    sodium chloride  flush (NS) 0.9 % injection 10-40 mL, 10-40 mL, Intracatheter, PRN, Perri DELENA Meliton Mickey., MD, 10 mL at 12/18/23 2137   traZODone  (DESYREL ) tablet 150 mg, 150 mg, Oral, QHS, Samtani, Jai-Gurmukh, MD, 150 mg at 12/18/23 2129  Labs and Diagnostic Imaging   CBC:  Recent Labs  Lab 12/16/23 0925 12/17/23 0500 12/18/23 0610 12/19/23 0640  WBC 6.6 8.4 8.2 7.9  NEUTROABS 4.1 4.8  --   --  HGB 10.5* 10.8* 9.9* 9.8*  HCT 32.5* 33.0* 30.3* 29.9*  MCV 93.1 91.9 92.1 92.0  PLT 162 190 218 218    Basic Metabolic Panel:  Lab Results  Component Value Date   NA 142 12/19/2023   K 3.6 12/19/2023   CO2 26 12/19/2023   GLUCOSE 87 12/19/2023   BUN 6 12/19/2023   CREATININE 0.60 12/19/2023   CALCIUM  8.8 (L) 12/19/2023   GFRNONAA >60 12/19/2023   GFRAA >60 09/30/2018    INR  Lab Results  Component Value Date   INR 1.3 (H) 12/10/2023   APTT  Lab Results  Component Value Date   APTT 26 12/07/2023   Serum Autoimmune Myelopathy panel has resulted and is pan-negative: Anti-Hu Ab                     Negative                  BN     Reference Range: Negative                              This test was developed and its performance characteristics  determined by [facility]. It has not been cleared or approved  by the Food and Drug Administration.  Anti-Ri Ab                     Negative                  BN     Reference Range: Negative                              This test was developed and its performance characteristics  determined by [facility]. It has not been cleared or approved  by the Food and Drug Administration.  Antineuronal nuclear Ab Type 3 Negative                  BN     Reference Range: Negative                              This test was developed and its performance characteristics  determined by [facility]. It has not been cleared or approved  by the Food and Drug Administration.  PCA Type-1 (Anti-Yo) Ab        Negative                  BN     Reference Range:  Negative                              This test was developed and its performance characteristics  determined by [facility]. It has not been cleared or approved  by the Food and Drug Administration.  Purkinje Cell Cyto Ab Type 2   Negative                  BN     Reference Range: Negative                              This test was developed and its performance characteristics  determined by [facility]. It has not been cleared or approved  by the Food  and Drug Administration.  Purkinje Cell Cyto Ab Type Tr  Negative                  BN     Reference Range: Negative                              This test was developed and its performance characteristics  determined by [facility]. It has not been cleared or approved  by the Food and Drug Administration.  Amphiphysin Antibody           Negative                  BN     Reference Range: Negative                              This test was developed and its performance characteristics  determined by [facility]. It has not been cleared or approved  by the Food and Drug Administration.  CRMP-5 IgG                     Negative                  BN     Reference Range: Negative                              This test was developed and its performance characteristics  determined by [facility]. It has not been cleared or approved  by the Food and Drug Administration.  CRMP-5 IgG Line Blot           Negative                  BN     Reference Range: Negative                              This test was developed and its performance characteristics  determined by [facility]. It has not been cleared or approved  by the Food and Drug Administration.  AGNA-1                         Negative                  BN     Reference Range: Negative                              This test was developed and its performance characteristics  determined by [facility]. It has not been cleared or approved  by the Food and Drug Administration.  DPPX Antibody                  Negative                   BN     Reference Range: Negative                              This test was developed and its performance characteristics  determined by [facility]. It has not been cleared or approved  by the Food and Drug Administration.  mGluR1 Antibody  Negative                  BN     Reference Range: Negative                              This test was developed and its performance characteristics  determined by [facility]. It has not been cleared or approved  by the Food and Drug Administration.  AMPA-R1 Antibody               Negative                  BN     Reference Range: Negative                              This test was developed and its performance characteristics  determined by [facility]. It has not been cleared or approved  by the Food and Drug Administration.  AMPA-R2 Antibody               Negative                  BN     Reference Range: Negative                              This test was developed and its performance characteristics  determined by [facility]. It has not been cleared or approved  by the Food and Drug Administration.  GABA-B-R Antibody              Negative                  BN     Reference Range: Negative                              This test was developed and its performance characteristics  determined by [facility]. It has not been cleared or approved  by the Food and Drug Administration.  NMDA-R Antibody                Negative                  BN     Reference Range: Negative                              This test was developed and its performance characteristics  determined by [facility]. It has not been cleared or approved  by the Food and Drug Administration.  GAD65 Antibody                 Negative                  BN     Reference Range: Negative                              This test was developed and its performance characteristics  determined by [facility]. It has not been cleared or approved  by the Food and Drug Administration.  Aquaporin 4  Antibody           Negative  BN     Reference Range: Negative                        CSF meningitis/encephalitis panel: Negative for all pathogens tested, including HSV1-2, VZV and HHV6   Assessment  Verlia Kaney is a 53 y.o. 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 Her symptoms began with severe trigeminal neuralgia pain, which has been unremitting and resistant to medications.  - Exam today is as documented above. No limb weakness.  - Symptoms today: Headache has improved and is 7-8/10 in the top of her head and bilateral sides of the face.  We are hopeful that headache will continue to improve with more PLEX treatments. - Neuroimaging: - MRI orbits w/wo contrast: No evidence of acute or prior optic neuritis  - MRI brain w/wo contrast: No acute infarct. No acute intracranial hemorrhage. No mass effect or midline shift. No hydrocephalus. The sella is unremarkable. Normal flow voids. There is mild subcortical white matter disease present bilaterally - MRI C-spine w/wo contrast:  Abnormal T2 signal throughout the visualized cervical cord and abnormal enhancement along the right lateral and left posterolateral surfaces of the upper cervical spinal cord. The findings are concerning for demyelinating disease, but neoplasm cannot be excluded.  - MRI L-spine w/wo contrast: No spinal canal stenosis or neural foraminal narrowing in the lumbar spine.  - MRI T-spine w/wo contrast: No evidence of demyelinating disease involving the thoracic spinal cord.  - Repeat MRI orbits w/wo contrast (9/30): Question focal atrophy involving the distal left optic  nerve just posterior to the left globe. No significant abnormal enhancement to suggest active or acute optic neuritis at this time. Otherwise unremarkable MRI of the orbits. - EKG: Sinus tachycardia; Otherwise normal ECG - Labs: - CSF meningitis/encephalitis panel: Negative for all pathogens tested, including HSV1-2, VZV and HHV6 - CSF with elevated WBC of 17, predominantly lymphocytes. Elevated IgG index of 1.2. Total protein normal at 31.  - Serum Autoimmune Myelopathy panel has resulted and is pan-negative: - C-ANCA normal. P-ANCA normal. Atypical P-ANCA titer normal.  - ENA panel: - ENA SM Ab Ser-aCnc: Normal - Ribonucleic protein: Normal - Differential diagnosis: - Although she has negative anti-MOG antibodies, her MRI findings are typical for this entity and MOGAD (myelin-oligodendrocyte glycoprotein antibody-associated disease) can present with acute trigeminal neuralgia as well, based on multiple case reports documented in the literature. Although anti-MOG Ab titers do have high sensitivity of about 90%, MOGAD is still relatively high on the differential despite her negative titer due to the clinical and imaging characteristics.  - Anti-aqp4 antibodies were also negative, which makes neuromyelitis optica much less likely given that she has no brain lesions and no symptoms or signs of optic neuritis.   - At this point, even if this is not MOGAD, out suspicion that this is some type of autoimmune process is very high and for this reason she has been started on plasmapheresis. - Her LP shows mild inflammation as would be expected with an inflammatory myelitis.  - ID saw the patient on 10/1 and feel that it is unlikely that the etiology of all of her neuro findings are secondary to HSV. They have  called [facility] and have added on HSV1 and 2 IgG to her CSF fluid that had already been sent out. They do not recommend starting anti virals at this time.  - CT chest abdomen pelvis looking for possible  paraneoplastic association, in conjunction with colonoscopy, are negative for a mass lesion (possible apple-core lesion on CT was refuted by colonoscopy).   - Platelets initially were normal, then declined with patient thrombocytopenic. Last normal platelet count was 203 on 9/28, subsequently 148 (9/29) >> 121 (9/30) >> 110 >> 123 >> 141 >> 162 >> 190(10/5). Now trending upwards after lowering of her CBZ dose, but may also be due to holding her PLEX treatments. DDx includes secondary to carbamazepine  versus a rare complication of PLEX.  - Complained of recurrence of blurred vision on Tuesday. DDx includes due to NMO versus MOGAD versus secondary to headache pain. Her blurred vision was worse with either eye closed, better with binocular vision. Dr. Corbin of Ophthalmology ordered MRI orbits although received steroids so not really sure what else to do.  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 HSV can also be associated with transverse myelitis (herpes zoster myelitis), but CSF HSV PCR was negative.  - Severe trigeminal neuralgia of recent onset. Possible etiologies include autoimmune, versus secondary to HSV (although CSF HSV PCR was negative).   Update 10/7: Patient has completed 5 of 5 sessions of PLEX. TN pain is slightly improved but still severe and she needs better pain control before discharge. Repeat MRI brain and c spine showed improvement in abnormal T2/FLAIR signal from the cervicomedullary junction to T6 with some residual enhancement (residual enhancement may persist for weeks with or without treatment). I reviewed the actual images from the 9/24 and 10/6 scans with patient and her husband at bedside. Her carbamazepine  was reduced from 200 tid to 100 tid 2/2 thrombocytopenia and poor efficacy and her gabapentin  and cymbalta  are unchanged from home doses. I told her that unfortunately I do not expect to be able  to get her completely pain-free prior to discharge but we do need a better regimen in terms of her near-term quality of life and also to reduce unnecessary use of opiates.  I also explained that we cannot make too many medication changes that once otherwise we will not know what is effective or what is causing side effects.  We agreed to start baclofen 5 mg 3 times daily today.  If she tolerates this well we can increase to 10mg  tid in 3-5 days. Prior to discharge I would also like to start her on lamotrigine  which can be uptitrated in clinic and has shown efficacy in clinical trials at up to 400mg  total daily dose (split bid).  Lamotrigine  titration has to be slow due to risk of Stevens-Johnson syndrome.  Lamotrigine  does however interact with carbamazepine  therefore instead of a starting dose of lamotrigine  of 25 mg daily she will be started on 50 mg daily.  In terms of rescue medications after discharge intranasal lidocaine  spray is one of the most effective options.  This can only be compounded at a specialty pharmacy.  I do not know if this is something that I will be able to prescribe at discharge or if this needs to be done in clinic potentially with a prior authorization.  Will investigate.  After discharge she will need close follow-up with Dr[facility] at [facility].  Recommendations  - Follow-up CSF myelitis panel, Mayo test  code MAC1 - SSA, SSBA titers pending  - Continue gabapentin  900 mg 3 times daily - Continue carbamazepine  100mg  tid - Start baclofen 5mg  tid, see assessment for likely next steps in optimizing pain regimen - Opiate pain medication as needed for her intractable trigeminal neuralgia pain - PLEX 5 of 5 completed 10/6 - Continue to monitor her vision for any changes - Will need close follow-up with Dr. Vear after hospital discharge. She may also benefit from referral to [facility] for headache and facial pain. - OK to remove PLEX catheter - PT/OT - Neurology will continue  to follow __________________  Elida Ross, MD [facility] [phone]  If 7pm- 7am, please page neurology on call as listed in [facility].

## 2023-12-19 NOTE — Progress Notes (Addendum)
 PROGRESS NOTE    Patricia Patton  FMW:[id number] DOB: 1971-01-13 DOA: 12/06/2023 PCP: Claudene Rayfield HERO, MD  No chief complaint on file.   Brief Narrative:   53 yo with hx R sided trigeminal neuralgia, anxiety, depression, and multiple other medical issues transferred from [facility], now being followed by neurology with concern for transverse myelitis.   Assessment & Plan:   Principal Problem:   Neuromyelitis optica spectrum disorder (HCC) Active Problems:   Anxiety and depression   Abnormal finding on GI tract imaging   Grade II internal hemorrhoids  Acute Transverse Myelitis  - MRI with abnormal increased T2 signal within the spinal cord extending from cervical medullary junction to the mid body of C6, with abnormal enhancement posterolaterally on the R at cervical medullary junction and posterolaterally on the L at C1 and C2 vertebral bodies c/w demyelinating disease - MRI T spine without demyelinating disease.  MRI L spine without spinal canal stenosis or neural foraminal narrowing in lumbar spine.  MRI orbits without evidence of acute or prior optic neuritis.  MRI brain with abnormal cervical findings noted concerning for demyelinating disease. - repeat MRI orbits with ? Focal atrophy of distal L optic nerve (no significant abnormal enhancement to suggest active or acute optic neuritis) - s/p LP with elevated WBC count, lymph predominant.  Protein 31, Glucose 83.  Negative meningitis/encephalitis panel.  Pending oligoclonal bands. - negative autoimmune myelopathy panel - negative neuromyelitis optica ab - negative aqp4 ab and mog ab - negative antiextractable nuclear ag, negative anca, negative ANA - pending anti ENA plus (miscellaneous [facility] test 9/27) - miscellaneous [facility] test 9/26 - appreciate neurology recs - follow CSF myelitis panel, [facility] test code MAC1.  Gabapentin  900 mg TID.  Carbamazepine  decreased to home regimen.  S/p Plex x5.  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  Awaiting further neurology recs.  - ophtho saw and exam benign without evdience of optic neuritis or other pathology requiring urgent intervention - recommending artificial tears - they'll see outpatient as needed  Trigeminal Neuralgia - appreciate neurology assistance - continuing tegretol , gabapentin   Acne S/p flagyl cream  Irregular Bowel Wall thickening concerning for malignancy - s/p colonoscopy which was normal - needs repeat colonoscopy in 10 years for screening purposes  Depression  Anxiety - cymbalta , trazodone   Small Fiber Neuropathy - continue cymbalta , gabapentin     DVT prophylaxis: SCD Code Status: full Family Communication: none Disposition:   Status is: Inpatient Remains inpatient appropriate because: need for continued inpatient care   Consultants:  Neurology GI ID Ophtho   Procedures:  Colonoscopy - The entire examined colon is normal. - Ascending colon was normal- appearing on anterograde and retroflexed views. Made several passes through the right colon and no pathology noted. - Non- bleeding internal hemorrhoids. - The examined portion of the ileum was normal. - No specimens collected. Recommendation: - Return patient to hospital ward for ongoing care. - Advance diet as tolerated. - Continue present medications. - Repeat colonoscopy in 10 years for screening purposes. - Return to GI office PRN. - Inpatient GI service will sign off at this time. Please do not hesitate to contact us  with additional questions or concerns.  Antimicrobials:  Anti-infectives (From admission, onward)    None       Subjective: Sleeping, spoke with husband at bedside Awaiting neurology input   Objective: Vitals:   12/18/23 2348 12/19/23 0352 12/19/23 0811 12/19/23  1241  BP: 94/61 102/62 (!) 94/58 94/66  Pulse: 84 75 92 83  Resp: 14 14 16 17   Temp: 98.4 F (36.9 C) 98.2 F (36.8 C)  98.2 F (36.8 C) 98 F (36.7 C)  TempSrc: Oral Oral Oral Oral  SpO2: 93% 96% 94% 98%  Weight:      Height:        Intake/Output Summary (Last 24 hours) at 12/19/2023 1325 Last data filed at 12/19/2023 1000 Gross per 24 hour  Intake 360 ml  Output --  Net 360 ml    Filed Weights   12/16/23 1312 12/16/23 1816 12/18/23 1554  Weight: 62.2 kg 62.2 kg 61.6 kg    Examination:  General: No acute distress. Lungs: unlabored Neurological: sleeping - did not wake her this AM Extremities: No clubbing or cyanosis. No edema.  Data Reviewed: I have personally reviewed following labs and imaging studies  CBC: Recent Labs  Lab 12/13/23 0545 12/14/23 0944 12/15/23 0154 12/16/23 0925 12/17/23 0500 12/18/23 0610 12/19/23 0640  WBC 8.9 6.9 7.9 6.6 8.4 8.2 7.9  NEUTROABS 5.1 4.4 3.8 4.1 4.8  --   --   HGB 12.3 11.9* 11.9* 10.5* 10.8* 9.9* 9.8*  HCT 36.5 36.4 36.1 32.5* 33.0* 30.3* 29.9*  MCV 89.5 91.5 92.6 93.1 91.9 92.1 92.0  PLT 110* 123* 141* 162 190 218 218    Basic Metabolic Panel: Recent Labs  Lab 12/15/23 0154 12/17/23 0500 12/18/23 0610 12/19/23 0640  NA 140 141 141 142  K 4.4 3.9 3.5 3.6  CL 103 107 104 107  CO2 29 28 27 26   GLUCOSE 127* 95 87 87  BUN 16 8 8 6   CREATININE 0.73 0.59 0.52 0.60  CALCIUM  8.6* 8.8* 8.5* 8.8*  MG 2.3  --   --   --   PHOS 4.4  --   --   --     GFR: Estimated Creatinine Clearance: 71 mL/min (by C-G formula based on SCr of 0.6 mg/dL).  Liver Function Tests: Recent Labs  Lab 12/15/23 0154  AST 29  ALT 47*  ALKPHOS 36*  BILITOT 0.6  PROT 5.3*  ALBUMIN  3.4*    CBG: No results for input(s): GLUCAP in the last 168 hours.    No results found for this or any previous visit (from the past 240 hours).        Radiology Studies: MR BRAIN W WO CONTRAST Result Date: 12/18/2023 CLINICAL DATA:  Demyelinating disease EXAM: MRI HEAD WITHOUT AND WITH CONTRAST TECHNIQUE: Multiplanar, multiecho pulse sequences of the brain and  surrounding structures were obtained without and with intravenous contrast. CONTRAST:  6mL GADAVIST  GADOBUTROL  1 MMOL/ML IV SOLN COMPARISON:  12/12/2023 FINDINGS: MRI brain: Again noted is Emmery Seiler focus of T2 hyperintensity in the right posterolateral aspect of the cervicomedullary junction with enhancement and Syra Sirmons small focus of T2 hyperintensity in the left posterolateral aspect of the cervicomedullary junction with enhancement. There is Derick Seminara subcentimeter focus of T2 hyperintensity in the right subcortical white matter. This does not enhance. There are few punctate foci of T2 hyperintensity elsewhere within the white matter. The periventricular and corpus callosal lesions typical of multiple sclerosis are not present. No other significant signal abnormality in the brain parenchyma. No abnormal enhancement. There is no acute or chronic infarct. The ventricles are normal. No mass lesion. There are normal flow signals in the carotid arteries and basilar artery. No significant bone marrow signal abnormality. No significant abnormality in the paranasal sinuses or soft tissues. IMPRESSION: 1.  Foci of T2 hyperintensity with enhancement in the right and left posterolateral aspect of the cervicomedullary junction. These are better seen on the cervical spine MRI. They most likely represent foci of demyelination. 2. Small nonenhancing subcentimeter focus of T2 hyperintensity in the right subcortical white matter, uncertain etiology. There are few other punctate foci of T2 hyperintensity in the cerebral white matter which do not enhance and are not likely significant. 3. The periventricular and corpus callosal lesions most typical of multiple sclerosis are not present. Electronically Signed   By: Nancyann Burns M.D.   On: 12/18/2023 15:56   MR CERVICAL SPINE W WO CONTRAST Result Date: 12/18/2023 CLINICAL DATA:  Demyelinating disease EXAM: MRI CERVICAL SPINE WITHOUT AND WITH CONTRAST TECHNIQUE: Multiplanar and multiecho pulse sequences  of the cervical spine, to include the craniocervical junction and cervicothoracic junction, were obtained without and with intravenous contrast. CONTRAST:  6mL GADAVIST  GADOBUTROL  1 MMOL/ML IV SOLN COMPARISON:  December 06, 2023 FINDINGS: The degree of T2 hyperintensity within the cord is significantly decreased compared with the prior study. There are persistent areas of abnormal T2 signal and enhancement in the right posterolateral aspect of the cord at the cervicomedullary junction and the left posterolateral aspect of the cord extending from the cervicomedullary junction to the C2-3 level similar to the prior study. Possible cord lesion at the T3-4 level without enhancement. IMPRESSION: The degree of T2 hyperintensity within the cord noted on the prior study has significantly decreased. There are persistent areas of abnormal T2 signal and enhancement in the right posterolateral aspect of the cord at the cervicomedullary junction and the left posterolateral aspect of the cord extending from the cervicomedullary junction to the C2-3 level similar to the prior study. Possible cord lesion at the T3-4 level without enhancement. Electronically Signed   By: Nancyann Burns M.D.   On: 12/18/2023 15:34         Scheduled Meds:  artificial tears  2 drop Both Eyes QID   carbamazepine   100 mg Oral TID   Chlorhexidine  Gluconate Cloth  6 each Topical Daily   DULoxetine   60 mg Oral Daily   gabapentin   900 mg Oral TID   metroNIDAZOLE   Topical BID   polyethylene glycol  17 g Oral BID   senna  1 tablet Oral QHS   sodium chloride  flush  10-40 mL Intracatheter Q12H   traZODone   150 mg Oral QHS   Continuous Infusions:     LOS: 13 days    Time spent: over 30 min     Meliton Monte, MD [facility]   To contact the attending provider between 7A-7P or the covering provider during after hours 7P-7A, please log into the web site [email or web address] and access using universal [place] password for that  web site. If you do not have the password, please call the hospital operator.  12/19/2023, 1:25 PM

## 2023-12-19 NOTE — Progress Notes (Signed)
 Spoke with RN regarding order to discontinue HD catheter. Per RN, pt is pending discharge planning. Will hold on discontinuing HDC until am.

## 2023-12-19 NOTE — Progress Notes (Signed)
 Updated from lab that CSF tube sent to [facility] had blood in specimen and unable to run HSV IgG tests.   Reviewed brain MRI images again and d/w Neurology. This does not seem c/w HSV meningoencephalitis and the PCR is quite sensitive. I would trust that's correct.  I don't think we need to put her through another tap at this time until [facility] autoimmune encephalitis labs return and her condition continues to be unexplained.   Could consider Arbovirus Ab blood vs WNV IgM in CSF (if the lab still has remaining CSF, likely not at this point) - however lower suspicion.   ID will sign off - please call back with any questions/concerns or if we can be of further assistance if her condition continues to go unexplained when [facility] tests return.   D/W Dr. Zoanne  May benefit from direct counsel with [facility] neurology team if resource available?    Corean Fireman, MSN, NP-C [facility]  [email or web address] Pager: [phone] Office: [phone] [facility] Main Line: [phone] *Secure Chat Communication Welcome  Total Encounter Time: no charge

## 2023-12-20 LAB — CBC
HCT: 30.2 % — ABNORMAL LOW (ref 36.0–46.0)
Hemoglobin: 9.9 g/dL — ABNORMAL LOW (ref 12.0–15.0)
MCH: 30.2 pg (ref 26.0–34.0)
MCHC: 32.8 g/dL (ref 30.0–36.0)
MCV: 92.1 fL (ref 80.0–100.0)
Platelets: 254 K/uL (ref 150–400)
RBC: 3.28 MIL/uL — ABNORMAL LOW (ref 3.87–5.11)
RDW: 12 % (ref 11.5–15.5)
WBC: 7.7 K/uL (ref 4.0–10.5)
nRBC: 0 % (ref 0.0–0.2)

## 2023-12-20 LAB — BASIC METABOLIC PANEL WITH GFR
Anion gap: 11 (ref 5–15)
BUN: 7 mg/dL (ref 6–20)
CO2: 27 mmol/L (ref 22–32)
Calcium: 8.4 mg/dL — ABNORMAL LOW (ref 8.9–10.3)
Chloride: 103 mmol/L (ref 98–111)
Creatinine, Ser: 0.67 mg/dL (ref 0.44–1.00)
GFR, Estimated: >60 mL/min (ref >60)
Glucose, Bld: 94 mg/dL (ref 70–99)
Potassium: 3.4 mmol/L — ABNORMAL LOW (ref 3.5–5.1)
Sodium: 141 mmol/L (ref 135–145)

## 2023-12-20 MED ORDER — KETOROLAC TROMETHAMINE 15 MG/ML IJ SOLN
15.0000 mg | Freq: Once | INTRAMUSCULAR | Status: AC
  Administered 2023-12-20: 15 mg via INTRAVENOUS
  Filled 2023-12-20: qty 1

## 2023-12-20 MED ORDER — PROCHLORPERAZINE EDISYLATE 10 MG/2ML IJ SOLN
10.0000 mg | Freq: Once | INTRAMUSCULAR | Status: AC
  Administered 2023-12-20: 10 mg via INTRAVENOUS
  Filled 2023-12-20: qty 2

## 2023-12-20 MED ORDER — DIPHENHYDRAMINE HCL 50 MG/ML IJ SOLN
25.0000 mg | Freq: Once | INTRAMUSCULAR | Status: AC
  Administered 2023-12-20: 25 mg via INTRAVENOUS
  Filled 2023-12-20: qty 1

## 2023-12-20 MED ORDER — DEXAMETHASONE SODIUM PHOSPHATE 10 MG/ML IJ SOLN
10.0000 mg | Freq: Once | INTRAMUSCULAR | Status: AC
  Administered 2023-12-20: 10 mg via INTRAVENOUS
  Filled 2023-12-20: qty 1

## 2023-12-20 NOTE — Evaluation (Addendum)
 Physical Therapy Evaluation Patient Details Name: Patricia Patton Patton MRN: [id number] DOB: 1970/07/16 Today's Date: 12/20/2023  History of Present Illness  53 y.o. female presents to [facility] 12/06/23 with R sided facial pain and B shoulder pain. 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 PMHx: recently diagnosed R sided trigeminal neuralgia, anxiety/depression   Clinical Impression  PTA pt was independent for mobility with no AD, however, would reach for UE support if available. Pt reports hx of multiple episodes of feeling hot/lightheaded when at home and in the community with pt then needing to slowly sit to recover. Reported episodes happen when going from sit to stand, reaching up/down, and at other times. Pt did not have any symptoms during eval with pt being ModI to Ind for all mobility. Pt reaches for UE support if available as a preventative measure in case she starts to have an episode. Discussed using a SP cane or RW for UE support, however, pt declines. Pt will have intermittent assist available upon d/c home. Pt has no further acute or post-acute PT needs with acute PT signing off. Please re-consult if new needs arise.         If plan is discharge home, recommend the following: Assist for transportation   Can travel by private vehicle    Yes    Equipment Recommendations None recommended by PT     Functional Status Assessment Patient has not had a recent decline in their functional status     Precautions / Restrictions Precautions Precautions: Fall Precaution/Restrictions Comments: hx presyncopal symptoms Restrictions Weight Bearing Restrictions Per Provider Order: No      Mobility  Bed Mobility Overal bed mobility: Independent   Transfers Overall transfer level: Independent Equipment used: None     Ambulation/Gait Ambulation/Gait assistance: Modified independent (Device/Increase time) Gait Distance (Feet): 400  Feet Assistive device: None Gait Pattern/deviations: Step-through pattern, Decreased stride length       General Gait Details: steady gait with pt reaching for UE support if available. Pt reported always reaching for surfaces just in case she starts to feel hot/lightheaded  Stairs Stairs: Yes Stairs assistance: Modified independent (Device/Increase time) Stair Management: One rail Right, One rail Left, Forwards Number of Stairs: 6 General stair comments: uses one handrail to ascend and two to descend, ModI with no assist needed    Balance Overall balance assessment: Needs assistance Sitting-balance support: No upper extremity supported, Feet supported Sitting balance-Leahy Scale: Normal     Standing balance support: No upper extremity supported Standing balance-Leahy Scale: Good       Pertinent Vitals/Pain Pain Assessment Pain Assessment: Faces Faces Pain Scale: Hurts little more Pain Location: frontal headache Pain Descriptors / Indicators: Discomfort, Headache Pain Intervention(s): Limited activity within patient's tolerance, Monitored during session, Premedicated before session, Repositioned    Home Living Family/patient expects to be discharged to:: Private residence Living Arrangements: Spouse/significant other;Children Available Help at Discharge: Family;Available PRN/intermittently Type of Home: House Home Access: Level entry     Alternate Level Stairs-Number of Steps: 12 Home Layout: Two level;Bed/bath upstairs Home Equipment: Crutches;Cane - single point      Prior Function Prior Level of Function : Independent/Modified Independent;Driving    Mobility Comments: Ind with no AD, reaches for UE support if available       Extremity/Trunk Assessment   Upper Extremity Assessment Upper Extremity Assessment: Defer to OT evaluation    Lower Extremity Assessment Lower Extremity Assessment: Overall WFL for tasks assessed    Cervical / Trunk  Assessment Cervical / Trunk Assessment: Normal  Communication   Communication Communication: No apparent difficulties    Cognition Arousal: Alert Behavior During Therapy: WFL for tasks assessed/performed   PT - Cognitive impairments: No apparent impairments      PT - Cognition Comments: No impairements noted during session, reports hx of decreased STM Following commands: Intact       Cueing Cueing Techniques: Verbal cues     General Comments General comments (skin integrity, edema, etc.): VSS on RA     PT Assessment Patient does not need any further PT services         PT Goals (Current goals can be found in the Care Plan section)  Acute Rehab PT Goals PT Goal Formulation: All assessment and education complete, DC therapy     AM-PAC PT 6 Clicks Mobility  Outcome Measure Help needed turning from your back to your side while in a flat bed without using bedrails?: None Help needed moving from lying on your back to sitting on the side of a flat bed without using bedrails?: None Help needed moving to and from a bed to a chair (including a wheelchair)?: None Help needed standing up from a chair using your arms (e.g., wheelchair or bedside chair)?: None Help needed to walk in hospital room?: None Help needed climbing 3-5 steps with a railing? : None 6 Click Score: 24    End of Session   Activity Tolerance: Patient tolerated treatment well Patient left: in bed;with call bell/phone within reach Nurse Communication: Mobility status (hx of presyncopal symptoms) PT Visit Diagnosis: Other abnormalities of gait and mobility (R26.89)    Time: 9190-9170 PT Time Calculation (min) (ACUTE ONLY): 20 min   Charges:   PT Evaluation $PT Eval Low Complexity: 1 Low   PT General Charges $$ ACUTE PT VISIT: 1 Visit        Patricia Patton Patton, PT, DPT Secure Chat Preferred  Rehab Office [phone]   Patricia Patton Patton Patricia Patton Patton 12/20/2023, 8:55 AM

## 2023-12-20 NOTE — Plan of Care (Signed)
   Problem: Education: Goal: Knowledge of General Education information will improve Description Including pain rating scale, medication(s)/side effects and non-pharmacologic comfort measures Outcome: Progressing

## 2023-12-20 NOTE — Progress Notes (Signed)
 Progress Note    Patricia Patton   FMW:[id number]  DOB: 1971/01/29  DOA: 12/06/2023     14 PCP: Claudene Rayfield HERO, MD  Initial CC: Headache, facial pain  Hospital Course: 53 yo with hx R sided trigeminal neuralgia, anxiety, depression, and multiple other medical issues transferred from [facility], now being followed by neurology with concern for transverse myelitis.    Assessment & Plan:   Acute Transverse Myelitis  - MRI with abnormal increased T2 signal within the spinal cord extending from cervical medullary junction to the mid body of C6, with abnormal enhancement posterolaterally on the R at cervical medullary junction and posterolaterally on the L at C1 and C2 vertebral bodies c/w demyelinating disease - MRI T spine without demyelinating disease.  MRI L spine without spinal canal stenosis or neural foraminal narrowing in lumbar spine.  MRI orbits without evidence of acute or prior optic neuritis.  MRI brain with abnormal cervical findings noted concerning for demyelinating disease. - repeat MRI orbits with ? Focal atrophy of distal L optic nerve (no significant abnormal enhancement to suggest active or acute optic neuritis) - s/p LP with elevated WBC count, lymph predominant.  Protein 31, Glucose 83.  Negative meningitis/encephalitis panel.  Negative oligoclonal bands - negative autoimmune myelopathy panel - negative neuromyelitis optica ab - negative aqp4 ab and mog ab - negative antiextractable nuclear ag, negative anca, negative ANA - pending anti ENA plus (miscellaneous [facility] test 9/27) - miscellaneous [facility] test 9/26 -Neurology adjusting medications continuously - S/p Plex x 5 days - Tolerating gabapentin  900 mg 3 times daily - Continue baclofen and Tegretol .  Now recommended for holding off on lamotrigine  unless comes off of Tegretol  in the future - Outpatient follow-up with Dr. Vear at [facility] planned and urgent referral to [facility] - Outpatient follow-up with ophthalmology  Trigeminal Neuralgia - appreciate neurology assistance - continuing tegretol , gabapentin    Acne S/p flagyl cream   Irregular Bowel Wall thickening concerning for malignancy - s/p colonoscopy which was normal - needs repeat colonoscopy in 10 years for screening purposes   Depression  Anxiety - cymbalta , trazodone    Small Fiber Neuropathy - continue cymbalta , gabapentin   Interval History:  No events overnight.  Seems to be a little bit more comfortable in general.  Still having some headache and facial pain but overall does endorse improvement since admission.  Old records reviewed in assessment of this patient  Antimicrobials:   DVT prophylaxis:  Place and maintain sequential compression device Start: 12/13/23 2010   Code Status:   Code Status: Full Code  Mobility Assessment (Last 72 Hours)     Mobility Assessment     Row Name 12/20/23 1156 12/20/23 0811 12/20/23 0745 12/20/23 0348 12/20/23 0004   Does the patient have exclusion criteria? -- -- No - Perform mobility assessment No - Perform mobility assessment No - Perform mobility assessment   What is the highest level of mobility based on the mobility assessment? Level 5 (Ambulates independently) - Balance while walking independently - Complete Level 5 (Ambulates independently) - Balance while walking independently - Complete Level 5 (Ambulates independently) - Balance while walking independently - Complete Level 5 (Ambulates independently) - Balance while walking independently - Complete Level 5 (Ambulates independently) - Balance while walking independently - Complete    Row Name 12/19/23 2013 12/19/23 20:10:17 12/19/23 0800 12/19/23 0300 12/18/23 2015   Does the patient have exclusion criteria? No - Perform mobility assessment No - Perform mobility  assessment No - Perform mobility assessment No - Perform mobility assessment No - Perform mobility assessment   What is the  highest level of mobility based on the mobility assessment? Level 5 (Ambulates independently) - Balance while walking independently - Complete Level 5 (Ambulates independently) - Balance while walking independently - Complete Level 5 (Ambulates independently) - Balance while walking independently - Complete Level 5 (Ambulates independently) - Balance while walking independently - Complete Level 5 (Ambulates independently) - Balance while walking independently - Complete    Row Name 12/18/23 0851 12/17/23 1956 12/17/23 1800       Does the patient have exclusion criteria? No - Perform mobility assessment No - Perform mobility assessment --     What is the highest level of mobility based on the mobility assessment? Level 5 (Ambulates independently) - Balance while walking independently - Complete Level 5 (Ambulates independently) - Balance while walking independently - Complete Level 5 (Ambulates independently) - Balance while walking independently - Complete        Barriers to discharge: None Disposition Plan: Home HH orders placed: N/A Status is: Inpatient  Objective: Blood pressure (!) 93/59, pulse 83, temperature 98.4 F (36.9 C), temperature source Oral, resp. rate 18, height 5' 2 (1.575 m), weight 61.6 kg, SpO2 95%.  Examination:  Physical Exam Constitutional:      Appearance: Normal appearance.  HENT:     Head: Normocephalic and atraumatic.     Mouth/Throat:     Mouth: Mucous membranes are moist.  Eyes:     General: No visual field deficit.    Extraocular Movements: Extraocular movements intact.  Cardiovascular:     Rate and Rhythm: Normal rate and regular rhythm.  Pulmonary:     Effort: Pulmonary effort is normal. No respiratory distress.     Breath sounds: Normal breath sounds. No wheezing.  Abdominal:     General: Bowel sounds are normal. There is no distension.     Palpations: Abdomen is soft.     Tenderness: There is no abdominal tenderness.  Musculoskeletal:         General: Normal range of motion.     Cervical back: Normal range of motion and neck supple.  Skin:    General: Skin is warm and dry.  Neurological:     General: No focal deficit present.     Mental Status: She is alert and oriented to person, place, and time.     Motor: Motor function is intact. No weakness.     Comments: Skin hypersensitivity appreciated in right facial distribution fields  Psychiatric:        Mood and Affect: Mood normal.      Consultants:  Neurology  Procedures:    Data Reviewed: Results for orders placed or performed during the hospital encounter of 12/06/23 (from the past 24 hours)  CBC     Status: Abnormal   Collection Time: 12/20/23  6:09 AM  Result Value Ref Range   WBC 7.7 4.0 - 10.5 K/uL   RBC 3.28 (L) 3.87 - 5.11 MIL/uL   Hemoglobin 9.9 (L) 12.0 - 15.0 g/dL   HCT 69.7 (L) 63.9 - 53.9 %   MCV 92.1 80.0 - 100.0 fL   MCH 30.2 26.0 - 34.0 pg   MCHC 32.8 30.0 - 36.0 g/dL   RDW 87.9 88.4 - 84.4 %   Platelets 254 150 - 400 K/uL   nRBC 0.0 0.0 - 0.2 %  Basic metabolic panel with GFR     Status: Abnormal  Collection Time: 12/20/23  6:09 AM  Result Value Ref Range   Sodium 141 135 - 145 mmol/L   Potassium 3.4 (L) 3.5 - 5.1 mmol/L   Chloride 103 98 - 111 mmol/L   CO2 27 22 - 32 mmol/L   Glucose, Bld 94 70 - 99 mg/dL   BUN 7 6 - 20 mg/dL   Creatinine, Ser 9.32 0.44 - 1.00 mg/dL   Calcium  8.4 (L) 8.9 - 10.3 mg/dL   GFR, Estimated >39 >39 mL/min   Anion gap 11 5 - 15    I have reviewed pertinent nursing notes, vitals, labs, and images as necessary. I have ordered labwork to follow up on as indicated.  I have reviewed the last notes from staff over past 24 hours. I have discussed patient's care plan and test results with nursing staff, CM/SW, and other staff as appropriate.  Time spent: Greater than 50% of the 55 minute visit was spent in counseling/coordination of care for the patient as laid out in the A&P.   LOS: 14 days   Alm Apo,  MD [facility] 12/20/2023, 2:50 PM

## 2023-12-20 NOTE — Hospital Course (Signed)
 53 yo with hx R sided trigeminal neuralgia, anxiety, depression, and multiple other medical issues transferred from [facility], now being followed by neurology with concern for transverse myelitis.    Assessment & Plan:   Acute Transverse Myelitis  - MRI with abnormal increased T2 signal within the spinal cord extending from cervical medullary junction to the mid body of C6, with abnormal enhancement posterolaterally on the R at cervical medullary junction and posterolaterally on the L at C1 and C2 vertebral bodies c/w demyelinating disease - MRI T spine without demyelinating disease.  MRI L spine without spinal canal stenosis or neural foraminal narrowing in lumbar spine.  MRI orbits without evidence of acute or prior optic neuritis.  MRI brain with abnormal cervical findings noted concerning for demyelinating disease. - repeat MRI orbits with ? Focal atrophy of distal L optic nerve (no significant abnormal enhancement to suggest active or acute optic neuritis) - s/p LP with elevated WBC count, lymph predominant.  Protein 31, Glucose 83.  Negative meningitis/encephalitis panel.  Negative oligoclonal bands - negative autoimmune myelopathy panel - negative neuromyelitis optica ab - negative aqp4 ab and mog ab - negative antiextractable nuclear ag, negative anca, negative ANA - pending anti ENA plus (miscellaneous [facility] test 9/27) - miscellaneous [facility] test 9/26 -Neurology adjusting medications continuously - S/p Plex x 5 days - Tolerating gabapentin  900 mg 3 times daily; increased to QID at discharge - Continue baclofen and Tegretol .  Now recommended for holding off on lamotrigine  unless comes off of Tegretol  in the future - Outpatient follow-up with Dr. Vear at [facility] planned and urgent referral to [facility] - Outpatient follow-up with ophthalmology  Trigeminal Neuralgia - appreciate neurology assistance - continuing tegretol , gabapentin    Acne S/p flagyl  cream   Irregular Bowel Wall thickening concerning for malignancy - s/p colonoscopy which was normal - needs repeat colonoscopy in 10 years for screening purposes   Depression  Anxiety - cymbalta , trazodone    Small Fiber Neuropathy - continue cymbalta , gabapentin 

## 2023-12-20 NOTE — Progress Notes (Signed)
 NEUROLOGY CONSULT FOLLOW UP NOTE   Date of service: December 20, 2023 Patient Name: Patricia Patton MRN:  [id number] DOB:  September 05, 1970    Review of HPI  53 y.o. female with hx of recently diagnosed right sided trigeminal neuralgia as well as anxiety and depression who presented to the ED with worsening right sided facial pain and bilateral shoulder pain.  She was in her usual state of health until 2 months ago.  She had no pre-existing neurologic conditions other than numbness in her fingertips and her toes which had been previously diagnosed by outside neurologist as small fiber neuropathy.  She began to have severe paroxysmal facial pain in her right face initially around the V1 region and then spreading to V2 and V3.  The paroxysms of pain were crippling and 10 out of 10 in severity.  They would be triggered reliably by touching her face or brushing her hair.  She initially sought care at [facility] and they suspected that she may be developing shingles and that her rash had just not shown yet.  She was treated with Valtrex and increasing doses of gabapentin .  She never did develop a rash.  The trigeminal neuralgia-like pain then spread to the left V1 region.  The right sided pain was still with a paroxysmal component but was becoming near constant and she was unable to function.  Prior to admission she was taking gabapentin  900 mg 3 times daily and carbamazepine  100 mg 3 times daily without significant benefit.  She denied any other neurologic symptoms although she reported pain and mild weakness in her neck and shoulders.  On initial neurological exam at [facility] on  9/23, she had bilateral deltoid weakness and was lethargic secondary to narcotics administered in the ED.  MRI brain was performed which showed no significant intracranial abnormalities. MRI C-spine did show abnormal T2 signal throughout the visualized cervical cord and abnormal enhancement along the right lateral and left posterolateral surfaces  of the upper cervical spinal cord most concerning for a transverse myelitis.   Interval Hx/subjective  Patient completed PLEX #5 last night. Platelet count is 190 this morning.  She has no visual changes and continues to complain of headache in the top of her head on the right side rated 7-8/10 with pain 5/10 on the left side of the face  Vitals   Vitals:   12/19/23 2338 12/20/23 0317 12/20/23 0910 12/20/23 1117  BP: 101/61 98/60 (!) 93/58 (!) 93/59  Pulse: 83 79 90 83  Resp: 18 18 18 18   Temp: 98.7 F (37.1 C) 98.4 F (36.9 C) 97.8 F (36.6 C) 98.4 F (36.9 C)  TempSrc: Oral Oral Oral Oral  SpO2: 94% 96% 96% 95%  Weight:      Height:         Body mass index is 24.84 kg/m.  Physical Exam   Constitutional: Appears well-developed and well-nourished.  Psych: Affect somewhat blunted but appropriate to situation Eyes: No scleral injection.  HENT: No OP obstrucion.  Head: Normocephalic.  Respiratory: Effort normal, non-labored breathing.   Neurologic Examination   Mental Status: Awake, alert and oriented. Speech clear and fluent  Cranial Nerves: II: PERRL. Fixates and tracks normally.    III,IV, VI: No ptosis. EOMI. No nystagmus.  V: Pins and needle sensation noted with touch on the right side of the face, sensation more normal on the left VII: Subtle right facial weakness, lower quadrant (patient states this is chronic due to prior Bell's palsy about 10  years ago) VIII: Hearing intact to conversation IX,X: No hoarseness or hypophonia XI: Symmetric XII: Midline tongue extension Motor: Right :  Upper extremity   5/5                                      Left:     Upper extremity   5/5             Lower extremity   5/5                                                  Lower extremity   5/5 No pronator drift. Muscle tone and bulk is normal x 4.  Sensory: Sensation intact to light touch and symmetrical in bilateral extremities Cerebellar: No ataxia with FNF bilaterally. No  tremor noted.  Gait: Deferred  Medications  Current Facility-Administered Medications:    [COMPLETED] acetaminophen  (TYLENOL ) tablet 1,000 mg, 1,000 mg, Oral, Q8H, 1,000 mg at 12/19/23 0635 **FOLLOWED BY** acetaminophen  (TYLENOL ) tablet 650 mg, 650 mg, Oral, Q6H PRN, Perri DELENA Meliton Mickey., MD   artificial tears ophthalmic solution 2 drop, 2 drop, Both Eyes, QID, Perri DELENA Meliton Mickey., MD, 2 drop at 12/20/23 0950   baclofen (LIORESAL) tablet 5 mg, 5 mg, Oral, TID, Matthews Elida HERO, MD, 5 mg at 12/20/23 9050   carbamazepine  (TEGRETOL ) chewable tablet 100 mg, 100 mg, Oral, TID, Samtani, Jai-Gurmukh, MD, 100 mg at 12/20/23 9050   Chlorhexidine  Gluconate Cloth 2 % PADS 6 each, 6 each, Topical, Daily, Samtani, Jai-Gurmukh, MD, 6 each at 12/19/23 2056   dexamethasone (DECADRON) injection 10 mg, 10 mg, Intravenous, Once, Matthews Elida HERO, MD   diphenhydrAMINE  (BENADRYL ) capsule 25 mg, 25 mg, Oral, Q6H PRN, Perri DELENA Meliton Mickey., MD, 25 mg at 12/20/23 0608   diphenhydrAMINE  (BENADRYL ) injection 25 mg, 25 mg, Intravenous, Once, Matthews Elida HERO, MD   DULoxetine  (CYMBALTA ) DR capsule 60 mg, 60 mg, Oral, Daily, Samtani, Jai-Gurmukh, MD, 60 mg at 12/20/23 9050   gabapentin  (NEURONTIN ) capsule 900 mg, 900 mg, Oral, TID, Samtani, Jai-Gurmukh, MD, 900 mg at 12/20/23 0949   ketorolac  (TORADOL ) 15 MG/ML injection 15 mg, 15 mg, Intravenous, Once, Matthews Elida HERO, MD   ondansetron  (ZOFRAN ) tablet 4 mg, 4 mg, Oral, Q8H PRN, Matthews Elida HERO, MD   Oral care mouth rinse, 15 mL, Mouth Rinse, PRN, Perri DELENA Meliton Mickey., MD   oxyCODONE  (Oxy IR/ROXICODONE ) immediate release tablet 5 mg, 5 mg, Oral, Q4H PRN **OR** oxyCODONE  (Oxy IR/ROXICODONE ) immediate release tablet 10 mg, 10 mg, Oral, Q4H PRN, Perri DELENA Meliton Mickey., MD, 10 mg at 12/20/23 1000   polyethylene glycol (MIRALAX / GLYCOLAX) packet 17 g, 17 g, Oral, BID, Samtani, Jai-Gurmukh, MD, 17 g at 12/16/23 9171   prochlorperazine  (COMPAZINE ) injection 10 mg, 10  mg, Intravenous, Once, Matthews Elida HERO, MD   senna (SENOKOT) tablet 8.6 mg, 1 tablet, Oral, QHS, Samtani, Jai-Gurmukh, MD, 8.6 mg at 12/18/23 2129   sodium chloride  flush (NS) 0.9 % injection 10-40 mL, 10-40 mL, Intracatheter, Q12H, Perri DELENA Meliton Mickey., MD, 10 mL at 12/19/23 2057   sodium chloride  flush (NS) 0.9 % injection 10-40 mL, 10-40 mL, Intracatheter, PRN, Perri DELENA Meliton Mickey., MD, 10 mL at 12/18/23 2137   traZODone  (DESYREL ) tablet 150 mg,  150 mg, Oral, QHS, Samtani, Jai-Gurmukh, MD, 150 mg at 12/19/23 2055  Labs and Diagnostic Imaging   CBC:  Recent Labs  Lab 12/16/23 0925 12/17/23 0500 12/18/23 0610 12/19/23 0640 12/20/23 0609  WBC 6.6 8.4   < > 7.9 7.7  NEUTROABS 4.1 4.8  --   --   --   HGB 10.5* 10.8*   < > 9.8* 9.9*  HCT 32.5* 33.0*   < > 29.9* 30.2*  MCV 93.1 91.9   < > 92.0 92.1  PLT 162 190   < > 218 254   < > = values in this interval not displayed.    Basic Metabolic Panel:  Lab Results  Component Value Date   NA 141 12/20/2023   K 3.4 (L) 12/20/2023   CO2 27 12/20/2023   GLUCOSE 94 12/20/2023   BUN 7 12/20/2023   CREATININE 0.67 12/20/2023   CALCIUM  8.4 (L) 12/20/2023   GFRNONAA >60 12/20/2023   GFRAA >60 09/30/2018    INR  Lab Results  Component Value Date   INR 1.3 (H) 12/10/2023   APTT  Lab Results  Component Value Date   APTT 26 12/07/2023   Serum Autoimmune Myelopathy panel has resulted and is pan-negative: Anti-Hu Ab                     Negative                  BN     Reference Range: Negative                              This test was developed and its performance characteristics  determined by [facility]. It has not been cleared or approved  by the Food and Drug Administration.  Anti-Ri Ab                     Negative                  BN     Reference Range: Negative                              This test was developed and its performance characteristics  determined by [facility]. It has not been cleared or approved  by the Food  and Drug Administration.  Antineuronal nuclear Ab Type 3 Negative                  BN     Reference Range: Negative                              This test was developed and its performance characteristics  determined by [facility]. It has not been cleared or approved  by the Food and Drug Administration.  PCA Type-1 (Anti-Yo) Ab        Negative                  BN     Reference Range: Negative                              This test was developed and its performance characteristics  determined by [facility]. It has not been cleared or approved  by the Food and Drug Administration.  Purkinje Cell Cyto Ab Type 2   Negative                  BN     Reference Range: Negative                              This test was developed and its performance characteristics  determined by [facility]. It has not been cleared or approved  by the Food and Drug Administration.  Purkinje Cell Cyto Ab Type Tr  Negative                  BN     Reference Range: Negative                              This test was developed and its performance characteristics  determined by [facility]. It has not been cleared or approved  by the Food and Drug Administration.  Amphiphysin Antibody           Negative                  BN     Reference Range: Negative                              This test was developed and its performance characteristics  determined by [facility]. It has not been cleared or approved  by the Food and Drug Administration.  CRMP-5 IgG                     Negative                  BN     Reference Range: Negative                              This test was developed and its performance characteristics  determined by [facility]. It has not been cleared or approved  by the Food and Drug Administration.  CRMP-5 IgG Line Blot           Negative                  BN     Reference Range: Negative                              This test was developed and its performance characteristics  determined by [facility]. It has not been  cleared or approved  by the Food and Drug Administration.  AGNA-1                         Negative                  BN     Reference Range: Negative                              This test was developed and its performance characteristics  determined by [facility]. It has not been cleared or approved  by the Food and Drug Administration.  DPPX Antibody  Negative                  BN     Reference Range: Negative                              This test was developed and its performance characteristics  determined by [facility]. It has not been cleared or approved  by the Food and Drug Administration.  mGluR1 Antibody                Negative                  BN     Reference Range: Negative                              This test was developed and its performance characteristics  determined by [facility]. It has not been cleared or approved  by the Food and Drug Administration.  AMPA-R1 Antibody               Negative                  BN     Reference Range: Negative                              This test was developed and its performance characteristics  determined by [facility]. It has not been cleared or approved  by the Food and Drug Administration.  AMPA-R2 Antibody               Negative                  BN     Reference Range: Negative                              This test was developed and its performance characteristics  determined by [facility]. It has not been cleared or approved  by the Food and Drug Administration.  GABA-B-R Antibody              Negative                  BN     Reference Range: Negative                              This test was developed and its performance characteristics  determined by [facility]. It has not been cleared or approved  by the Food and Drug Administration.  NMDA-R Antibody                Negative                  BN     Reference Range: Negative                              This test was developed and its performance characteristics   determined by [facility]. It has not been cleared or approved  by the Food and Drug Administration.  GAD65 Antibody                 Negative  BN     Reference Range: Negative                              This test was developed and its performance characteristics  determined by [facility]. It has not been cleared or approved  by the Food and Drug Administration.  Aquaporin 4 Antibody           Negative                  BN     Reference Range: Negative                        CSF meningitis/encephalitis panel: Negative for all pathogens tested, including HSV1-2, VZV and HHV6   Assessment  Patricia Patton is a 53 y.o. 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 Her symptoms began with severe trigeminal neuralgia pain, which has been unremitting and resistant to medications.  - Exam today is as documented above. No limb weakness.  - Symptoms today: Headache has improved and is 7-8/10 in the top of her head and bilateral sides of the face.  We are hopeful that headache will continue to improve with more PLEX treatments. - Neuroimaging: - MRI orbits w/wo contrast: No evidence of acute or prior optic neuritis  - MRI brain w/wo contrast: No acute infarct. No acute intracranial hemorrhage. No mass effect or midline shift. No hydrocephalus. The sella is unremarkable. Normal flow voids. There is mild subcortical white matter disease present bilaterally - MRI C-spine w/wo contrast:  Abnormal T2 signal throughout the visualized cervical cord and abnormal enhancement along the right lateral and left posterolateral surfaces of the upper cervical spinal cord.  The findings are concerning for demyelinating disease, but neoplasm cannot be excluded.  - MRI L-spine w/wo contrast: No spinal canal stenosis or neural foraminal narrowing in the lumbar spine.  - MRI T-spine w/wo contrast: No evidence of demyelinating disease involving the thoracic spinal cord.  - Repeat MRI orbits w/wo contrast (9/30): Question focal atrophy involving the distal left optic nerve just posterior to the left globe. No significant abnormal enhancement to suggest active or acute optic neuritis at this time. Otherwise unremarkable MRI of the orbits. - EKG: Sinus tachycardia; Otherwise normal ECG - Labs: - CSF meningitis/encephalitis panel: Negative for all pathogens tested, including HSV1-2, VZV and HHV6 - CSF with elevated WBC of 17, predominantly lymphocytes. Elevated IgG index of 1.2. Total protein normal at 31.  - Serum Autoimmune Myelopathy panel has resulted and is pan-negative: - C-ANCA normal. P-ANCA normal. Atypical P-ANCA titer normal.  - ENA panel: - ENA SM Ab Ser-aCnc: Normal - Ribonucleic protein: Normal - Differential diagnosis: - Although she has negative anti-MOG antibodies, her MRI findings are typical for this entity and MOGAD (myelin-oligodendrocyte glycoprotein antibody-associated disease) can present with acute trigeminal neuralgia as well, based on multiple case reports documented in the literature. Although anti-MOG Ab titers do have high sensitivity of about 90%, MOGAD is still relatively high on the differential despite her negative titer due to the clinical and imaging characteristics.  - Anti-aqp4 antibodies were also negative, which makes neuromyelitis optica much  less likely given that she has no brain lesions and no symptoms or signs of optic neuritis.   - At this point, even if this is not MOGAD, out suspicion that this is some type of autoimmune process is very high and for this reason she has been started on plasmapheresis. - Her LP shows mild  inflammation as would be expected with an inflammatory myelitis.  - ID saw the patient on 10/1 and feel that it is unlikely that the etiology of all of her neuro findings are secondary to HSV. They have called [facility] and have added on HSV1 and 2 IgG to her CSF fluid that had already been sent out. They do not recommend starting anti virals at this time.  - CT chest abdomen pelvis looking for possible paraneoplastic association, in conjunction with colonoscopy, are negative for a mass lesion (possible apple-core lesion on CT was refuted by colonoscopy).   - Platelets initially were normal, then declined with patient thrombocytopenic. Last normal platelet count was 203 on 9/28, subsequently 148 (9/29) >> 121 (9/30) >> 110 >> 123 >> 141 >> 162 >> 190(10/5). Now trending upwards after lowering of her CBZ dose, but may also be due to holding her PLEX treatments. DDx includes secondary to carbamazepine  versus a rare complication of PLEX.  - Complained of recurrence of blurred vision on Tuesday. DDx includes due to NMO versus MOGAD versus secondary to headache pain. Her blurred vision was worse with either eye closed, better with binocular vision. Dr. Corbin of Ophthalmology ordered MRI orbits although received steroids so not really sure what else to do.  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 HSV can also be associated with transverse myelitis (herpes zoster myelitis), but CSF HSV PCR was negative.  - Severe trigeminal neuralgia of recent onset. Possible etiologies include autoimmune, versus secondary to HSV (although CSF HSV PCR was negative).   As of 10/7, patient has completed 5 of 5 sessions of PLEX. TN pain is slightly improved but still severe and she needs better pain control before discharge. Repeat MRI brain and c spine showed improvement in abnormal T2/FLAIR signal from the cervicomedullary junction to T6 with some  residual enhancement (residual enhancement may persist for weeks with or without treatment). I reviewed the actual images from the 9/24 and 10/6 scans with patient and her husband at bedside. Her carbamazepine  was reduced from 200 tid to 100 tid 2/2 thrombocytopenia and poor efficacy and her gabapentin  and cymbalta  are unchanged from home doses. I told her that unfortunately I do not expect to be able to get her completely pain-free prior to discharge but we do need a better regimen in terms of her near-term quality of life and also to reduce unnecessary use of opiates.  I also explained that we cannot make too many medication changes that once otherwise we will not know what is effective or what is causing side effects.    On 10/8 she started on baclofen 5mg  tid and tolerated it well without side effects. This can be uptitrated to 10mg  tid in 3-5 days.   On 10/9 we increased her gabapentin  to 900mg  qid. This is max dose and cannot be uptitrated further. She is tolerating 900mg  tid well without excess lethargy and states it also helps with her anxiety.   I have decided against adding lamotrigine  bc we would likely need to uptitrate to a high total daily dose which could increase the arrhythmogenic  effectives of lamotrigine . If she goes off carbamazepine  in the future lamotrigine  should be reconsidered as an adjunct (not primary) therapy.   After discharge botox injections may be considered although efficacy in TN was only demonstrated in a few small trials.  In terms of rescue medications after discharge intranasal lidocaine  spray is one of the most effective options.  This can only be compounded at a specialty pharmacy and this is not something that I can prescribe for her at discharge, it would have to be prescribed in clinic and sent to specialty compounding pharmacy and may require prior auth.  After discharge she will need close follow-up with Dr. Vear at [facility] and also an urgent referral to [facility]  headache and facial pain specialty clinic.  Recommendations  - Follow-up CSF myelitis panel, Mayo test code MAC1 - SSA, SSBA titers pending  - Continue baclofen 5mg  tid, if she tolerates it well can increase to 10mg  tid in 3-5 days - Increase gabapentin  to 900 mg qid - Continue carbamazepine  100mg  tid - Opiate pain medication as needed (sparingly) for her severe trigeminal neuralgia pain - Her pain today is primarily severe postorbital headache so will give headache cocktail toradol /benadryl /compazine  + decadron. If she still has headache at 5pm plan to give 1g VPA. She has not been sexually active since her last negative pregnancy test 08/29/23 - PLEX 5 of 5 completed 10/6 - Continue to monitor her vision for any changes - Will need close follow-up with Dr. Vear after hospital discharge. She will also benefit from urgent referral to [facility] specialty clinic for headache and facial pain. - PT/OT - Neurology will continue to follow __________________  Elida Ross, MD [facility] [phone]  If 7pm- 7am, please page neurology on call as listed in [facility].

## 2023-12-20 NOTE — Evaluation (Signed)
 Occupational Therapy Evaluation Patient Details Name: Patricia Patton MRN: [id number] DOB: 1970/09/07 Today's Date: 12/20/2023   History of Present Illness   53 y.o. female presents to [facility] 12/06/23 with R sided facial pain and B shoulder pain. 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 PMHx: recently diagnosed R sided trigeminal neuralgia, anxiety/depression     Clinical Impressions Pt received in supine, agreeable for OT visit. AOX4. PTA, pt endorses being independent with ADLs, IADLs, + driving. Has support from her family upon d/c. Functionally, she is with intact strength x4 extremities and is near her functional baseline regarding ADL performance. Scored 0/28 on Short Blessed Test, indicating no cognitive impairment, although pt endorses difficulty with STM recall and observed odd reasoning/organization strategies during session today. Encouraged pt utilize lists and alarm/timer feature on cell phone to aide in memory and executive functioning deficits. Pt receptive to this.   Pt is functioning near her baseline and does not warrant any further acute nor post-acute OT needs. Will sign-off.      If plan is discharge home, recommend the following:   Assistance with cooking/housework;Direct supervision/assist for medications management     Functional Status Assessment         Equipment Recommendations   None recommended by OT     Recommendations for Other Services         Precautions/Restrictions   Precautions Precautions: Fall Precaution/Restrictions Comments: hx presyncopal symptoms per pt report Restrictions Weight Bearing Restrictions Per Provider Order: No     Mobility Bed Mobility Overal bed mobility: Independent                  Transfers Overall transfer level: Independent Equipment used: None                      Balance Overall balance assessment: Mild deficits observed, not  formally tested                                         ADL either performed or assessed with clinical judgement   ADL Overall ADL's : Modified independent                                       General ADL Comments: completed UB/LB ADLs without difficulty     Vision Baseline Vision/History: 1 Wears glasses Ability to See in Adequate Light: 0 Adequate Patient Visual Report: No change from baseline       Perception         Praxis         Pertinent Vitals/Pain Pain Assessment Pain Assessment: 0-10 Pain Score: 6  Pain Location: frontal HA Pain Descriptors / Indicators: Aching, Discomfort Pain Intervention(s): Limited activity within patient's tolerance, Monitored during session, Repositioned     Extremity/Trunk Assessment Upper Extremity Assessment Upper Extremity Assessment: Overall WFL for tasks assessed   Lower Extremity Assessment Lower Extremity Assessment: Defer to PT evaluation   Cervical / Trunk Assessment Cervical / Trunk Assessment: Normal   Communication Communication Communication: No apparent difficulties   Cognition Arousal: Alert Behavior During Therapy: WFL for tasks assessed/performed Cognition: Cognition impaired     Awareness: Online awareness impaired Memory impairment (select all impairments): Short-term memory (pt endorsing difficulty with SMT recall in the past) Attention impairment (select  first level of impairment): Selective attention Executive functioning impairment (select all impairments): Organization, Reasoning OT - Cognition Comments: tangential and speaks colloquially                 Following commands: Intact       Cueing  General Comments   Cueing Techniques: Verbal cues  VSS on RA   Exercises     Shoulder Instructions      Home Living Family/patient expects to be discharged to:: Private residence Living Arrangements: Spouse/significant other;Children Available Help at  Discharge: Family;Available PRN/intermittently Type of Home: House Home Access: Level entry     Home Layout: Two level;Bed/bath upstairs Alternate Level Stairs-Number of Steps: 12 Alternate Level Stairs-Rails: Left (can hold onto top of spindles on R side) Bathroom Shower/Tub: Producer, television/film/video: Standard     Home Equipment: Crutches;Cane - single point          Prior Functioning/Environment Prior Level of Function : Independent/Modified Independent;Driving             Mobility Comments: Ind with no AD, reaches for UE support if available ADLs Comments: indep    OT Problem List: Impaired balance (sitting and/or standing);Decreased cognition   OT Treatment/Interventions:        OT Goals(Current goals can be found in the care plan section)   Acute Rehab OT Goals Patient Stated Goal: to go home OT Goal Formulation: With patient   OT Frequency:       Co-evaluation              AM-PAC OT 6 Clicks Daily Activity     Outcome Measure Help from another person eating meals?: None Help from another person taking care of personal grooming?: None Help from another person toileting, which includes using toliet, bedpan, or urinal?: None Help from another person bathing (including washing, rinsing, drying)?: None Help from another person to put on and taking off regular upper body clothing?: None Help from another person to put on and taking off regular lower body clothing?: None 6 Click Score: 24   End of Session    Activity Tolerance: Patient tolerated treatment well Patient left: in bed;with call bell/phone within reach  OT Visit Diagnosis: Unsteadiness on feet (R26.81)                Time: 9069-9049 OT Time Calculation (min): 20 min Charges:  OT General Charges $OT Visit: 1 Visit OT Evaluation $OT Eval Low Complexity: 1 Low  Rikki BIRCH., MSOT, OTR/L Acute Rehabilitation Services [phone] Secure Chat Preferred   Rikki Milch 12/20/2023, 11:59 AM

## 2023-12-21 MED ORDER — BACLOFEN 10 MG PO TABS: 5.0000 mg | ORAL_TABLET | Freq: Three times a day (TID) | ORAL | 1 refills | Status: DC

## 2023-12-21 MED ORDER — BACLOFEN 10 MG PO TABS
5.0000 mg | ORAL_TABLET | Freq: Three times a day (TID) | ORAL | Status: DC
  Administered 2023-12-21: 5 mg via ORAL
  Filled 2023-12-21: qty 1

## 2023-12-21 MED ORDER — OXYCODONE HCL 10 MG PO TABS: 10.0000 mg | ORAL_TABLET | ORAL | 0 refills | Status: DC | PRN

## 2023-12-21 MED ORDER — ONDANSETRON HCL 4 MG PO TABS: 4.0000 mg | ORAL_TABLET | Freq: Three times a day (TID) | ORAL | 0 refills | Status: AC | PRN

## 2023-12-21 MED ORDER — GABAPENTIN 300 MG PO CAPS: 900.0000 mg | ORAL_CAPSULE | Freq: Four times a day (QID) | ORAL | 3 refills | Status: DC

## 2023-12-21 MED ORDER — ACETAMINOPHEN 325 MG PO TABS: 650.0000 mg | ORAL_TABLET | ORAL | Status: AC | PRN

## 2023-12-21 MED ORDER — BACLOFEN 10 MG PO TABS: 10.0000 mg | ORAL_TABLET | Freq: Three times a day (TID) | ORAL | Status: DC

## 2023-12-21 NOTE — Plan of Care (Signed)
  Problem: Education: Goal: Knowledge of General Education information will improve Description: Including pain rating scale, medication(s)/side effects and non-pharmacologic comfort measures 12/21/2023 1131 by Lynnette Cena CROME, RN Outcome: Progressing 12/21/2023 1127 by Lynnette Cena CROME, RN Outcome: Progressing

## 2023-12-21 NOTE — Discharge Instructions (Signed)
 You are on the following medications for trigeminal neuralgia and head pain:   1. Carbamazepine  100mg  three times a day 2. Gabapentin  900mg  4 times a day 3. Duloxetine  DR 60mg  daily 4. Baclofen 5mg  three times a day. If you are still having pain from trigeminal neuralgia you may increase baclofen to 10mg  three times a day. If you are not still having severe pain, break the 10mg  tablets in half and continue taking 5mg  three times a day (half a pill)   These medications can make you sleepy or feel foggy or off balance. If this happens decrease gabapentin  to 900mg  three times a day and talk to your PCP. If you develop a rash or blisters on your skin or your mucosa (mouth, vulva) seek care immediately at urgent care or the ER.   Please talk to your doctor before stopping baclofen. Unless you have significant side effects or overdose emergency, stopping baclofen cold-turkey can cause withdrawal symptoms. Call your doctor to discuss how to taper off gradually (usually 1-2 weeks) to avoid withdrawal.   Another interaction to be aware of is that carbamazepine  and trazodone  can interact to change each others metabolism. Carbmazepine can decrease the amount of trazodone  in your blood. Trazaodone can increase the amount of carbamazepine  in your blood. For that reason I would avoid increasing your carbamazepine  to anything more than your current dose of 100mg  three times a day.    If you take over the counter medications such as tylenol  or ibuprofen , use these sparingly (no more than 2x/wk) to avoid rebound headaches.   We have made outpatient referrals to: - Dr. Vear, [facility] in [place] [phone]) - Dr. Lupita Chancy at [facility] [phone])   The referrals were marked as urgent for you to be seen within 2 weeks. The clinics will call you to make an appointment. If you do not hear from them by early next week you may call the phone numbers  above to make an appointment.   We will also refer you to ophthalmology.

## 2023-12-21 NOTE — Progress Notes (Signed)
 NEUROLOGY CONSULT FOLLOW UP NOTE   Date of service: December 21, 2023 Patient Name: Patricia Patton MRN:  [id number] DOB:  February 25, 1971    HPI  53 y.o. female with hx of recently diagnosed right sided trigeminal neuralgia as well as anxiety and depression who presented to the ED with worsening right sided facial pain and bilateral shoulder pain.  She was in her usual state of health until 2 months ago.  She had no pre-existing neurologic conditions other than numbness in her fingertips and her toes which had been previously diagnosed by outside neurologist as small fiber neuropathy.  She began to have severe paroxysmal facial pain in her right face initially around the V1 region and then spreading to V2 and V3.  The paroxysms of pain were crippling and 10 out of 10 in severity.  They would be triggered reliably by touching her face or brushing her hair.  She initially sought care at [facility] and they suspected that she may be developing shingles and that her rash had just not shown yet.  She was treated with Valtrex and increasing doses of gabapentin .  She never did develop a rash.  The trigeminal neuralgia-like pain then spread to the left V1 region.  The right sided pain was still with a paroxysmal component but was becoming near constant and she was unable to function.  Prior to admission she was taking gabapentin  900 mg 3 times daily and carbamazepine  100 mg 3 times daily without significant benefit.  She denied any other neurologic symptoms although she reported pain. She did not have any objective weakness on exam. MRI brain wwowas performed which showed no significant intracranial abnormalities. MRI C-spine did show abnormal T2 signal throughout the visualized cervical cord and abnormal enhancement along the right lateral and left posterolateral surfaces of the upper cervical spinal cord most concerning for a transverse myelitis (cervicomedullary junction to C6). MRI t spine wwo was  unremarkable.  Interval Hx/subjective   Her trigeminal neuralgia paroxysms are fewer and farther between than prior to her PLEX treatments. She also has a component of chronic daily headache, moderately severe, that began after her TN started in August. I gave her a headache cocktail toradol /compazine /benadryl  + decadron yesterday which helped. Today she describes her head pain as 3-4/10. She is tolerating baclofen well at 5mg  tid and has not noticed any additional lethargy or other adverse effects since increasing the gabapentin  to 900mg  qid. She wishes to be discharged today.  Vitals   Vitals:   12/20/23 1656 12/20/23 2006 12/21/23 0827 12/21/23 1123  BP: 104/71 109/68 104/65 (!) 101/54  Pulse: 73 100 83 88  Resp: 19 19 19 16   Temp: (!) 97.3 F (36.3 C) 97.7 F (36.5 C) 98.2 F (36.8 C) 98.1 F (36.7 C)  TempSrc: Oral Oral Oral Oral  SpO2: 95% 92% 96% 96%  Weight:      Height:         Body mass index is 24.84 kg/m.  Physical Exam   Constitutional: Appears well-developed and well-nourished.  Psych: Affect somewhat blunted but appropriate to situation Eyes: No scleral injection.  HENT: No OP obstrucion.  Head: Normocephalic.  Respiratory: Effort normal, non-labored breathing.   Neurologic Examination   Mental Status: Awake, alert and oriented. Speech clear and fluent  Cranial Nerves: II: PERRL. Fixates and tracks normally.    III,IV, VI: No ptosis. EOMI. No nystagmus.  V: Pins and needle sensation noted with touch on the right side of the face, sensation more  normal on the left VII: Subtle right facial weakness, lower quadrant (patient states this is chronic due to prior Bell's palsy about 10 years ago) VIII: Hearing intact to conversation IX,X: No hoarseness or hypophonia XI: Symmetric XII: Midline tongue extension Motor: Right :  Upper extremity   5/5                                      Left:     Upper extremity   5/5             Lower extremity   5/5                                                   Lower extremity   5/5 No pronator drift. Muscle tone and bulk is normal x 4.  Sensory: Sensation intact to light touch and symmetrical in bilateral extremities Cerebellar: No ataxia with FNF bilaterally. No tremor noted.  Gait: Deferred  Medications  Current Facility-Administered Medications:    [COMPLETED] acetaminophen  (TYLENOL ) tablet 1,000 mg, 1,000 mg, Oral, Q8H, 1,000 mg at 12/19/23 0635 **FOLLOWED BY** acetaminophen  (TYLENOL ) tablet 650 mg, 650 mg, Oral, Q6H PRN, Perri DELENA Meliton Mickey., MD   artificial tears ophthalmic solution 2 drop, 2 drop, Both Eyes, QID, Perri DELENA Meliton Mickey., MD, 2 drop at 12/21/23 9074   baclofen (LIORESAL) tablet 5 mg, 5 mg, Oral, TID, 5 mg at 12/21/23 0927 **FOLLOWED BY** [START ON 12/22/2023] baclofen (LIORESAL) tablet 10 mg, 10 mg, Oral, TID, Matthews Elida HERO, MD   carbamazepine  (TEGRETOL ) chewable tablet 100 mg, 100 mg, Oral, TID, Samtani, Jai-Gurmukh, MD, 100 mg at 12/21/23 9075   Chlorhexidine  Gluconate Cloth 2 % PADS 6 each, 6 each, Topical, Daily, Samtani, Jai-Gurmukh, MD, 6 each at 12/20/23 2123   diphenhydrAMINE  (BENADRYL ) capsule 25 mg, 25 mg, Oral, Q6H PRN, Perri DELENA Meliton Mickey., MD, 25 mg at 12/20/23 9391   DULoxetine  (CYMBALTA ) DR capsule 60 mg, 60 mg, Oral, Daily, Samtani, Jai-Gurmukh, MD, 60 mg at 12/21/23 9074   gabapentin  (NEURONTIN ) capsule 900 mg, 900 mg, Oral, TID, Samtani, Jai-Gurmukh, MD, 900 mg at 12/21/23 9074   ondansetron  (ZOFRAN ) tablet 4 mg, 4 mg, Oral, Q8H PRN, Matthews Elida HERO, MD   Oral care mouth rinse, 15 mL, Mouth Rinse, PRN, Perri DELENA Meliton Mickey., MD   oxyCODONE  (Oxy IR/ROXICODONE ) immediate release tablet 5 mg, 5 mg, Oral, Q4H PRN **OR** oxyCODONE  (Oxy IR/ROXICODONE ) immediate release tablet 10 mg, 10 mg, Oral, Q4H PRN, Perri DELENA Meliton Mickey., MD, 10 mg at 12/20/23 1000   polyethylene glycol (MIRALAX / GLYCOLAX) packet 17 g, 17 g, Oral, BID, Samtani, Jai-Gurmukh, MD, 17 g at 12/20/23  2122   senna (SENOKOT) tablet 8.6 mg, 1 tablet, Oral, QHS, Samtani, Jai-Gurmukh, MD, 8.6 mg at 12/20/23 2122   sodium chloride  flush (NS) 0.9 % injection 10-40 mL, 10-40 mL, Intracatheter, Q12H, Perri DELENA Meliton Mickey., MD, 10 mL at 12/21/23 0925   sodium chloride  flush (NS) 0.9 % injection 10-40 mL, 10-40 mL, Intracatheter, PRN, Perri DELENA Meliton Mickey., MD, 10 mL at 12/18/23 2137   traZODone  (DESYREL ) tablet 150 mg, 150 mg, Oral, QHS, Samtani, Jai-Gurmukh, MD, 150 mg at 12/20/23 2121  Current Outpatient Medications:    baclofen (LIORESAL) 10 MG tablet, Take  0.5-1 tablets (5-10 mg total) by mouth 3 (three) times daily. If you are still having pain from trigeminal neuralgia you may increase baclofen to 10mg  three times a day. If you are not still having severe pain, break the 10mg  tablets in half and continue taking 5mg  three times a day (half a pill), Disp: 30 tablet, Rfl: 1   carbamazepine  (TEGRETOL ) 100 MG chewable tablet, Chew 100 mg by mouth 3 (three) times daily., Disp: , Rfl:    DULoxetine  (CYMBALTA ) 60 MG capsule, Take 60 mg by mouth daily., Disp: , Rfl:    traZODone  (DESYREL ) 150 MG tablet, Take 150 mg by mouth at bedtime., Disp: , Rfl:    acetaminophen  (TYLENOL ) 325 MG tablet, Take 2 tablets (650 mg total) by mouth every 4 (four) hours as needed for mild pain (pain score 1-3), fever or headache., Disp: , Rfl:    gabapentin  (NEURONTIN ) 300 MG capsule, Take 3 capsules (900 mg total) by mouth 4 (four) times daily., Disp: 360 capsule, Rfl: 3   ondansetron  (ZOFRAN ) 4 MG tablet, Take 1 tablet (4 mg total) by mouth every 8 (eight) hours as needed for nausea or vomiting., Disp: 20 tablet, Rfl: 0   oxyCODONE  10 MG TABS, Take 1 tablet (10 mg total) by mouth every 4 (four) hours as needed for severe pain (pain score 7-10) or moderate pain (pain score 4-6)., Disp: 30 tablet, Rfl: 0  Labs and Diagnostic Imaging   CBC:  Recent Labs  Lab 12/16/23 0925 12/17/23 0500 12/18/23 0610 12/19/23 0640  12/20/23 0609  WBC 6.6 8.4   < > 7.9 7.7  NEUTROABS 4.1 4.8  --   --   --   HGB 10.5* 10.8*   < > 9.8* 9.9*  HCT 32.5* 33.0*   < > 29.9* 30.2*  MCV 93.1 91.9   < > 92.0 92.1  PLT 162 190   < > 218 254   < > = values in this interval not displayed.    Basic Metabolic Panel:  Lab Results  Component Value Date   NA 141 12/20/2023   K 3.4 (L) 12/20/2023   CO2 27 12/20/2023   GLUCOSE 94 12/20/2023   BUN 7 12/20/2023   CREATININE 0.67 12/20/2023   CALCIUM  8.4 (L) 12/20/2023   GFRNONAA >60 12/20/2023   GFRAA >60 09/30/2018    INR  Lab Results  Component Value Date   INR 1.3 (H) 12/10/2023   APTT  Lab Results  Component Value Date   APTT 26 12/07/2023   Serum Autoimmune Myelopathy panel has resulted and is pan-negative: Anti-Hu Ab                     Negative                  BN     Reference Range: Negative                              This test was developed and its performance characteristics  determined by [facility]. It has not been cleared or approved  by the Food and Drug Administration.  Anti-Ri Ab                     Negative                  BN     Reference Range: Negative  This test was developed and its performance characteristics  determined by [facility]. It has not been cleared or approved  by the Food and Drug Administration.  Antineuronal nuclear Ab Type 3 Negative                  BN     Reference Range: Negative                              This test was developed and its performance characteristics  determined by [facility]. It has not been cleared or approved  by the Food and Drug Administration.  PCA Type-1 (Anti-Yo) Ab        Negative                  BN     Reference Range: Negative                              This test was developed and its performance characteristics  determined by [facility]. It has not been cleared or approved  by the Food and Drug Administration.  Purkinje Cell Cyto Ab Type 2   Negative                  BN      Reference Range: Negative                              This test was developed and its performance characteristics  determined by [facility]. It has not been cleared or approved  by the Food and Drug Administration.  Purkinje Cell Cyto Ab Type Tr  Negative                  BN     Reference Range: Negative                              This test was developed and its performance characteristics  determined by [facility]. It has not been cleared or approved  by the Food and Drug Administration.  Amphiphysin Antibody           Negative                  BN     Reference Range: Negative                              This test was developed and its performance characteristics  determined by [facility]. It has not been cleared or approved  by the Food and Drug Administration.  CRMP-5 IgG                     Negative                  BN     Reference Range: Negative                              This test was developed and its performance characteristics  determined by [facility]. It has not been cleared or approved  by the Food and Drug Administration.  CRMP-5 IgG Line Blot           Negative  BN     Reference Range: Negative                              This test was developed and its performance characteristics  determined by [facility]. It has not been cleared or approved  by the Food and Drug Administration.  AGNA-1                         Negative                  BN     Reference Range: Negative                              This test was developed and its performance characteristics  determined by [facility]. It has not been cleared or approved  by the Food and Drug Administration.  DPPX Antibody                  Negative                  BN     Reference Range: Negative                              This test was developed and its performance characteristics  determined by [facility]. It has not been cleared or approved  by the Food and Drug Administration.  mGluR1 Antibody                 Negative                  BN     Reference Range: Negative                              This test was developed and its performance characteristics  determined by [facility]. It has not been cleared or approved  by the Food and Drug Administration.  AMPA-R1 Antibody               Negative                  BN     Reference Range: Negative                              This test was developed and its performance characteristics  determined by [facility]. It has not been cleared or approved  by the Food and Drug Administration.  AMPA-R2 Antibody               Negative                  BN     Reference Range: Negative                              This test was developed and its performance characteristics  determined by [facility]. It has not been cleared or approved  by the Food and Drug Administration.  GABA-B-R Antibody              Negative  BN     Reference Range: Negative                              This test was developed and its performance characteristics  determined by [facility]. It has not been cleared or approved  by the Food and Drug Administration.  NMDA-R Antibody                Negative                  BN     Reference Range: Negative                              This test was developed and its performance characteristics  determined by [facility]. It has not been cleared or approved  by the Food and Drug Administration.  GAD65 Antibody                 Negative                  BN     Reference Range: Negative                              This test was developed and its performance characteristics  determined by [facility]. It has not been cleared or approved  by the Food and Drug Administration.  Aquaporin 4 Antibody           Negative                  BN     Reference Range: Negative                        CSF meningitis/encephalitis panel: Negative for all pathogens tested, including HSV1-2, VZV and HHV6   Assessment  Darlisha Kelm is a 53 y.o.  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 Her symptoms began with severe trigeminal neuralgia pain, which has been unremitting and resistant to medications.  - Exam today is as documented above. No limb weakness.  - Symptoms today: Headache has improved and is 7-8/10 in the top of her head and bilateral sides of the face.  We are hopeful that headache will continue to improve with more PLEX treatments. - Neuroimaging: - MRI orbits w/wo contrast: No evidence of acute or prior optic neuritis  - MRI brain w/wo contrast: No acute infarct. No acute intracranial hemorrhage. No mass effect or midline shift. No hydrocephalus. The sella is unremarkable. Normal flow voids. There is mild subcortical white matter disease present bilaterally - MRI C-spine w/wo contrast:  Abnormal T2 signal throughout the visualized cervical cord and abnormal enhancement along the right lateral and left posterolateral surfaces of the upper cervical spinal cord. The findings are concerning for demyelinating disease, but neoplasm cannot be excluded.  - MRI L-spine w/wo contrast: No spinal canal stenosis or neural foraminal narrowing in the lumbar spine.  - MRI T-spine w/wo contrast: No evidence of demyelinating disease involving the thoracic spinal cord.  - Repeat  MRI orbits w/wo contrast (9/30): Question focal atrophy involving the distal left optic nerve just posterior to the left globe. No significant abnormal enhancement to suggest active or acute optic neuritis at this time. Otherwise unremarkable MRI of the orbits. - EKG: Sinus tachycardia; Otherwise normal ECG - Labs: - CSF meningitis/encephalitis  panel: Negative for all pathogens tested, including HSV1-2, VZV and HHV6 - CSF with elevated WBC of 17, predominantly lymphocytes. Elevated IgG index of 1.2. Total protein normal at 31.  - Serum Autoimmune Myelopathy panel has resulted and is pan-negative: - C-ANCA normal. P-ANCA normal. Atypical P-ANCA titer normal.  - ENA panel: - ENA SM Ab Ser-aCnc: Normal - Ribonucleic protein: Normal - Differential diagnosis: - Although she has negative anti-MOG antibodies, her MRI findings are typical for this entity and MOGAD (myelin-oligodendrocyte glycoprotein antibody-associated disease) can present with acute trigeminal neuralgia as well, based on multiple case reports documented in the literature. Although anti-MOG Ab titers do have high sensitivity of about 90%, MOGAD is still relatively high on the differential despite her negative titer due to the clinical and imaging characteristics.  - Anti-aqp4 antibodies were also negative, which makes neuromyelitis optica much less likely given that she has no brain lesions and no symptoms or signs of optic neuritis.   - At this point, even if this is not MOGAD, out suspicion that this is some type of autoimmune process is very high and for this reason she has been started on plasmapheresis. - Her LP shows mild inflammation as would be expected with an inflammatory myelitis.  - ID saw the patient on 10/1 and feel that it is unlikely that the etiology of all of her neuro findings are secondary to HSV. They have called [facility] and have added on HSV1 and 2 IgG to her CSF fluid that had already been sent out. They do not recommend starting anti virals at this time.  - CT chest abdomen pelvis looking for possible paraneoplastic association, in conjunction with colonoscopy, are negative for a mass lesion (possible apple-core lesion on CT was refuted by colonoscopy).   - Platelets initially were normal, then declined with patient thrombocytopenic. Last normal platelet count  was 203 on 9/28, subsequently 148 (9/29) >> 121 (9/30) >> 110 >> 123 >> 141 >> 162 >> 190(10/5). Now trending upwards after lowering of her CBZ dose, but may also be due to holding her PLEX treatments. DDx includes secondary to carbamazepine  versus a rare complication of PLEX.  - Complained of recurrence of blurred vision on Tuesday. DDx includes due to NMO versus MOGAD versus secondary to headache pain. Her blurred vision was worse with either eye closed, better with binocular vision. Dr. Corbin of Ophthalmology ordered MRI orbits although received steroids so not really sure what else to do.  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 HSV can also be associated with transverse myelitis (herpes zoster myelitis), but CSF HSV PCR was negative.  - Severe trigeminal neuralgia of recent onset. Possible etiologies include autoimmune, versus secondary to HSV (although CSF HSV PCR was negative).   As of 10/7, patient has completed 5 of 5 sessions of PLEX. TN pain is slightly improved but still severe and she needs better pain control before discharge. Repeat MRI brain and c spine showed improvement in abnormal T2/FLAIR signal from the cervicomedullary junction to T6 with some residual enhancement (residual enhancement may persist for weeks with or without treatment). I reviewed the actual images from  the 9/24 and 10/6 scans with patient and her husband at bedside. Her carbamazepine  was reduced from 200 tid to 100 tid 2/2 thrombocytopenia and poor efficacy and her gabapentin  and cymbalta  are unchanged from home doses. I told her that unfortunately I do not expect to be able to get her completely pain-free prior to discharge but we do need a better regimen in terms of her near-term quality of life and also to reduce unnecessary use of opiates.  I also explained that we cannot make too many medication changes that once otherwise we will  not know what is effective or what is causing side effects.    On 10/8 she started on baclofen 5mg  tid and tolerated it well without side effects. This can be uptitrated to 10mg  tid in 3-5 days.   On 10/9 we increased her gabapentin  to 900mg  qid. This is max dose and cannot be uptitrated further. She is tolerating 900mg  tid well without excess lethargy and states it also helps with her anxiety.   I have decided against adding lamotrigine  bc we would likely need to uptitrate to a high total daily dose which could increase the arrhythmogenic effectives of lamotrigine . If she goes off carbamazepine  in the future lamotrigine  should be reconsidered as an adjunct (not primary) therapy.   After discharge botox injections may be considered although efficacy in TN was only demonstrated in a few small trials.  In terms of rescue medications after discharge intranasal lidocaine  spray is one of the most effective options.  This can only be compounded at a specialty pharmacy and this is not something that I can prescribe for her at discharge, it would have to be prescribed in clinic and sent to specialty compounding pharmacy and may require prior auth.  After discharge she will need close follow-up with Dr. Vear at [facility] and also an urgent referral to [facility] specialty [facility].  Recommendations  - Follow-up CSF myelitis panel, [facility] test code MAC1 - SSA, SSBA titers pending  - Continue baclofen 5mg  tid, if she tolerates it well can increase to 10mg  tid in 3-5 days - Increase gabapentin  to 900 mg qid - Continue carbamazepine  100mg  tid - Opiate pain medication as needed (sparingly) for her severe trigeminal neuralgia pain - Her pain today is primarily severe postorbital headache so will give headache cocktail toradol /benadryl /compazine  + decadron. If she still has headache at 5pm plan to give 1g VPA. She has not been sexually active since her last negative pregnancy test 08/29/23 - PLEX 5 of 5  completed 10/6 - Continue to monitor her vision for any changes - Will need close follow-up with Dr. Vear after hospital discharge. She will also benefit from urgent referral to [facility] specialty clinic for headache and facial pain. - PT/OT - Neurology will continue to follow __________________  Elida Ross, MD [facility] [phone]  If 7pm- 7am, please page neurology on call as listed in [facility].

## 2023-12-21 NOTE — Plan of Care (Signed)
  Problem: Education: Goal: Knowledge of General Education information will improve Description: Including pain rating scale, medication(s)/side effects and non-pharmacologic comfort measures 12/21/2023 1221 by Lynnette Cena CROME, RN Outcome: Adequate for Discharge 12/21/2023 1131 by Lynnette Cena CROME, RN Outcome: Progressing 12/21/2023 1127 by Lynnette Cena CROME, RN Outcome: Progressing   Problem: Health Behavior/Discharge Planning: Goal: Ability to manage health-related needs will improve Outcome: Adequate for Discharge   Problem: Clinical Measurements: Goal: Ability to maintain clinical measurements within normal limits will improve Outcome: Adequate for Discharge Goal: Will remain free from infection Outcome: Adequate for Discharge Goal: Diagnostic test results will improve Outcome: Adequate for Discharge Goal: Respiratory complications will improve Outcome: Adequate for Discharge Goal: Cardiovascular complication will be avoided Outcome: Adequate for Discharge   Problem: Activity: Goal: Risk for activity intolerance will decrease Outcome: Adequate for Discharge   Problem: Nutrition: Goal: Adequate nutrition will be maintained Outcome: Adequate for Discharge   Problem: Coping: Goal: Level of anxiety will decrease Outcome: Adequate for Discharge   Problem: Elimination: Goal: Will not experience complications related to bowel motility Outcome: Adequate for Discharge Goal: Will not experience complications related to urinary retention Outcome: Adequate for Discharge   Problem: Pain Managment: Goal: General experience of comfort will improve and/or be controlled Outcome: Adequate for Discharge   Problem: Safety: Goal: Ability to remain free from injury will improve Outcome: Adequate for Discharge   Problem: Skin Integrity: Goal: Risk for impaired skin integrity will decrease Outcome: Adequate for Discharge

## 2023-12-21 NOTE — TOC Transition Note (Signed)
 Transition of Care [facility]) - Discharge Note   Patient Details  Name: Patricia Patton MRN: [id number] Date of Birth: 1970-08-14  Transition of Care [facility]) CM/SW Contact:  Andrez JULIANNA George, RN Phone Number: 12/21/2023, 1:50 PM   Clinical Narrative:     Pt discharged home with self care. No needs per IP Care management.   Final next level of care: Home/Self Care Barriers to Discharge: No Barriers Identified   Patient Goals and CMS Choice            Discharge Placement                       Discharge Plan and Services Additional resources added to the After Visit Summary for     Discharge Planning Services: CM Consult                                 Social Drivers of Health (SDOH) Interventions SDOH Screenings   Food Insecurity: No Food Insecurity (12/06/2023)  Housing: Low Risk  (12/06/2023)  Transportation Needs: No Transportation Needs (12/06/2023)  Utilities: Not At Risk (12/06/2023)  Financial Resource Strain: Low Risk  (07/30/2023)   Received from [facility]  Physical Activity: Insufficiently Active (08/13/2018)   Received from [facility]  Social Connections: Unknown (08/13/2018)   Received from [facility]  Tobacco Use: Medium Risk (12/12/2023)  Health Literacy: Low Risk  (06/20/2020)   Received from [facility]     Readmission Risk Interventions     No data to display

## 2023-12-21 NOTE — Progress Notes (Signed)
 The following discharge instructions were written by me and printed and given to Patricia Patton:  You are on the following medications for trigeminal neuralgia and head pain:  1. Carbamazepine  100mg  three times a day 2. Gabapentin  900mg  4 times a day 3. Duloxetine  DR 60mg  daily 4. Baclofen 5mg  three times a day. If you are still having pain from trigeminal neuralgia you may increase baclofen to 10mg  three times a day. If you are not still having severe pain, break the 10mg  tablets in half and continue taking 5mg  three times a day (half a pill)  These medications can make you sleepy or feel foggy or off balance. If this happens decrease gabapentin  to 900mg  three times a day and talk to your PCP. If you develop a rash or blisters on your skin or your mucosa (mouth, vulva) seek care immediately at urgent care or the ER.  Please talk to your doctor before stopping baclofen. Unless you have significant side effects or overdose emergency, stopping baclofen cold-turkey can cause withdrawal symptoms. Call your doctor to discuss how to taper off gradually (usually 1-2 weeks) to avoid withdrawal.  Another interaction to be aware of is that carbamazepine  and trazodone  can interact to change each others metabolism. Carbmazepine can decrease the amount of trazodone  in your blood. Trazaodone can increase the amount of carbamazepine  in your blood. For that reason I would avoid increasing your carbamazepine  to anything more than your current dose of 100mg  three times a day.   If you take over the counter medications such as tylenol  or ibuprofen , use these sparingly (no more than 2x/wk) to avoid rebound headaches.  We have made outpatient referrals to: - Dr. Vear, [facility] in [place] [phone]) - Dr. Lupita Chancy at [facility] [phone])  The referrals were marked as urgent for you to be seen within 2 weeks. The clinics will call you to make an  appointment. If you do not hear from them by early next week you may call the phone numbers above to make an appointment.  We will also refer you to ophthalmology.

## 2023-12-21 NOTE — Discharge Summary (Signed)
 Physician Discharge Summary   Patricia Patton [facility] FMW:[id number] DOB: 02-Jan-1971 DOA: 12/06/2023  PCP: Claudene Rayfield HERO, MD  Admit date: 12/06/2023 Discharge date: 12/21/2023  Admitted From: Home Disposition:  Home Discharging physician: Alm Apo, MD Barriers to discharge: none  Recommendations at discharge: Referral placed to ophthalmology Follow-up with [facility], Dr. Vear Referral placed to [facility]   Discharge Condition: stable CODE STATUS: Full  Diet recommendation:  Diet Orders (From admission, onward)     Start     Ordered   12/21/23 0000  Diet general        12/21/23 1152   12/12/23 1535  Diet regular Fluid consistency: Thin  Diet effective now       Question:  Fluid consistency:  Answer:  Thin   12/12/23 1534            Hospital Course: 53 yo with hx R sided trigeminal neuralgia, anxiety, depression, and multiple other medical issues transferred from [facility], now being followed by neurology with concern for transverse myelitis.    Assessment & Plan:   Acute Transverse Myelitis  - MRI with abnormal increased T2 signal within the spinal cord extending from cervical medullary junction to the mid body of C6, with abnormal enhancement posterolaterally on the R at cervical medullary junction and posterolaterally on the L at C1 and C2 vertebral bodies c/w demyelinating disease - MRI T spine without demyelinating disease.  MRI L spine without spinal canal stenosis or neural foraminal narrowing in lumbar spine.  MRI orbits without evidence of acute or prior optic neuritis.  MRI brain with abnormal cervical findings noted concerning for demyelinating disease. - repeat MRI orbits with ? Focal atrophy of distal L optic nerve (no significant abnormal enhancement to suggest active or acute optic neuritis) - s/p LP with elevated WBC count, lymph predominant.  Protein 31, Glucose 83.  Negative meningitis/encephalitis panel.  Negative oligoclonal  bands - negative autoimmune myelopathy panel - negative neuromyelitis optica ab - negative aqp4 ab and mog ab - negative antiextractable nuclear ag, negative anca, negative ANA - pending anti ENA plus (miscellaneous [facility] test 9/27) - miscellaneous [facility] test 9/26 -Neurology adjusting medications continuously - S/p Plex x 5 days - Tolerating gabapentin  900 mg 3 times daily; increased to QID at discharge - Continue baclofen and Tegretol .  Now recommended for holding off on lamotrigine  unless comes off of Tegretol  in the future - Outpatient follow-up with Dr. Vear at [facility] planned and urgent referral to [facility] - Outpatient follow-up with ophthalmology  Trigeminal Neuralgia - appreciate neurology assistance - continuing tegretol , gabapentin    Acne S/p flagyl cream   Irregular Bowel Wall thickening concerning for malignancy - s/p colonoscopy which was normal - needs repeat colonoscopy in 10 years for screening purposes   Depression  Anxiety - cymbalta , trazodone    Small Fiber Neuropathy - continue cymbalta , gabapentin   The patient's acute and chronic medical conditions were treated accordingly. On day of discharge, patient was felt deemed stable for discharge. Patient/family member advised to call PCP or come back to ER if needed.   Principal Diagnosis: Neuromyelitis optica spectrum disorder [facility])  Discharge Diagnoses: Active Hospital Problems   Diagnosis Date Noted   Neuromyelitis optica spectrum disorder (HCC) 12/07/2023   Grade II internal hemorrhoids 12/12/2023   Abnormal finding on GI tract imaging 12/11/2023   Anxiety and depression 12/07/2023    Resolved Hospital Problems  No resolved problems to display.  Discharge Instructions     Ambulatory referral to Neurology   Complete by: As directed    An appointment is requested in approximately: 2 weeks   Ambulatory referral to Neurology   Complete by: As directed    An  appointment is requested in approximately: 2 wks   Ambulatory referral to Ophthalmology   Complete by: As directed    Diet general   Complete by: As directed    Increase activity slowly   Complete by: As directed       Allergies as of 12/21/2023       Reactions   Amoxicillin-pot Clavulanate Hives, Swelling   Specifically Augmentin         Medication List     STOP taking these medications    meloxicam 15 MG tablet Commonly known as: MOBIC   methylPREDNISolone  sodium succinate 1,000 mg in sodium chloride  0.9 % 50 mL   oxyCODONE -acetaminophen  5-325 MG tablet Commonly known as: PERCOCET/ROXICET   pantoprazole  40 MG injection Commonly known as: PROTONIX    Valtrex 1000 MG tablet Generic drug: valACYclovir       TAKE these medications    acetaminophen  325 MG tablet Commonly known as: TYLENOL  Take 2 tablets (650 mg total) by mouth every 4 (four) hours as needed for mild pain (pain score 1-3), fever or headache.   baclofen 10 MG tablet Commonly known as: LIORESAL Take 0.5-1 tablets (5-10 mg total) by mouth 3 (three) times daily. If you are still having pain from trigeminal neuralgia you may increase baclofen to 10mg  three times a day. If you are not still having severe pain, break the 10mg  tablets in half and continue taking 5mg  three times a day (half a pill)   carbamazepine  100 MG chewable tablet Commonly known as: TEGRETOL  Chew 100 mg by mouth 3 (three) times daily.   DULoxetine  60 MG capsule Commonly known as: CYMBALTA  Take 60 mg by mouth daily.   gabapentin  300 MG capsule Commonly known as: NEURONTIN  Take 3 capsules (900 mg total) by mouth 4 (four) times daily. What changed:  how much to take when to take this   ondansetron  4 MG tablet Commonly known as: ZOFRAN  Take 1 tablet (4 mg total) by mouth every 8 (eight) hours as needed for nausea or vomiting.   Oxycodone  HCl 10 MG Tabs Take 1 tablet (10 mg total) by mouth every 4 (four) hours as needed for  severe pain (pain score 7-10) or moderate pain (pain score 4-6). What changed:  medication strength how much to take when to take this reasons to take this   traZODone  150 MG tablet Commonly known as: DESYREL  Take 150 mg by mouth at bedtime.        Allergies  Allergen Reactions   Amoxicillin-Pot Clavulanate Hives and Swelling    Specifically Augmentin     Consultations: Neurology  Procedures:   Discharge Exam: BP (!) 101/54 (BP Location: Right Arm)   Pulse 88   Temp 98.1 F (36.7 C) (Oral)   Resp 16   Ht 5' 2 (1.575 m)   Wt 61.6 kg Comment: stand up scale  SpO2 96%   BMI 24.84 kg/m  Physical Exam Constitutional:      Appearance: Normal appearance.  HENT:     Head: Normocephalic and atraumatic.     Mouth/Throat:     Mouth: Mucous membranes are moist.  Eyes:     General: No visual field deficit.    Extraocular Movements: Extraocular movements intact.  Cardiovascular:  Rate and Rhythm: Normal rate and regular rhythm.  Pulmonary:     Effort: Pulmonary effort is normal. No respiratory distress.     Breath sounds: Normal breath sounds. No wheezing.  Abdominal:     General: Bowel sounds are normal. There is no distension.     Palpations: Abdomen is soft.     Tenderness: There is no abdominal tenderness.  Musculoskeletal:        General: Normal range of motion.     Cervical back: Normal range of motion and neck supple.  Skin:    General: Skin is warm and dry.  Neurological:     General: No focal deficit present.     Mental Status: She is alert and oriented to person, place, and time.     Motor: Motor function is intact. No weakness.     Comments: Skin hypersensitivity appreciated in right facial distribution fields  Psychiatric:        Mood and Affect: Mood normal.      The results of significant diagnostics from this hospitalization (including imaging, microbiology, ancillary and laboratory) are listed below for reference.   Microbiology: No  results found for this or any previous visit (from the past 240 hours).   Labs: BNP (last 3 results) No results for input(s): BNP in the last 8760 hours. Basic Metabolic Panel: Recent Labs  Lab 12/15/23 0154 12/17/23 0500 12/18/23 0610 12/19/23 0640 12/20/23 0609  NA 140 141 141 142 141  K 4.4 3.9 3.5 3.6 3.4*  CL 103 107 104 107 103  CO2 29 28 27 26 27   GLUCOSE 127* 95 87 87 94  BUN 16 8 8 6 7   CREATININE 0.73 0.59 0.52 0.60 0.67  CALCIUM  8.6* 8.8* 8.5* 8.8* 8.4*  MG 2.3  --   --   --   --   PHOS 4.4  --   --   --   --    Liver Function Tests: Recent Labs  Lab 12/15/23 0154  AST 29  ALT 47*  ALKPHOS 36*  BILITOT 0.6  PROT 5.3*  ALBUMIN  3.4*   No results for input(s): LIPASE, AMYLASE in the last 168 hours. No results for input(s): AMMONIA in the last 168 hours. CBC: Recent Labs  Lab 12/15/23 0154 12/16/23 0925 12/17/23 0500 12/18/23 0610 12/19/23 0640 12/20/23 0609  WBC 7.9 6.6 8.4 8.2 7.9 7.7  NEUTROABS 3.8 4.1 4.8  --   --   --   HGB 11.9* 10.5* 10.8* 9.9* 9.8* 9.9*  HCT 36.1 32.5* 33.0* 30.3* 29.9* 30.2*  MCV 92.6 93.1 91.9 92.1 92.0 92.1  PLT 141* 162 190 218 218 254   Cardiac Enzymes: No results for input(s): CKTOTAL, CKMB, CKMBINDEX, TROPONINI in the last 168 hours. BNP: Invalid input(s): POCBNP CBG: No results for input(s): GLUCAP in the last 168 hours. D-Dimer No results for input(s): DDIMER in the last 72 hours. Hgb A1c No results for input(s): HGBA1C in the last 72 hours. Lipid Profile No results for input(s): CHOL, HDL, LDLCALC, TRIG, CHOLHDL, LDLDIRECT in the last 72 hours. Thyroid function studies No results for input(s): TSH, T4TOTAL, T3FREE, THYROIDAB in the last 72 hours.  Invalid input(s): FREET3 Anemia work up No results for input(s): VITAMINB12, FOLATE, FERRITIN, TIBC, IRON, RETICCTPCT in the last 72 hours. Urinalysis    Component Value Date/Time   COLORURINE YELLOW  (A) 09/30/2018 1525   APPEARANCEUR HAZY (A) 09/30/2018 1525   LABSPEC 1.023 09/30/2018 1525   PHURINE 5.0 09/30/2018 1525  GLUCOSEU NEGATIVE 09/30/2018 1525   HGBUR SMALL (A) 09/30/2018 1525   BILIRUBINUR NEGATIVE 09/30/2018 1525   KETONESUR 5 (A) 09/30/2018 1525   PROTEINUR NEGATIVE 09/30/2018 1525   NITRITE NEGATIVE 09/30/2018 1525   LEUKOCYTESUR NEGATIVE 09/30/2018 1525   Sepsis Labs Recent Labs  Lab 12/17/23 0500 12/18/23 0610 12/19/23 0640 12/20/23 0609  WBC 8.4 8.2 7.9 7.7   Microbiology No results found for this or any previous visit (from the past 240 hours).  Procedures/Studies: MR BRAIN W WO CONTRAST Result Date: 12/18/2023 CLINICAL DATA:  Demyelinating disease EXAM: MRI HEAD WITHOUT AND WITH CONTRAST TECHNIQUE: Multiplanar, multiecho pulse sequences of the brain and surrounding structures were obtained without and with intravenous contrast. CONTRAST:  6mL GADAVIST  GADOBUTROL  1 MMOL/ML IV SOLN COMPARISON:  12/12/2023 FINDINGS: MRI brain: Again noted is a focus of T2 hyperintensity in the right posterolateral aspect of the cervicomedullary junction with enhancement and a small focus of T2 hyperintensity in the left posterolateral aspect of the cervicomedullary junction with enhancement. There is a subcentimeter focus of T2 hyperintensity in the right subcortical white matter. This does not enhance. There are few punctate foci of T2 hyperintensity elsewhere within the white matter. The periventricular and corpus callosal lesions typical of multiple sclerosis are not present. No other significant signal abnormality in the brain parenchyma. No abnormal enhancement. There is no acute or chronic infarct. The ventricles are normal. No mass lesion. There are normal flow signals in the carotid arteries and basilar artery. No significant bone marrow signal abnormality. No significant abnormality in the paranasal sinuses or soft tissues. IMPRESSION: 1. Foci of T2 hyperintensity with  enhancement in the right and left posterolateral aspect of the cervicomedullary junction. These are better seen on the cervical spine MRI. They most likely represent foci of demyelination. 2. Small nonenhancing subcentimeter focus of T2 hyperintensity in the right subcortical white matter, uncertain etiology. There are few other punctate foci of T2 hyperintensity in the cerebral white matter which do not enhance and are not likely significant. 3. The periventricular and corpus callosal lesions most typical of multiple sclerosis are not present. Electronically Signed   By: Nancyann Burns M.D.   On: 12/18/2023 15:56   MR CERVICAL SPINE W WO CONTRAST Result Date: 12/18/2023 CLINICAL DATA:  Demyelinating disease EXAM: MRI CERVICAL SPINE WITHOUT AND WITH CONTRAST TECHNIQUE: Multiplanar and multiecho pulse sequences of the cervical spine, to include the craniocervical junction and cervicothoracic junction, were obtained without and with intravenous contrast. CONTRAST:  6mL GADAVIST  GADOBUTROL  1 MMOL/ML IV SOLN COMPARISON:  December 06, 2023 FINDINGS: The degree of T2 hyperintensity within the cord is significantly decreased compared with the prior study. There are persistent areas of abnormal T2 signal and enhancement in the right posterolateral aspect of the cord at the cervicomedullary junction and the left posterolateral aspect of the cord extending from the cervicomedullary junction to the C2-3 level similar to the prior study. Possible cord lesion at the T3-4 level without enhancement. IMPRESSION: The degree of T2 hyperintensity within the cord noted on the prior study has significantly decreased. There are persistent areas of abnormal T2 signal and enhancement in the right posterolateral aspect of the cord at the cervicomedullary junction and the left posterolateral aspect of the cord extending from the cervicomedullary junction to the C2-3 level similar to the prior study. Possible cord lesion at the T3-4 level  without enhancement. Electronically Signed   By: Nancyann Burns M.D.   On: 12/18/2023 15:34   MR ORBITS W WO CONTRAST  Result Date: 12/12/2023 CLINICAL DATA:  Initial evaluation for endo most spectrum disorder. EXAM: MRI OF THE ORBITS WITHOUT AND WITH CONTRAST TECHNIQUE: Multiplanar, multi-echo pulse sequences of the orbits and surrounding structures were acquired including fat saturation techniques, before and after intravenous contrast administration. CONTRAST:  6mL GADAVIST  GADOBUTROL  1 MMOL/ML IV SOLN COMPARISON:  MRI from 12/06/2023 FINDINGS: Orbits:  Examination degraded by motion artifact. Globes are symmetric in size with normal appearance and morphology bilaterally. There is question of focal atrophy involving the distal left optic nerve just posterior to the left globe (series 4, image 17). No significant abnormal enhancement is visible to suggest active or acute optic neuritis, although evaluation limited by motion. Right optic nerve normal in appearance. Intraconal and extraconal fat maintained. Extra-ocular muscles symmetric and within normal limits. No abnormality about the orbital apices or cavernous sinus. Optic chiasm normally situated within the suprasellar cistern. Lacrimal glands within normal limits. Visualized sinuses: Minor mucoperiosteal thickening present about the ethmoidal air cells. Paranasal sinuses are otherwise clear. Soft tissues: No visible periorbital soft tissue swelling. Limited intracranial: Unremarkable. IMPRESSION: 1. Question focal atrophy involving the distal left optic nerve just posterior to the left globe. No significant abnormal enhancement to suggest active or acute optic neuritis at this time. 2. Otherwise unremarkable MRI of the orbits. Electronically Signed   By: Morene Hoard M.D.   On: 12/12/2023 20:44   CT CHEST ABDOMEN PELVIS W CONTRAST Result Date: 12/09/2023 CLINICAL DATA:  Occult malignancy.  Possible neoplasm of the brain. EXAM: CT CHEST, ABDOMEN,  AND PELVIS WITH CONTRAST TECHNIQUE: Multidetector CT imaging of the chest, abdomen and pelvis was performed following the standard protocol during bolus administration of intravenous contrast. RADIATION DOSE REDUCTION: This exam was performed according to the departmental dose-optimization program which includes automated exposure control, adjustment of the mA and/or kV according to patient size and/or use of iterative reconstruction technique. CONTRAST:  75mL OMNIPAQUE IOHEXOL 350 MG/ML SOLN COMPARISON:  None Available. FINDINGS: CT CHEST FINDINGS Cardiovascular: Partially visualized right chest wall dialysis catheter with tip at the superior cavoatrial junction. Normal heart size. No significant pericardial effusion. The thoracic aorta is normal in caliber. No atherosclerotic plaque of the thoracic aorta. No coronary artery calcifications. Mediastinum/Nodes: No enlarged mediastinal, hilar, or axillary lymph nodes. Thyroid gland, trachea, and esophagus demonstrate no significant findings. Lungs/Pleura: No focal consolidation. No pulmonary nodule. No pulmonary mass. No pleural effusion. No pneumothorax. Musculoskeletal: No chest wall abnormality. No suspicious lytic or blastic osseous lesions. No acute displaced fracture. CT ABDOMEN PELVIS FINDINGS Hepatobiliary: No focal liver abnormality. No gallstones, gallbladder wall thickening, or pericholecystic fluid. No biliary dilatation. Pancreas: No focal lesion. Normal pancreatic contour. No surrounding inflammatory changes. No main pancreatic ductal dilatation. Spleen: Normal in size without focal abnormality. Adrenals/Urinary Tract: No adrenal nodule bilaterally. Bilateral kidneys enhance symmetrically. No hydronephrosis. No hydroureter. The urinary bladder is unremarkable. On delayed imaging, there is no urothelial wall thickening and there are no filling defects in the opacified portions of the bilateral collecting systems or ureters. Stomach/Bowel: Stomach is  within normal limits. No evidence of small bowel wall thickening or dilatation. Short segment ascending colon irregular bowel wall thickening with applee-core appearing lesion (6:52, 7:22). Stool throughout the majority of the colon. Appendix appears normal. Vascular/Lymphatic: No abdominal aorta or iliac aneurysm. No abdominal, pelvic, or inguinal lymphadenopathy. Reproductive: Uterus and bilateral adnexa are unremarkable. Other: No intraperitoneal free fluid. No intraperitoneal free gas. No organized fluid collection. Musculoskeletal: No abdominal wall hernia or abnormality. No suspicious lytic  or blastic osseous lesions. No acute displaced fracture. IMPRESSION: 1. Short segment ascending colon irregular bowel wall thickening concerning for malignancy. Recommend further evaluation with colonoscopy. 2. No acute intrathoracic or intrapelvic abnormality. Electronically Signed   By: Morgane  Naveau M.D.   On: 12/09/2023 20:23   IR NON-TUNNELED CENTRAL VENOUS CATH [facility] W IMG Result Date: 12/07/2023 INDICATION: HD trilaysis catheter placement for PLEX EXAM: NON-TUNNELED CENTRAL VENOUS PLASMAPHERESIS CATHETER PLACEMENT WITH ULTRASOUND AND FLUOROSCOPIC GUIDANCE COMPARISON:  CHEST XR, 09/30/2018. MEDICATIONS: None FLUOROSCOPY: Radiation Exposure Index and estimated peak skin dose (PSD); Reference air kerma (RAK), 0.1 mGy. COMPLICATIONS: None immediate. PROCEDURE: Informed written consent was obtained from the patient after a discussion of the risks, benefits, and alternatives to treatment. Questions regarding the procedure were encouraged and answered. The RIGHT neck and chest were prepped with chlorhexidine  in a sterile fashion, and a sterile drape was applied covering the operative field. Maximum barrier sterile technique with sterile gowns and gloves were used for the procedure. A timeout was performed prior to the initiation of the procedure. After the overlying soft tissues were anesthetized, a small venotomy  incision was created and a micropuncture kit was utilized to access the internal jugular vein. Real-time ultrasound guidance was utilized for vascular access including the acquisition of a permanent ultrasound image documenting patency of the accessed vessel. The microwire was utilized to measure appropriate catheter length. A stiff glidewire was advanced to the level of the IVC. Under fluoroscopic guidance, the venotomy was serially dilated, ultimately allowing placement of a 16 cm temporary Trialysis catheter with tip ultimately terminating within the superior aspect of the right atrium. Final catheter positioning was confirmed and documented with a spot radiographic image. The catheter aspirates and flushes normally. The catheter was flushed with appropriate volume heparin  dwells. The catheter exit site was secured with a 2-0 Ethilon retention suture. A dressing was placed. The patient tolerated the procedure well without immediate post procedural complication. IMPRESSION: Successful placement of a RIGHT internal jugular approach 16 cm non-tunneled plasmapheresis catheter The tip of the catheter is positioned within the proximal RIGHT atrium. The catheter is ready for immediate use. PLAN: This catheter may be converted to a tunneled plasmapheresis catheter at a later date as indicated. Thom Hall, MD [facility] Radiology Electronically Signed   By: Thom Hall M.D.   On: 12/07/2023 12:07   MR ORBITS W WO CONTRAST Result Date: 12/06/2023 EXAM: MRI ORBITS WITHOUT AND WITH CONTRAST 12/06/2023 06:55:38 AM TECHNIQUE: Multiplanar, multisequence MRI of the orbits was performed without and with intravenous contrast. 7 mL gadobutrol  (GADAVIST ) 1 MMOL/ML injection 7 mL GADOBUTROL  1 MMOL/ML IV SOLN. COMPARISON: MRI of the head dated 12/05/2023. CLINICAL HISTORY: Acute onset trigeminal neuralgia bilaterally with findings c/f NMO in c spine. Eval for e/o acute or prior  optic neuritis. 7ml gadavist . Worsening of headaches, facial pain and shoulder pain. Abnormal Brain MRI 12/05/2023. FINDINGS: ORBITS: Normal globes. Lenses are normally located. The optic nerves are normal in morphology and signal intensity. The optic chiasm is unremarkable. Symmetric extraocular muscles. No orbital mass. No abnormal enhancement. No appreciable abnormal cranial nerve enhancement. SINUSES AND MASTOIDS: Clear. BRAIN: There are a few scattered foci of increased T2 signal present within the subcortical white matter bilaterally. There is T2 hyperintensity again demonstrated within the visualized cervical spinal cord and cervical medullary junction. There is persistent abnormal enhancement of the spinal cord on the right at the cervical medullary junction and on the left at the C1 and  C2 vertebral body levels. BONES AND SOFT TISSUES: Normal bone marrow signal. No acute soft tissue abnormality. IMPRESSION: 1. No evidence of acute or prior optic neuritis. 2. Persistent abnormal enhancement of the spinal cord on the right at the cervical medullary junction and on the left at the C1 and C2 vertebral body levels, consistent with demyelinating disease. Electronically signed by: Evalene Coho MD 12/06/2023 07:36 AM EDT RP Workstation: [id number]   MR THORACIC SPINE W WO CONTRAST Result Date: 12/06/2023 EXAM: MRI THORACIC SPINE WITHOUT AND WITH INTRAVENOUS CONTRAST 12/06/2023 06:55:38 AM TECHNIQUE: Multiplanar multisequence MRI of the thoracic spine was performed without and with the administration of intravenous contrast. 7 mL gadobutrol  (GADAVIST ) 1 MMOL/ML injection 7 mL GADOBUTROL  1 MMOL/ML IV SOLN was administered. COMPARISON: None available. CLINICAL HISTORY: Multiple sclerosis (MS). 7ml gadavist . Worsening of headaches, facial pain and shoulder pain. Abnormal Brain MRI 12/05/2023. FINDINGS: BONES AND ALIGNMENT: Normal alignment. Normal vertebral body heights. Bone marrow signal is unremarkable. No  abnormal enhancement. SPINAL CORD: The spinal cord is normal in morphology and signal intensity. There is no abnormal enhancement. There is no evidence of demyelinating disease involving the thoracic spinal cord. SOFT TISSUES: Unremarkable. DEGENERATIVE CHANGES: No significant disc herniation. No spinal canal stenosis or neural foraminal narrowing. IMPRESSION: 1. No evidence of demyelinating disease involving the thoracic spinal cord. Electronically signed by: Evalene Coho MD 12/06/2023 07:26 AM EDT RP Workstation: [id number]   MR Lumbar Spine W Tommye Contrast Result Date: 12/06/2023 EXAM: MRI LUMBAR SPINE 12/06/2023 06:55:38 AM TECHNIQUE: Multiplanar multisequence MRI of the lumbar spine was performed with and without the administration of 7mL gadobutrol  (GADAVIST ) 1 MMOL/ML. COMPARISON: None available. CLINICAL HISTORY: Multiple sclerosis (MS). 7ml gadavist . Worsening of headaches, facial pain and shoulder pain. Abnormal Brain MRI 12/05/2023. FINDINGS: BONES AND ALIGNMENT: Normal alignment. Normal vertebral body heights. Bone marrow signal is unremarkable. SPINAL CORD: The conus terminates normally. SOFT TISSUES: No paraspinal mass. L1-L2: No significant disc herniation. No spinal canal stenosis or neural foraminal narrowing. L2-L3: No significant disc herniation. No spinal canal stenosis or neural foraminal narrowing. L3-L4: No significant disc herniation. No spinal canal stenosis or neural foraminal narrowing. L4-L5: No significant disc herniation. No spinal canal stenosis or neural foraminal narrowing. L5-S1: No significant disc herniation. No spinal canal stenosis or neural foraminal narrowing. IMPRESSION: 1. No spinal canal stenosis or neural foraminal narrowing in the lumbar spine. Electronically signed by: Evalene Coho MD 12/06/2023 07:24 AM EDT RP Workstation: [id number]   MR CERVICAL SPINE W WO CONTRAST Result Date: 12/06/2023 EXAM: MRI CERVICAL SPINE WITH AND WITHOUT CONTRAST 12/06/2023  06:55:38 AM TECHNIQUE: Multiplanar multisequence MRI of the cervical spine was performed without and with the administration of intravenous contrast. COMPARISON: None available. CLINICAL HISTORY: Multiple sclerosis (MS). 7ml gadavist . Worsening of headaches, facial pain and shoulder pain. Abnormal Brain MRI 12/05/2023. FINDINGS: BONES AND ALIGNMENT: Normal alignment. Normal vertebral body heights. Marrow signal is unremarkable. No abnormal enhancement. SPINAL CORD: There is abnormal increased T2 signal present within the spinal cord extending from the cervical medullary junction to the mid body of C6. There is abnormal enhancement present posterolaterally on the right within the cord at the cervical medullary junction and posterolaterally on the left at the C1 and C2 vertebral body levels. The cord does not appear expanded. SOFT TISSUES: No paraspinal mass. C2-C3: No significant disc herniation. No spinal canal stenosis or neural foraminal narrowing. C3-C4: No significant disc herniation. No spinal canal stenosis or neural foraminal narrowing. C4-C5: No significant disc herniation. No spinal canal  stenosis or neural foraminal narrowing. C5-C6: No significant disc herniation. No spinal canal stenosis or neural foraminal narrowing. C6-C7: No significant disc herniation. No spinal canal stenosis or neural foraminal narrowing. C7-T1: No significant disc herniation. No spinal canal stenosis or neural foraminal narrowing. IMPRESSION: 1. Abnormal increased T2 signal within the spinal cord extending from the cervical medullary junction to the mid body of C6, with abnormal enhancement posterolaterally on the right at the cervical medullary junction and posterolaterally on the left at the C1 and C2 vertebral body levels, consistent with demyelinating disease. Infectious, inflammatory, and ischemic myelitis are considered less likely. Electronically signed by: Evalene Coho MD 12/06/2023 07:23 AM EDT RP Workstation:  [id number]   MR Brain W and Wo Contrast Result Date: 12/05/2023 EXAM: MRI BRAIN WITH AND WITHOUT CONTRAST 12/05/2023 05:56:58 AM TECHNIQUE: Multiplanar multisequence MRI of the head/brain was performed with and without the administration of intravenous contrast. COMPARISON: None available. CLINICAL HISTORY: Trigeminal neuralgia suspected, check for mass lesions evidence of multiple sclerosis. 7ml gadavist . Arrived via [facility] with CC of headache and dizziness. Per EMS, the headache has been ongoing for several months, but tonight the pain is 10/10. EMS reports that pain is described as stabbing and burning pain and is on the right side of the head. Pt states that it hurts to open right eye. FINDINGS: BRAIN AND VENTRICLES: No acute infarct. No acute intracranial hemorrhage. No mass effect or midline shift. No hydrocephalus. The sella is unremarkable. Normal flow voids. There is mild subcortical white matter disease present bilaterally. ORBITS: The optic nerves appear normal in morphology and signal intensity and demonstrate no abnormal enhancement. SINUSES: No acute abnormality. BONES AND SOFT TISSUES: Normal bone marrow signal and enhancement. No acute soft tissue abnormality. There is increased T2 signal present throughout the visualized spinal cord at the cervical medullary junction. There is also abnormal enhancement along the right lateral and left posterolateral surfaces of the upper cervical spinal cord. Follow up MRI of the cervical, thoracic and lumbar spine is recommended without and with gadolinium contrast. IMPRESSION: 1. Abnormal T2 signal throughout the visualized cervical cord and abnormal enhancement along the right lateral and left posterolateral surfaces of the upper cervical spinal cord. The findings are concerning for demyelinating disease, but neoplasm cannot be excluded. Recommend correlation with dedicated MRI of the cervical, thoracic, and lumbar spine without and with gadolinium contrast 2.  The above findings were discussed with Dr. Cyrena at 6:13 am 12/05/2023 Electronically signed by: Evalene Coho MD 12/05/2023 06:23 AM EDT RP Workstation: [id number]     Time coordinating discharge: Over 30 minutes    Alm Apo, MD  [facility] 12/21/2023, 3:30 PM

## 2023-12-21 NOTE — Plan of Care (Signed)
   Problem: Education: Goal: Knowledge of General Education information will improve Description Including pain rating scale, medication(s)/side effects and non-pharmacologic comfort measures Outcome: Progressing

## 2023-12-21 NOTE — Progress Notes (Signed)
 Discharge instructions, RX's and follow up appts explained and provided to patient and husband verbalized understanding. Patient eft floor via wheelchair accompanied by staff.  Dimitris Shanahan, Cena Helling, RN

## 2023-12-27 LAB — MISC LABCORP TEST (SEND OUT): Labcorp test code: 9985

## 2023-12-29 LAB — MISC LABCORP TEST (SEND OUT): Labcorp test code: 9985

## 2024-01-03 NOTE — Progress Notes (Unsigned)
 [facility]  PATIENT: Patricia Patton DOB: [birth year]  REFERRING DOCTOR OR PCP: Elida Ross, MD; Rayfield Sharps, MD SOURCE: Patient, notes from multiple emergency room visits and hospital stay, imaging and lab reports, multiple MRIs personally reviewed.  _________________________________   HISTORICAL  CHIEF COMPLAINT:  Chief Complaint  Patient presents with   New Patient (Initial Visit)    Pt in 11 with husband Pt having right sided neck and facial pain Pt states pain  going down both arms     HISTORY OF PRESENT ILLNESS:  I had the pleasure of seeing patient, Patricia Patton, at [facility] for neurologic consultation regarding her longitudinally extensive transverse myelitis.  She is a 53 year old woman who first presented to the emergency room on 11/27/2023 with a 6-week history of progressively worsening scalp pain, initially right-sided and then bilateral.   Before the hospital stay, she had been seen several times and was not helped by gabapentin , Valtrex, oxycodone  or NSAIDs.  Pain was in the bilateral forehead and temples.  There was no rash.  She was felt to have trigeminal neuralgia and treated with analgesics and Carbamezapine.  She had mild ataxia.  No numbness or weakness in linbas and no bladder difficulty.  She had mild right facial weakness.  .  She returned to the emergency room on 12/05/2023 due to worsening pain - predominantly severe right sided facial pain like a knife leaving her bedridden.  MRI of the brain showed abnormal signal near the cervicomedullary junction.  MRI of the spinal cord showed a longitudinally extensive transverse myelitis from the medulla to C6 and she was sent to [facility] for further evaluation and treatment.  Additional blood work including an autoimmune myelopathy panel, ANA, ANCA, ENA, was negative.  CSF was obtained and showed a small number of white blood cells, mostly lymphocytes but normal  protein.  Oligoclonal bands were normal.  IgG index was elevated.  She was treated with 5 days of plasmapheresis and steroids.  With these treatments, pain improved and she was discharged 12/21/2023.  She continues to experience right facial pain, thugh less than last month.   Pain increases with some movements.  With the current medications she feels the intensity of the pain is tolerable.  She notes mild reduced balance and uses the banister on stairs.  She denies numbness or weakness of the limbs.  No bladder or bowel issues.  Currently, she is currently on gabapentin  900 mg po qid and baclofen 10 mg qid, CBZ 100 mg tid and duloxetine  60 mg every day.  She tolerates these medications well.  She does not recall an antecedent viral infection or vaccination in the couple weeks before her symptoms started.  Around early 2024 she was diagnosed with small fiber polyneuropathy based on EMG and skin biopsy in May 2024.  She had been tried a medication such as nortriptyline, duloxetine  and gabapentin ..  Antidouble-stranded DNA was elevated and she had seen rheumatology.  In early 2024, vitamin B12, thiamine and hemoglobin A1c were fine.  An SPEP had not shown any paraprotein  Of note she was on [facility] in June but rapidly lost 25 pounds in 3 weeks so stopped and she felt weaker.  She switched to Zepbound for short period of time.  Weight was stable and diet was typical around the time of the onset of symptoms in the previous month.     Imaging: MRI of the head 12/05/2023 showed T2/FLAIR hyperintense changes at the cervicomedullary junction, right  greater than left.  There is enhancement along the right medulla and the left cervicomedullary junction and upper cervical spinal cord.SABRA    MRI of the cervical spine 12/06/2023 shows a longitudinally extensive transverse myelitis from the medulla to C6.  Enhancement is noted peripherally at the cervicomedullary junction and adjacent to C2-C3 on the left and in the  lower medulla on the right  MRI of the orbits 12/06/2023 was normal.  MRI of the thoracic and lumbar spine 12/06/2023 was normal.  No evidence of CNS process.  MRI of the head and cervical spine 12/18/2023 showed improvement compared to the 12/05/2023 and 12/06/2023 MRIs.  There are still small T2 hyperintense foci posterolaterally to the left and to the right at the cervicomedullary junction.  The focus on the right was more adjacent to the medulla while it was more adjacent to C1-C2 on the left.  There was enhancement after contrast administration.  The brain was otherwise normal for age with just a couple punctate T2/FLAIR hyperintense foci in the subcortical or deep white matter.     Laboratory: CSF 12/08/2023 showed no oligoclonal bands though the IgG index was elevated at 1.2.  Protein was normal.  White blood cell count was mildly elevated at 17 (90% lymphocytes) meningitis/encephalitis panel (including HSV 1/2/6) were negative  12/07/2023: ANA was negative, ENA was negative, ANCA was negative, HIV antibody was negative, , ESR/CRP was normal. Following antibodies were negative on 12/07/2023:  anti-aquaporin 4 , anti-MOG, NMDA receptor, GABA-B receptor, Ampyra receptor and mGluR1,AGNA-1, CRMP-5,Amphiphysin, Purkinje cell cytoplasmic, anti-hu.   REVIEW OF SYSTEMS: Constitutional: No fevers, chills, sweats, or change in appetite Eyes: No visual changes, double vision, eye pain Ear, nose and throat: No hearing loss, ear pain, nasal congestion, sore throat Cardiovascular: No chest pain, palpitations Respiratory:  No shortness of breath at rest or with exertion.   No wheezes GastrointestinaI: No nausea, vomiting, diarrhea, abdominal pain, fecal incontinence Genitourinary:  No dysuria, urinary retention or frequency.  No nocturia. Musculoskeletal:  No neck pain, back pain Integumentary: No rash, pruritus, skin lesions Neurological: as above Psychiatric: No depression at this time.  No  anxiety Endocrine: No palpitations, diaphoresis, change in appetite, change in weigh or increased thirst Hematologic/Lymphatic:  No anemia, purpura, petechiae. Allergic/Immunologic: No itchy/runny eyes, nasal congestion, recent allergic reactions, rashes  ALLERGIES: Allergies  Allergen Reactions   Amoxicillin-Pot Clavulanate Hives and Swelling    Specifically Augmentin     HOME MEDICATIONS:  Current Outpatient Medications:    acetaminophen  (TYLENOL ) 325 MG tablet, Take 2 tablets (650 mg total) by mouth every 4 (four) hours as needed for mild pain (pain score 1-3), fever or headache., Disp: , Rfl:    baclofen (LIORESAL) 10 MG tablet, Take 0.5-1 tablets (5-10 mg total) by mouth 3 (three) times daily. If you are still having pain from trigeminal neuralgia you may increase baclofen to 10mg  three times a day. If you are not still having severe pain, break the 10mg  tablets in half and continue taking 5mg  three times a day (half a pill), Disp: 30 tablet, Rfl: 1   DULoxetine  (CYMBALTA ) 60 MG capsule, Take 60 mg by mouth daily., Disp: , Rfl:    gabapentin  (NEURONTIN ) 300 MG capsule, Take 3 capsules (900 mg total) by mouth 4 (four) times daily., Disp: 360 capsule, Rfl: 3   ondansetron  (ZOFRAN ) 4 MG tablet, Take 1 tablet (4 mg total) by mouth every 8 (eight) hours as needed for nausea or vomiting., Disp: 20 tablet, Rfl: 0  oxyCODONE  10 MG TABS, Take 1 tablet (10 mg total) by mouth every 4 (four) hours as needed for severe pain (pain score 7-10) or moderate pain (pain score 4-6)., Disp: 30 tablet, Rfl: 0   traZODone  (DESYREL ) 150 MG tablet, Take 150 mg by mouth at bedtime., Disp: , Rfl:    carbamazepine  (TEGRETOL ) 100 MG chewable tablet, Chew 100 mg by mouth 3 (three) times daily., Disp: , Rfl:    carbamazepine  (TEGRETOL ) 100 MG chewable tablet, Chew 1 tablet (100 mg total) by mouth 4 (four) times daily., Disp: 120 tablet, Rfl: 11  PAST MEDICAL HISTORY: Past Medical History:  Diagnosis Date    Anxiety    Bell's palsy    Strep throat 2018    PAST SURGICAL HISTORY: Past Surgical History:  Procedure Laterality Date   CESAREAN SECTION     COLONOSCOPY N/A 12/12/2023   Procedure: COLONOSCOPY;  Surgeon: San Sandor GAILS, DO;  Location: [facility];  Service: Gastroenterology;  Laterality: N/A;   FOOT SURGERY Bilateral    IR TUNNELED CENTRAL VENOUS CATH [facility]  12/07/2023   TONSILLECTOMY      FAMILY HISTORY: Family History  Problem Relation Age of Onset   Diabetes Mother    Diabetes Father     SOCIAL HISTORY: Social History   Socioeconomic History   Marital status: Married    Spouse name: Not on file   Number of children: Not on file   Years of education: Not on file   Highest education level: Not on file  Occupational History   Not on file  Tobacco Use   Smoking status: Former   Smokeless tobacco: Never  Vaping Use   Vaping status: Never Used  Substance and Sexual Activity   Alcohol use: Not Currently    Comment: occasional    Drug use: Not Currently    Types: Marijuana   Sexual activity: Not on file  Other Topics Concern   Not on file  Social History Narrative   Pt lives with family    Pt doesn't work   Social Drivers of Corporate investment banker Strain: Low Risk  (07/30/2023)   Received from [facility]   Overall Financial Resource Strain (CARDIA)    Difficulty of Paying Living Expenses: Not hard at all  Food Insecurity: No Food Insecurity (12/06/2023)   Hunger Vital Sign    Worried About Running Out of Food in the Last Year: Never true    Ran Out of Food in the Last Year: Never true  Transportation Needs: No Transportation Needs (12/06/2023)   PRAPARE - Administrator, Civil Service (Medical): No    Lack of Transportation (Non-Medical): No  Physical Activity: Insufficiently Active (08/13/2018)   Received from [facility]   Exercise Vital Sign    On average, how many days per week do you engage in moderate to  strenuous exercise (like a brisk walk)?: 4 days    On average, how many minutes do you engage in exercise at this level?: 10 min  Stress: Not on file  Social Connections: Unknown (08/13/2018)   Received from [facility]   Social Connection and Isolation Panel    In a typical week, how many times do you talk on the phone with family, friends, or neighbors?: Three times a week    How often do you get together with friends or relatives?: Three times a week    Attends Religious Services: Not on file  Do you belong to any clubs or organizations such as church groups, unions, fraternal or athletic groups, or school groups?: No    How often do you attend meetings of the clubs or organizations you belong to?: Never    Are you married, widowed, divorced, separated, never married, or living with a partner?: Married  Intimate Partner Violence: Not At Risk (12/06/2023)   Humiliation, Afraid, Rape, and Kick questionnaire    Fear of Current or Ex-Partner: No    Emotionally Abused: No    Physically Abused: No    Sexually Abused: No       PHYSICAL EXAM  Vitals:   01/04/24 1113  BP: 92/62  Pulse: 94  Weight: 134 lb (60.8 kg)  Height: 5' 2 (1.575 m)    Body mass index is 24.51 kg/m.   General: The patient is well-developed and well-nourished and in no acute distress  HEENT:  Head is [place]/AT.  Sclera are anicteric.  Funduscopic exam shows normal optic discs and retinal vessels.  Neck: No carotid bruits are noted.  The neck is nontender.  Cardiovascular: The heart has a regular rate and rhythm with a normal S1 and S2. There were no murmurs, gallops or rubs.    Skin: Extremities are without rash or  edema.  Scar over port in right chest  Musculoskeletal:  Back is nontender  Neurologic Exam  Mental status: The patient is alert and oriented x 3 at the time of the examination. The patient has apparent normal recent and remote memory, with an apparently normal attention span and  concentration ability.   Speech is normal.  Cranial nerves: Extraocular movements are full. Pupils are equal, round, and reactive to light and accomodation.  Visual fields are full.  Facial symmetry is present. There is good facial sensation to soft touch bilaterally.Facial strength is normal.  Trapezius and sternocleidomastoid strength is normal. No dysarthria is noted.  The tongue is midline, and the patient has symmetric elevation of the soft palate. No obvious hearing deficits are noted.  Motor:  Muscle bulk is normal.   Tone is normal. Strength is  5 / 5 in all 4 extremities.   Sensory: Sensory testing is intact to pinprick, soft touch and vibration sensation in all 4 extremities.  Coordination: Cerebellar testing reveals good finger-nose-finger and heel-to-shin bilaterally.  Gait and station: Station is normal.   Gait is slightly wide.  Tandem gait is wide.  Romberg is negative.   Reflexes: Deep tendon reflexes are symmetric and 2 in arms and 3 in the legs.  No ankle clonus..   Plantar responses are flexor.    DIAGNOSTIC DATA (LABS, IMAGING, TESTING) - I reviewed patient records, labs, notes, testing and imaging myself where available.  Lab Results  Component Value Date   WBC 7.7 12/20/2023   HGB 9.9 (L) 12/20/2023   HCT 30.2 (L) 12/20/2023   MCV 92.1 12/20/2023   PLT 254 12/20/2023      Component Value Date/Time   NA 141 12/20/2023 0609   K 3.4 (L) 12/20/2023 0609   CL 103 12/20/2023 0609   CO2 27 12/20/2023 0609   GLUCOSE 94 12/20/2023 0609   BUN 7 12/20/2023 0609   CREATININE 0.67 12/20/2023 0609   CALCIUM  8.4 (L) 12/20/2023 0609   PROT 5.3 (L) 12/15/2023 0154   ALBUMIN  3.4 (L) 12/15/2023 0154   ALBUMIN  4.3 12/08/2023 1331   AST 29 12/15/2023 0154   ALT 47 (H) 12/15/2023 0154   ALKPHOS 36 (L) 12/15/2023 0154  BILITOT 0.6 12/15/2023 0154   GFRNONAA >60 12/20/2023 0609   GFRAA >60 09/30/2018 1308       ASSESSMENT AND PLAN  Demyelinating disease (HCC) -  Plan: Angiotensin converting enzyme, Carbamazepine  level, total, Comprehensive metabolic panel with GFR, Comprehensive metabolic panel with GFR, Carbamazepine  level, total, Multiple Myeloma Panel (SPEP&IFE w/QIG), Vitamin B12, Vitamin B12, Multiple Myeloma Panel (SPEP&IFE w/QIG)  Transverse myelitis (HCC) - Plan: Angiotensin converting enzyme  High risk medication use - Plan: Carbamazepine  level, total, Comprehensive metabolic panel with GFR, Comprehensive metabolic panel with GFR, Carbamazepine  level, total  Dysesthesia - Plan: Carbamazepine  level, total, Comprehensive metabolic panel with GFR, Comprehensive metabolic panel with GFR, Carbamazepine  level, total, Multiple Myeloma Panel (SPEP&IFE w/QIG), Vitamin B12, Vitamin B12, Multiple Myeloma Panel (SPEP&IFE w/QIG)    In summary, Patricia Patton is a 53 year old woman who presented with a longitudinally extensive transverse myelitis in September 2025.  Enhancement was seen peripherally at the cervicomedullary junction, more in the medulla on the right and more in the spinal cord of the left.  MRI appearance was greatly improved a couple weeks later.  She continues to have dysesthesias and mildly ataxic gait  A very thorough evaluation was performed.  Cell-based antiaquaporin 4 and anti-MOG IgG were negative.  ANCA and ANA were negative.  Although neurosarcoidosis would be unlikely with a normal chest CT scan, I will go ahead and check an angiotensin-converting enzyme.  We will try to optimize her pain control better.  I will increase the carbamazepine  to 400 mg a day and we will check a drug level in 10 to 14 days.  Tegretol  may be titrated higher.  Based on response, could also consider other centrally acting agents such as lamotrigine , lacosamide or increase duloxetine  to 120 mg  She will return to see me in 3 months.  At that time, I would likely want to check another MRI of the cervical spine to determine how the changes at the cervicomedullary  junction have evolved and also recheck antiaquaporin 4 and anti-MOG antibodies.  We did discuss that I believe it is most likely that she had a monophasic illness because the evaluation was negative.  However, if a second episode occurs or based on repeat testing, then I would consider an immunosuppressive agent such as rituximab.  They will call sooner if there are new or worsening neurologic symptoms.  Thank you for asking me to see this patient.  Please let me know if I can be of further assistance with her or other patients in the future.     Jerrad Mendibles A. Vear, MD, [facility] 01/04/2024, 12:39 PM Certified in Neurology, Clinical Neurophysiology, Sleep Medicine and Neuroimaging  [facility] [address] [place], [place] [address] [phone]

## 2024-01-04 ENCOUNTER — Ambulatory Visit (INDEPENDENT_AMBULATORY_CARE_PROVIDER_SITE_OTHER): Admitting: Neurology

## 2024-01-04 ENCOUNTER — Encounter: Payer: Self-pay | Admitting: Neurology

## 2024-01-04 VITALS — BP 92/62 | HR 94 | Ht 62.0 in | Wt 134.0 lb

## 2024-01-04 DIAGNOSIS — Z79899 Other long term (current) drug therapy: Secondary | ICD-10-CM

## 2024-01-04 DIAGNOSIS — G373 Acute transverse myelitis in demyelinating disease of central nervous system: Secondary | ICD-10-CM | POA: Diagnosis not present

## 2024-01-04 DIAGNOSIS — G379 Demyelinating disease of central nervous system, unspecified: Secondary | ICD-10-CM | POA: Diagnosis not present

## 2024-01-04 DIAGNOSIS — R208 Other disturbances of skin sensation: Secondary | ICD-10-CM

## 2024-01-04 MED ORDER — CARBAMAZEPINE 100 MG PO CHEW: 100.0000 mg | CHEWABLE_TABLET | Freq: Four times a day (QID) | ORAL | 11 refills | Status: AC

## 2024-01-05 LAB — ANGIOTENSIN CONVERTING ENZYME: Angio Convert Enzyme: 33 U/L (ref 14–82)

## 2024-01-08 DIAGNOSIS — G373 Acute transverse myelitis in demyelinating disease of central nervous system: Secondary | ICD-10-CM | POA: Insufficient documentation

## 2024-01-10 ENCOUNTER — Telehealth: Payer: Self-pay | Admitting: Neurology

## 2024-01-10 NOTE — Telephone Encounter (Signed)
 Call to check status of refill request from 01/04/24

## 2024-01-17 ENCOUNTER — Other Ambulatory Visit (HOSPITAL_COMMUNITY): Payer: Self-pay

## 2024-01-17 ENCOUNTER — Telehealth: Payer: Self-pay | Admitting: Neurology

## 2024-01-17 ENCOUNTER — Telehealth: Payer: Self-pay

## 2024-01-17 ENCOUNTER — Other Ambulatory Visit: Payer: Self-pay | Admitting: Neurology

## 2024-01-17 MED ORDER — GABAPENTIN 300 MG PO CAPS: 900.0000 mg | ORAL_CAPSULE | Freq: Four times a day (QID) | ORAL | 11 refills | Status: DC

## 2024-01-17 NOTE — Telephone Encounter (Signed)
 Pt called stating that she was informed that the gabapentin  (NEURONTIN ) 300 MG capsule that was prescribed to her is needing a PA. Please advise.

## 2024-01-17 NOTE — Telephone Encounter (Signed)
 I do not see a script written by Dr. Vear or [facility]-the one in the chart is from a provider that is not [facility]-I can proceed with PA if RX is updated reflecting prescriber from [facility]. Thanks.

## 2024-01-18 ENCOUNTER — Other Ambulatory Visit (HOSPITAL_COMMUNITY): Payer: Self-pay

## 2024-01-18 NOTE — Telephone Encounter (Signed)
 Pharmacy Patient Advocate Encounter  Received notification from [facility] that Prior Authorization for Gabapentin  300mg  capsules has been APPROVED from 01/18/2024 to 01/17/2025. Ran test claim, Copay is $0. This test claim was processed through [facility]- copay amounts may vary at other pharmacies due to pharmacy/plan contracts, or as the patient moves through the different stages of their insurance plan.   PA #/Case ID/Reference #: [id number]

## 2024-01-18 NOTE — Telephone Encounter (Signed)
 Pharmacy Patient Advocate Encounter   Received notification from Patient Advice Request messages that prior authorization for Gabapentin  300mg  Capsules is required/requested.   Insurance verification completed.   The patient is insured through [employer].   Per test claim: PA required; PA submitted to above mentioned insurance via Latent Key/confirmation #/EOC [facility] Status is pending

## 2024-01-19 LAB — COMPREHENSIVE METABOLIC PANEL WITH GFR
ALT: 53 IU/L — ABNORMAL HIGH (ref 0–32)
AST: 28 IU/L (ref 0–40)
Albumin: 4.5 g/dL (ref 3.8–4.9)
Alkaline Phosphatase: 98 IU/L (ref 49–135)
BUN/Creatinine Ratio: 16 (ref 9–23)
BUN: 11 mg/dL (ref 6–24)
Bilirubin Total: <0.2 mg/dL (ref 0.0–1.2)
CO2: 27 mmol/L (ref 20–29)
Calcium: 9.3 mg/dL (ref 8.7–10.2)
Chloride: 102 mmol/L (ref 96–106)
Creatinine, Ser: 0.69 mg/dL (ref 0.57–1.00)
Globulin, Total: 1.9 g/dL (ref 1.5–4.5)
Glucose: 82 mg/dL (ref 70–99)
Potassium: 4.1 mmol/L (ref 3.5–5.2)
Sodium: 143 mmol/L (ref 134–144)
Total Protein: 6.4 g/dL (ref 6.0–8.5)
eGFR: 104 mL/min/1.73 (ref >59)

## 2024-01-19 LAB — VITAMIN B12: Vitamin B-12: 318 pg/mL (ref 232–1245)

## 2024-01-19 LAB — CARBAMAZEPINE LEVEL, TOTAL: Carbamazepine (Tegretol), S: 9.9 ug/mL (ref 4.0–12.0)

## 2024-01-24 ENCOUNTER — Ambulatory Visit: Payer: Self-pay | Admitting: Neurology

## 2024-01-24 LAB — MULTIPLE MYELOMA PANEL, SERUM
Albumin SerPl Elph-Mcnc: 3.9 g/dL (ref 2.9–4.4)
Albumin/Glob SerPl: 1.5 (ref 0.7–1.7)
Alpha 1: 0.3 g/dL (ref 0.0–0.4)
Alpha2 Glob SerPl Elph-Mcnc: 0.7 g/dL (ref 0.4–1.0)
B-Globulin SerPl Elph-Mcnc: 1.1 g/dL (ref 0.7–1.3)
Gamma Glob SerPl Elph-Mcnc: 0.6 g/dL (ref 0.4–1.8)
Globulin, Total: 2.7 g/dL (ref 2.2–3.9)
IgG (Immunoglobin G), Serum: 602 mg/dL (ref 586–1602)
IgM (Immunoglobulin M), Srm: 29 mg/dL (ref 26–217)
Immunoglobulin A, (IgA) QN, Serum: 182 mg/dL (ref 87–352)
Total Protein: 6.6 g/dL (ref 6.0–8.5)

## 2024-01-28 LAB — MISC LABCORP TEST (SEND OUT): Labcorp test code: 520301

## 2024-02-28 ENCOUNTER — Encounter: Payer: Self-pay | Admitting: Neurology

## 2024-03-25 ENCOUNTER — Ambulatory Visit: Payer: Self-pay | Admitting: Neurology

## 2024-03-25 ENCOUNTER — Encounter: Payer: Self-pay | Admitting: Neurology

## 2024-03-25 VITALS — BP 111/76 | HR 76 | Ht 62.0 in | Wt 142.5 lb

## 2024-03-25 DIAGNOSIS — G373 Acute transverse myelitis in demyelinating disease of central nervous system: Secondary | ICD-10-CM

## 2024-03-25 DIAGNOSIS — G629 Polyneuropathy, unspecified: Secondary | ICD-10-CM | POA: Diagnosis not present

## 2024-03-25 DIAGNOSIS — G379 Demyelinating disease of central nervous system, unspecified: Secondary | ICD-10-CM | POA: Diagnosis not present

## 2024-03-25 DIAGNOSIS — R208 Other disturbances of skin sensation: Secondary | ICD-10-CM | POA: Diagnosis not present

## 2024-03-25 DIAGNOSIS — F0630 Mood disorder due to known physiological condition, unspecified: Secondary | ICD-10-CM

## 2024-03-25 MED ORDER — GABAPENTIN 300 MG PO CAPS: 900.0000 mg | ORAL_CAPSULE | Freq: Four times a day (QID) | ORAL | 11 refills | Status: AC

## 2024-03-25 MED ORDER — LAMOTRIGINE 100 MG PO TABS: 100.0000 mg | ORAL_TABLET | Freq: Two times a day (BID) | ORAL | 11 refills | Status: AC

## 2024-03-25 NOTE — Progress Notes (Signed)
 "  [facility]  PATIENT: Patricia Patton DOB: [birth year]  REFERRING DOCTOR OR PCP: Elida Ross, MD; Rayfield Sharps, MD SOURCE: Patient, notes from multiple emergency room visits and hospital stay, imaging and lab reports, multiple MRIs personally reviewed.  _________________________________   HISTORICAL  CHIEF COMPLAINT:  Chief Complaint  Patient presents with   RM11/Demyelinating Disease    Pt is here with her Husband and Daughter. Pt states that everything has been a hit or miss lately. Pt states that the top of her head and the corner of her right eye burn and her eye twitch. Pt states that her eye twitch is everyday all day. Pt states that her ears hurts.     HISTORY OF PRESENT ILLNESS:  Patricia Patton is a 54 y.o. 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  Gait has improved.   Pain is still significant despite CBZ 200 mg qid and Gabapentin  900 mg qid.  Also on duloxetine   Currently gait is not at baseline but she could walk 20 minutes without a rest.   She needs the bannister on stairs.      Strength is near baseline - probably normal though fatigues more easily.   Bladder function is fine.      Vision is baseline.    Her worse pain is near the eye.  She has allodynia over the right scalp.   Sensation  is better in limbs.  She feels she has not improved much more compared to a couple months ago but her husband feels that there was noticeable improvement though she is not at baseline.  She has had some depression.    History of LETM She first presented to the emergency room on 11/27/2023 with a 6-week history of progressively worsening scalp pain, initially right-sided and then bilateral.   Before the hospital stay, she had been seen several times and was not helped by gabapentin , Valtrex, oxycodone  or NSAIDs.  Pain was in the bilateral forehead and temples.  There was no  rash.  She was felt to have trigeminal neuralgia and treated with analgesics and Carbamezapine.  She had mild ataxia.  No numbness or weakness in linbas and no bladder difficulty.  She had mild right facial weakness.  .  She returned to the emergency room on 12/05/2023 due to worsening pain - predominantly severe right sided facial pain like a knife leaving her bedridden.  MRI of the brain showed abnormal signal near the cervicomedullary junction.  MRI of the spinal cord showed a longitudinally extensive transverse myelitis from the medulla to C6 and she was sent to [facility] for further evaluation and treatment.  Additional blood work including an autoimmune myelopathy panel, ANA, ANCA, ENA, was negative.  CSF was obtained and showed a small number of white blood cells, mostly lymphocytes but normal protein.  Oligoclonal bands were normal.  IgG index was elevated.  She was treated with 5 days of plasmapheresis and steroids.  With these treatments, pain improved and she was discharged 12/21/2023.  She continues to experience right facial pain, thugh less than last month.   Pain increases with some movements.  With the current medications she feels the intensity of the pain is tolerable.  She notes mild reduced balance and uses the banister on stairs.  She denies numbness or weakness of the limbs.  No bladder or bowel issues.  Currently, she is currently on gabapentin  900 mg po qid and baclofen  10 mg  qid, CBZ 100 mg tid and duloxetine  60 mg every day.  She tolerates these medications well.  She does not recall an antecedent viral infection or vaccination in the couple weeks before her symptoms started.  Around early 2024 she was diagnosed with small fiber polyneuropathy based on EMG and skin biopsy in May 2024.  She had been tried a medication such as nortriptyline, duloxetine  and gabapentin ..  Antidouble-stranded DNA was elevated and she had seen rheumatology.  In early 2024, vitamin B12, thiamine and  hemoglobin A1c were fine.  An SPEP had not shown any paraprotein  Of note she was on [facility] in June but rapidly lost 25 pounds in 3 weeks so stopped and she felt weaker.  She switched to Zepbound for short period of time.  Weight was stable and diet was typical around the time of the onset of symptoms in the previous month.     Imaging: MRI of the head 12/05/2023 showed T2/FLAIR hyperintense changes at the cervicomedullary junction, right greater than left.  There is enhancement along the right medulla and the left cervicomedullary junction and upper cervical spinal cord.SABRA    MRI of the cervical spine 12/06/2023 shows a longitudinally extensive transverse myelitis from the medulla to C6.  Enhancement is noted peripherally at the cervicomedullary junction and adjacent to C2-C3 on the left and in the lower medulla on the right  MRI of the orbits 12/06/2023 was normal.  MRI of the thoracic and lumbar spine 12/06/2023 was normal.  No evidence of CNS process.  MRI of the head and cervical spine 12/18/2023 showed improvement compared to the 12/05/2023 and 12/06/2023 MRIs.  There are still small T2 hyperintense foci posterolaterally to the left and to the right at the cervicomedullary junction.  The focus on the right was more adjacent to the medulla while it was more adjacent to C1-C2 on the left.  There was enhancement after contrast administration.  The brain was otherwise normal for age with just a couple punctate T2/FLAIR hyperintense foci in the subcortical or deep white matter.     Laboratory: CSF 12/08/2023 showed no oligoclonal bands though the IgG index was elevated at 1.2.  Protein was normal.  White blood cell count was mildly elevated at 17 (90% lymphocytes) meningitis/encephalitis panel (including HSV 1/2/6) were negative  12/07/2023: ANA was negative, ENA was negative, ANCA was negative, HIV antibody was negative, , ESR/CRP was normal. Following antibodies were negative on 12/07/2023:   anti-aquaporin 4 , anti-MOG, NMDA receptor, GABA-B receptor, Ampyra receptor and mGluR1,AGNA-1, CRMP-5,Amphiphysin, Purkinje cell cytoplasmic, anti-hu.   REVIEW OF SYSTEMS: Constitutional: No fevers, chills, sweats, or change in appetite Eyes: No visual changes, double vision, eye pain Ear, nose and throat: No hearing loss, ear pain, nasal congestion, sore throat Cardiovascular: No chest pain, palpitations Respiratory:  No shortness of breath at rest or with exertion.   No wheezes GastrointestinaI: No nausea, vomiting, diarrhea, abdominal pain, fecal incontinence Genitourinary:  No dysuria, urinary retention or frequency.  No nocturia. Musculoskeletal:  No neck pain, back pain Integumentary: No rash, pruritus, skin lesions Neurological: as above Psychiatric: No depression at this time.  No anxiety Endocrine: No palpitations, diaphoresis, change in appetite, change in weigh or increased thirst Hematologic/Lymphatic:  No anemia, purpura, petechiae. Allergic/Immunologic: No itchy/runny eyes, nasal congestion, recent allergic reactions, rashes  ALLERGIES: Allergies  Allergen Reactions   Amoxicillin-Pot Clavulanate Hives and Swelling    Specifically Augmentin     HOME MEDICATIONS:  Current Outpatient Medications:    acetaminophen  (TYLENOL )  325 MG tablet, Take 2 tablets (650 mg total) by mouth every 4 (four) hours as needed for mild pain (pain score 1-3), fever or headache., Disp: , Rfl:    baclofen  (LIORESAL ) 10 MG tablet, Take 0.5-1 tablets (5-10 mg total) by mouth 3 (three) times daily. If you are still having pain from trigeminal neuralgia you may increase baclofen  to 10mg  three times a day. If you are not still having severe pain, break the 10mg  tablets in half and continue taking 5mg  three times a day (half a pill), Disp: 30 tablet, Rfl: 1   carbamazepine  (TEGRETOL ) 100 MG chewable tablet, Chew 1 tablet (100 mg total) by mouth 4 (four) times daily., Disp: 120 tablet, Rfl: 11    DULoxetine  (CYMBALTA ) 60 MG capsule, Take 60 mg by mouth daily., Disp: , Rfl:    lamoTRIgine  (LAMICTAL ) 100 MG tablet, Take 1 tablet (100 mg total) by mouth 2 (two) times daily., Disp: 60 tablet, Rfl: 11   traZODone  (DESYREL ) 150 MG tablet, Take 150 mg by mouth at bedtime., Disp: , Rfl:    carbamazepine  (TEGRETOL ) 100 MG chewable tablet, Chew 100 mg by mouth 3 (three) times daily. (Patient not taking: Reported on 03/25/2024), Disp: , Rfl:    gabapentin  (NEURONTIN ) 300 MG capsule, Take 3 capsules (900 mg total) by mouth 4 (four) times daily., Disp: 360 capsule, Rfl: 11   ondansetron  (ZOFRAN ) 4 MG tablet, Take 1 tablet (4 mg total) by mouth every 8 (eight) hours as needed for nausea or vomiting. (Patient not taking: Reported on 03/25/2024), Disp: 20 tablet, Rfl: 0   oxyCODONE  10 MG TABS, Take 1 tablet (10 mg total) by mouth every 4 (four) hours as needed for severe pain (pain score 7-10) or moderate pain (pain score 4-6). (Patient not taking: Reported on 03/25/2024), Disp: 30 tablet, Rfl: 0  PAST MEDICAL HISTORY: Past Medical History:  Diagnosis Date   Anxiety    Bell's palsy    Strep throat 2018    PAST SURGICAL HISTORY: Past Surgical History:  Procedure Laterality Date   CESAREAN SECTION     COLONOSCOPY N/A 12/12/2023   Procedure: COLONOSCOPY;  Surgeon: [place] Sandor GAILS, DO;  Location: [facility] ENDOSCOPY;  Service: Gastroenterology;  Laterality: N/A;   FOOT SURGERY Bilateral    IR TUNNELED CENTRAL VENOUS CATH [facility] W IMG  12/07/2023   TONSILLECTOMY      FAMILY HISTORY: Family History  Problem Relation Age of Onset   Diabetes Mother    Diabetes Father     SOCIAL HISTORY: Social History   Socioeconomic History   Marital status: Married    Spouse name: Not on file   Number of children: Not on file   Years of education: Not on file   Highest education level: Not on file  Occupational History   Not on file  Tobacco Use   Smoking status: Former   Smokeless tobacco: Never  Vaping Use    Vaping status: Never Used  Substance and Sexual Activity   Alcohol  use: Not Currently    Comment: occasional    Drug use: Not Currently    Types: Marijuana   Sexual activity: Not on file  Other Topics Concern   Not on file  Social History Narrative   Pt lives with family    Pt doesn't work   Social Drivers of Health   Tobacco Use: Medium Risk (03/25/2024)   Patient History    Smoking Tobacco Use: Former    Smokeless Tobacco Use: Never  Passive Exposure: Not on file  Financial Resource Strain: Low Risk  (07/30/2023)   Received from [facility]   Overall Financial Resource Strain (CARDIA)    Difficulty of Paying Living Expenses: Not hard at all  Food Insecurity: No Food Insecurity (12/06/2023)   Epic    Worried About Running Out of Food in the Last Year: Never true    Ran Out of Food in the Last Year: Never true  Transportation Needs: No Transportation Needs (12/06/2023)   Epic    Lack of Transportation (Medical): No    Lack of Transportation (Non-Medical): No  Physical Activity: Not on file  Stress: Not on file  Social Connections: Not on file  Intimate Partner Violence: Not At Risk (12/06/2023)   Epic    Fear of Current or Ex-Partner: No    Emotionally Abused: No    Physically Abused: No    Sexually Abused: No  Depression (PHQ2-9): Not on file  Alcohol  Screen: Not on file  Housing: Low Risk (12/06/2023)   Epic    Unable to Pay for Housing in the Last Year: No    Number of Times Moved in the Last Year: 0    Homeless in the Last Year: No  Utilities: Not At Risk (12/06/2023)   Epic    Threatened with loss of utilities: No  Health Literacy: Not on file       PHYSICAL EXAM  Vitals:   03/25/24 1355  BP: 111/76  Pulse: 76  Weight: 142 lb 8 oz (64.6 kg)  Height: 5' 2 (1.575 m)    Body mass index is 26.06 kg/m.   General: The patient is well-developed and well-nourished and in no acute distress  HEENT:  Head is [place]/AT.  Sclera are  anicteric.    Skin: Extremities are without rash or  edema.    Neurologic Exam  Mental status: The patient is alert and oriented x 3 at the time of the examination. The patient has apparent normal recent and remote memory, with an apparently normal attention span and concentration ability.   Speech is normal.  Cranial nerves: Extraocular movements are full.  Facial sensation was symmetric to temperature though to touch she had allodynia in the forehead and scalp..Facial strength is normal.  Trapezius and sternocleidomastoid strength is normal. No dysarthria is noted.  The tongue is midline, and the patient has symmetric elevation of the soft palate. No obvious hearing deficits are noted.  Motor:  Muscle bulk is normal.   Tone is normal. Strength is  5 / 5 in all 4 extremities.   Sensory: Sensory testing is intact to pinprick, soft touch and vibration sensation in all 4 extremities.  Coordination: Cerebellar testing reveals good finger-nose-finger and heel-to-shin bilaterally.  Gait and station: Station is normal.   Her gait is slightly wide.  The tandem gait is wide.  Romberg is negative.   Reflexes: Deep tendon reflexes are symmetric and 2 in arms and 3 in the legs.  No ankle clonus.SABRA    DIAGNOSTIC DATA (LABS, IMAGING, TESTING) - I reviewed patient records, labs, notes, testing and imaging myself where available.  Lab Results  Component Value Date   WBC 7.7 12/20/2023   HGB 9.9 (L) 12/20/2023   HCT 30.2 (L) 12/20/2023   MCV 92.1 12/20/2023   PLT 254 12/20/2023      Component Value Date/Time   NA 143 01/18/2024 1337   K 4.1 01/18/2024 1337   CL 102 01/18/2024 1337   CO2  27 01/18/2024 1337   GLUCOSE 82 01/18/2024 1337   GLUCOSE 94 12/20/2023 0609   BUN 11 01/18/2024 1337   CREATININE 0.69 01/18/2024 1337   CALCIUM  9.3 01/18/2024 1337   PROT 6.4 01/18/2024 1337   ALBUMIN  4.5 01/18/2024 1337   AST 28 01/18/2024 1337   ALT 53 (H) 01/18/2024 1337   ALKPHOS 98 01/18/2024  1337   BILITOT <0.2 01/18/2024 1337   GFRNONAA >60 12/20/2023 0609   GFRAA >60 09/30/2018 1308       ASSESSMENT AND PLAN  CNS demyelinating disease (HCC) - Plan: IgG 1, 2, 3, and 4, Anti-MOG, Serum, Anti-Aquaporin (AQP4), Serum, MR BRAIN W WO CONTRAST, MR CERVICAL SPINE W WO CONTRAST  Transverse myelitis (HCC) - Plan: IgG 1, 2, 3, and 4, Anti-MOG, Serum, Anti-Aquaporin (AQP4), Serum, MR BRAIN W WO CONTRAST, MR CERVICAL SPINE W WO CONTRAST  Dysesthesia  Small fiber neuropathy  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  Although she has not had any new neurologic symptoms she continues to experience dysesthesias in the right face more than the limbs.  We will continue gabapentin .  I am going to change the carbamazepine  to lamotrigine  to see if that controls her pain better.  She will continue duloxetine  as well.  If pain is not much better consider adding tramadol.  We will recheck an MRI of the brain and cervical spine to ensure that there is not progression and to evaluate the enhancement that was noted on the previous MRI.  She also has a history of small fiber polyneuropathy in 2024 before her other symptoms.  Not clear if this is related to her other symptoms or a separate issue. Etiology of the demyelination is not clear.  Antibodies were negative for NMOSD and MOGAD.  We also checked ANCA and ACE.  Because of the enhancement pattern I am going to recheck the anti-MOG and also check for IgG4 related disorder. Stay active and exercise as tolerated. She will return in 6 months or sooner if there are new or worsening neurologic symptoms.     Tylique Aull A. Vear, MD, [facility] 03/25/2024, 7:39 PM Certified in Neurology, Clinical Neurophysiology, Sleep Medicine and Neuroimaging  [facility] [address], [address] [place], [place] [address] [phone] "

## 2024-03-25 NOTE — Patient Instructions (Signed)
 We are going to be switching you from carbamazepine  to lamotrigine .    The pharmacy has the prescription for lamotrigine  100 mg.  For 5 days, take 1/2 tablet of lamotrigine  daily and continue your carbamazepine  4 times a day  For the next 5 days take 1/2 tablet of lamotrigine  twice a day and cut the carbamazepine  to 3 times a day  For the next 5 days, take 1/2 tablet of lamotrigine  in the morning and 1 tablet at night.  Cut the carbamazepine  to 2 times a day  For the next 5 days take 1 tablet of lamotrigine  twice a day and cut the carbamazepine  to 1 time a day  Then take the lamotrigine  only twice a day

## 2024-03-27 ENCOUNTER — Ambulatory Visit: Payer: Self-pay | Admitting: Neurology

## 2024-03-27 LAB — IGG 1, 2, 3, AND 4
IgG (Immunoglobin G), Serum: 716 mg/dL (ref 586–1602)
IgG, Subclass 1: 348 mg/dL (ref 248–810)
IgG, Subclass 2: 251 mg/dL (ref 130–555)
IgG, Subclass 3: 20 mg/dL (ref 15–102)
IgG, Subclass 4: 16 mg/dL (ref 2–96)

## 2024-03-27 LAB — ANTI-AQUAPORIN (AQP4), SERUM

## 2024-03-27 LAB — ANTI-MOG, SERUM: MOG Antibody, Cell-based IFA: NEGATIVE

## 2024-04-05 ENCOUNTER — Other Ambulatory Visit: Payer: Self-pay | Admitting: Neurology

## 2024-04-05 MED ORDER — BACLOFEN 10 MG PO TABS: ORAL_TABLET | ORAL | 3 refills | Status: AC

## 2024-04-09 ENCOUNTER — Ambulatory Visit: Admitting: Neurology

## 2024-04-09 ENCOUNTER — Ambulatory Visit

## 2024-04-09 DIAGNOSIS — G373 Acute transverse myelitis in demyelinating disease of central nervous system: Secondary | ICD-10-CM | POA: Diagnosis not present

## 2024-04-09 DIAGNOSIS — G379 Demyelinating disease of central nervous system, unspecified: Secondary | ICD-10-CM | POA: Diagnosis not present

## 2024-04-09 MED ORDER — GADOBENATE DIMEGLUMINE 529 MG/ML IV SOLN
13.0000 mL | Freq: Once | INTRAVENOUS | Status: AC | PRN
  Administered 2024-04-09: 13 mL via INTRAVENOUS

## 2024-04-19 ENCOUNTER — Other Ambulatory Visit: Payer: Self-pay | Admitting: Neurology

## 2024-04-19 ENCOUNTER — Encounter: Payer: Self-pay | Admitting: Neurology

## 2024-04-19 MED ORDER — TRAMADOL HCL 50 MG PO TABS: 50.0000 mg | ORAL_TABLET | Freq: Four times a day (QID) | ORAL | 3 refills | Status: AC | PRN

## 2024-12-09 ENCOUNTER — Ambulatory Visit: Admitting: Neurology
